# Patient Record
Sex: Male | Born: 1950 | Race: White | Hispanic: No | Marital: Married | Smoking: Former smoker
Health system: Southern US, Community
[De-identification: ages and names within clinical notes are randomized; demographics above are authoritative.]

## PROBLEM LIST (undated history)

## (undated) DIAGNOSIS — T7840XA Allergy, unspecified, initial encounter: Secondary | ICD-10-CM

## (undated) DIAGNOSIS — C4491 Basal cell carcinoma of skin, unspecified: Secondary | ICD-10-CM

## (undated) DIAGNOSIS — E785 Hyperlipidemia, unspecified: Secondary | ICD-10-CM

## (undated) DIAGNOSIS — M1990 Unspecified osteoarthritis, unspecified site: Secondary | ICD-10-CM

## (undated) DIAGNOSIS — N529 Male erectile dysfunction, unspecified: Secondary | ICD-10-CM

## (undated) DIAGNOSIS — I10 Essential (primary) hypertension: Secondary | ICD-10-CM

## (undated) HISTORY — DX: Essential (primary) hypertension: I10

## (undated) HISTORY — PX: SPINE SURGERY: SHX786

## (undated) HISTORY — DX: Male erectile dysfunction, unspecified: N52.9

## (undated) HISTORY — DX: Allergy, unspecified, initial encounter: T78.40XA

## (undated) HISTORY — DX: Basal cell carcinoma of skin, unspecified: C44.91

## (undated) HISTORY — DX: Hyperlipidemia, unspecified: E78.5

## (undated) HISTORY — PX: KNEE SURGERY: SHX244

## (undated) HISTORY — PX: OTHER SURGICAL HISTORY: SHX169

## (undated) HISTORY — PX: LUMBAR DISC SURGERY: SHX700

## (undated) HISTORY — DX: Unspecified osteoarthritis, unspecified site: M19.90

---

## 1997-05-07 ENCOUNTER — Ambulatory Visit (HOSPITAL_BASED_OUTPATIENT_CLINIC_OR_DEPARTMENT_OTHER): Admission: RE | Admit: 1997-05-07 | Discharge: 1997-05-07 | Payer: Self-pay | Admitting: *Deleted

## 1998-01-14 ENCOUNTER — Emergency Department (HOSPITAL_COMMUNITY): Admission: EM | Admit: 1998-01-14 | Discharge: 1998-01-14 | Payer: Self-pay | Admitting: Emergency Medicine

## 1998-01-15 ENCOUNTER — Encounter: Payer: Self-pay | Admitting: Emergency Medicine

## 2002-03-02 ENCOUNTER — Emergency Department (HOSPITAL_COMMUNITY): Admission: EM | Admit: 2002-03-02 | Discharge: 2002-03-02 | Payer: Self-pay | Admitting: Emergency Medicine

## 2011-04-30 ENCOUNTER — Ambulatory Visit (INDEPENDENT_AMBULATORY_CARE_PROVIDER_SITE_OTHER): Payer: 59 | Admitting: Internal Medicine

## 2011-04-30 VITALS — BP 166/89 | HR 67 | Temp 98.6°F | Resp 16 | Ht 75.0 in | Wt 230.0 lb

## 2011-04-30 DIAGNOSIS — IMO0001 Reserved for inherently not codable concepts without codable children: Secondary | ICD-10-CM

## 2011-04-30 DIAGNOSIS — R030 Elevated blood-pressure reading, without diagnosis of hypertension: Secondary | ICD-10-CM

## 2011-04-30 DIAGNOSIS — N529 Male erectile dysfunction, unspecified: Secondary | ICD-10-CM

## 2011-04-30 DIAGNOSIS — R635 Abnormal weight gain: Secondary | ICD-10-CM

## 2011-04-30 LAB — POCT CBC
Granulocyte percent: 62.8 %G (ref 37–80)
HCT, POC: 49 % (ref 43.5–53.7)
Hemoglobin: 16.1 g/dL (ref 14.1–18.1)
Lymph, poc: 2 (ref 0.6–3.4)
MCH, POC: 31.4 pg — AB (ref 27–31.2)
MCHC: 32.9 g/dL (ref 31.8–35.4)
MCV: 95.6 fL (ref 80–97)
MID (cbc): 0.6 (ref 0–0.9)
MPV: 8.2 fL (ref 0–99.8)
POC Granulocyte: 4.3 (ref 2–6.9)
POC LYMPH PERCENT: 28.7 %L (ref 10–50)
POC MID %: 8.5 %M (ref 0–12)
Platelet Count, POC: 273 10*3/uL (ref 142–424)
RBC: 5.13 M/uL (ref 4.69–6.13)
RDW, POC: 13.2 %
WBC: 6.9 10*3/uL (ref 4.6–10.2)

## 2011-04-30 LAB — IFOBT (OCCULT BLOOD): IFOBT: NEGATIVE

## 2011-04-30 MED ORDER — SILDENAFIL CITRATE 100 MG PO TABS
50.0000 mg | ORAL_TABLET | Freq: Every day | ORAL | Status: DC | PRN
Start: 2011-04-30 — End: 2012-03-11

## 2011-04-30 NOTE — Progress Notes (Signed)
Subjective:    Patient ID: Joshua Levine, male    DOB: 06/04/1950, 61 y.o.   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  HENT: Negative.   Eyes: Negative.   Respiratory: Negative.   Cardiovascular: Negative.   Gastrointestinal: Negative.   Genitourinary: Negative.        Nocturia one or 2 times per night No flow change  Musculoskeletal: Negative.   Skin: Negative.   Neurological: Negative.   Hematological: Negative.   Psychiatric/Behavioral: Positive for dysphoric mood. Negative for suicidal ideas and sleep disturbance. The patient is not nervous/anxious.       Family history:  Father diagnosed with prostate cancer in his 58s Objective:   Physical Exam  Constitutional: He is oriented to person, place, and time. He appears well-developed and well-nourished.  Neck: No thyromegaly present.  Cardiovascular: Normal rate, regular rhythm, normal heart sounds and intact distal pulses.   No murmur heard. Pulmonary/Chest: Effort normal and breath sounds normal.  Genitourinary: Rectum normal, prostate normal and penis normal.       No testicular atrophy Prostate is smooth and symmetrical  Lymphadenopathy:    He has no cervical adenopathy.  Neurological: He is alert and oriented to person, place, and time. No cranial nerve deficit.  Psychiatric: He has a  normal mood and affect. His behavior is normal. Judgment and thought content normal.      Results for orders placed in visit on 04/30/11  POCT CBC      Component Value Range   WBC 6.9  4.6 - 10.2 (K/uL)   Lymph, poc 2.0  0.6 - 3.4    POC LYMPH PERCENT 28.7  10 - 50 (%L)   MID (cbc) 0.6  0 - 0.9    POC MID % 8.5  0 - 12 (%M)   POC Granulocyte 4.3  2 - 6.9    Granulocyte percent 62.8  37 - 80 (%G)   RBC 5.13  4.69 - 6.13 (M/uL)   Hemoglobin 16.1  14.1 - 18.1 (g/dL)   HCT, POC 13.0  86.5 - 53.7 (%)   MCV 95.6  80 - 97 (fL)   MCH, POC 31.4 (*) 27 - 31.2 (pg)   MCHC 32.9  31.8 - 35.4 (g/dL)   RDW, POC 78.4     Platelet Count, POC 273  142 - 424 (K/uL)   MPV 8.2  0 - 99.8 (fL)  Hemoccult=Negative     Assessment & Plan:  Problem #1 erectile dysfunction Problem #2 recent weight gain Problem #3 increased blood pressure without diagnosis of hypertension #4 mild overweight Problem #5 dysthymia  Plan Metabolic evaluation for Risk factors Trial of Viagra 100 mg #511 refills 1/4-1/2 to one Notify labs and followup Will need outside blood pressures Refer to psychologist Karmen Bongo Needs to consider followup for all the other health parameters for his age including  colonoscopy #1

## 2011-04-30 NOTE — Progress Notes (Signed)
Addended by: Tonye Pearson on: 04/30/2011 04:38 PM   Modules accepted: Level of Service

## 2011-05-01 LAB — COMPREHENSIVE METABOLIC PANEL
ALT: 28 U/L (ref 0–53)
AST: 25 U/L (ref 0–37)
Albumin: 4.9 g/dL (ref 3.5–5.2)
Alkaline Phosphatase: 79 U/L (ref 39–117)
BUN: 19 mg/dL (ref 6–23)
CO2: 27 mEq/L (ref 19–32)
Calcium: 9.8 mg/dL (ref 8.4–10.5)
Chloride: 104 mEq/L (ref 96–112)
Creat: 0.96 mg/dL (ref 0.50–1.35)
Glucose, Bld: 97 mg/dL (ref 70–99)
Potassium: 4.8 mEq/L (ref 3.5–5.3)
Sodium: 138 mEq/L (ref 135–145)
Total Bilirubin: 0.5 mg/dL (ref 0.3–1.2)
Total Protein: 7.2 g/dL (ref 6.0–8.3)

## 2011-05-01 LAB — TSH: TSH: 2.502 u[IU]/mL (ref 0.350–4.500)

## 2011-05-01 LAB — PSA: PSA: 0.5 ng/mL (ref <=4.00)

## 2011-05-01 LAB — TESTOSTERONE: Testosterone: 471.13 ng/dL (ref 250–890)

## 2011-05-01 LAB — LIPID PANEL: Cholesterol: 229 mg/dL — ABNORMAL HIGH (ref 0–200)

## 2011-05-03 ENCOUNTER — Encounter: Payer: Self-pay | Admitting: Internal Medicine

## 2011-11-19 ENCOUNTER — Ambulatory Visit (INDEPENDENT_AMBULATORY_CARE_PROVIDER_SITE_OTHER): Payer: 59 | Admitting: Family Medicine

## 2011-11-19 VITALS — BP 142/98 | HR 70 | Temp 98.1°F | Resp 18 | Ht 75.0 in | Wt 237.8 lb

## 2011-11-19 DIAGNOSIS — E785 Hyperlipidemia, unspecified: Secondary | ICD-10-CM

## 2011-11-19 DIAGNOSIS — I10 Essential (primary) hypertension: Secondary | ICD-10-CM

## 2011-11-19 DIAGNOSIS — Z23 Encounter for immunization: Secondary | ICD-10-CM

## 2011-11-19 MED ORDER — HYDROCHLOROTHIAZIDE 12.5 MG PO TABS: 12.5000 mg | ORAL_TABLET | Freq: Every day | ORAL | Status: DC

## 2011-11-19 NOTE — Patient Instructions (Addendum)
Let's increase your niacin to help improve your triglycerides.   I think you are taking the extended release form. You can go up to 2000 mg at bedtime.  Increase by 500 mg every 4 weeks as tolerated.  An aspirin a day would also be a good idea.  If you take aspirin about 30 minutes prior to your niacin it should reduce any flushing that you might experience.    Work on increasing your exercise and losing a few pounds.  Please give me a call with an update regarding your blood pressure in about 2 weeks

## 2011-11-19 NOTE — Progress Notes (Signed)
[facility] [address], [place] [address] [phone]  Date:  11/19/2011   Name:  Joshua Levine   DOB:  11-Jun-1950   MRN:  [id number]  PCP:  No primary provider on file.    Chief Complaint: Hypertension and Dizziness   History of Present Illness:  Joshua Levine is a 61 y.o. very pleasant male patient who presents with the following:  He is here today to evaluate BP.  He has been told over the years that his BP has been high.  He was on a diuretic at some point in the past.  He took this about 10 years ago and did ok.   His BP has been running about 140s/ 90s when he checks it at home  He had labs done in March of this year.  His triglycerides are high.  He takes niacin OTC and has done so for some time.  He has not been exercising as much as he would like- he knows he needs to increase this.    History of CAD on his mother's side of the family.  She had a fatal MI at age 48   There is no problem list on file for this patient.   No past medical history on file.  No past surgical history on file.  History  Substance Use Topics  . Smoking status: Former Games developer  . Smokeless tobacco: Not on file  . Alcohol Use: Not on file    No family history on file.  No Known Allergies  Medication list has been reviewed and updated.  Current Outpatient Prescriptions on File Prior to Visit  Medication Sig Dispense Refill  . Ascorbic Acid (VITAMIN C) 100 MG tablet Take 100 mg by mouth daily.      Marland Kitchen glucosamine-chondroitin 500-400 MG tablet Take 1 tablet by mouth 3 (three) times daily.      . niacin (NIASPAN) 1000 MG CR tablet Take 1,000 mg by mouth at bedtime.      Marland Kitchen APAP-Isometheptene-Dichloral (AMIDRINE PO) Take by mouth.      . sildenafil (VIAGRA) 100 MG tablet Take 0.5-1 tablets (50-100 mg total) by mouth daily as needed for erectile dysfunction.  5 tablet  11    Review of Systems:  As per HPI- otherwise negative.   Physical Examination: Filed  Vitals:   11/19/11 0958  BP: 142/98  Pulse: 70  Temp: 98.1 F (36.7 C)  Resp: 18   Filed Vitals:   11/19/11 0958  Height: 6\' 3"  (1.905 m)  Weight: 237 lb 12.8 oz (107.865 kg)   Body mass index is 29.72 kg/(m^2). Ideal Body Weight: Weight in (lb) to have BMI = 25: 199.6   GEN: WDWN, NAD, Non-toxic, A & O x 3, mild overweight HEENT: Atraumatic, Normocephalic. Neck supple. No masses, No LAD. Ears and Nose: No external deformity. CV: RRR, No M/G/R. No JVD. No thrill. No extra heart sounds. PULM: CTA B, no wheezes, crackles, rhonchi. No retractions. No resp. distress. No accessory muscle use. ABD: S, NT, ND, +BS. No rebound. No HSM. EXTR: No c/c/e NEURO Normal gait.  PSYCH: Normally interactive. Conversant. Not depressed or anxious appearing.  Calm demeanor.   EKG: NSR, no ST elevation or depression Assessment and Plan: 1. HTN (hypertension)  EKG 12-Lead, hydrochlorothiazide (HYDRODIURIL) 12.5 MG tablet  2. Hyperlipidemia     Will start treatment for HTN as above.  If he has ED with this change to ACE.  Discussed tapering up his niacin.  He will work on this, but we may need to add a statin in the future.  Will make further plans when he calls with BP update.  EKG reassuring    COPLAND,JESSICA, MD

## 2011-12-07 ENCOUNTER — Telehealth: Payer: Self-pay

## 2011-12-07 NOTE — Telephone Encounter (Signed)
Needs ov for HTN recheck and we can deal with his other problems at that time.

## 2011-12-07 NOTE — Telephone Encounter (Signed)
After patients OV - he got a bp monitor and script.  Fisrt reading was 175/104 Since taking the meds is is 165 to 155 over 100  (thinks he may need med adjustment)  Also, constipated,  ED - had prior to taking meds  Please call [phone]

## 2011-12-07 NOTE — Telephone Encounter (Signed)
patient notified and voiced understanding. 

## 2011-12-09 ENCOUNTER — Ambulatory Visit (INDEPENDENT_AMBULATORY_CARE_PROVIDER_SITE_OTHER): Payer: Managed Care, Other (non HMO) | Admitting: Family Medicine

## 2011-12-09 ENCOUNTER — Encounter: Payer: Self-pay | Admitting: Family Medicine

## 2011-12-09 VITALS — BP 155/90 | HR 74 | Temp 98.4°F | Resp 13 | Ht 75.0 in | Wt 241.2 lb

## 2011-12-09 DIAGNOSIS — I10 Essential (primary) hypertension: Secondary | ICD-10-CM

## 2011-12-09 DIAGNOSIS — E785 Hyperlipidemia, unspecified: Secondary | ICD-10-CM

## 2011-12-09 DIAGNOSIS — J309 Allergic rhinitis, unspecified: Secondary | ICD-10-CM

## 2011-12-09 DIAGNOSIS — N529 Male erectile dysfunction, unspecified: Secondary | ICD-10-CM

## 2011-12-09 MED ORDER — LISINOPRIL 10 MG PO TABS: 10.0000 mg | ORAL_TABLET | Freq: Every day | ORAL | Status: DC

## 2011-12-09 MED ORDER — FLUTICASONE PROPIONATE 50 MCG/ACT NA SUSP: 2.0000 | Freq: Every day | NASAL | Status: DC

## 2011-12-09 MED ORDER — HYDROCHLOROTHIAZIDE 25 MG PO TABS: 25.0000 mg | ORAL_TABLET | Freq: Every day | ORAL | Status: DC

## 2011-12-09 NOTE — Patient Instructions (Signed)

## 2011-12-09 NOTE — Progress Notes (Signed)
[facility]:  Office Visit  Chief Complaint:  Chief Complaint  Patient presents with  . Hypertension    HPI: Joshua Levine is a 61 y.o. male who complains of  Recheck for BP. Recently was put on  HCTZ for elevated BP. After BP meds his BP readings at home have been high; 150-160s/90. He does have a significant family h/o heart disease. He is a non smoker. He does not watch his salt intake. He has not had insurance for a while. He has hyperlipidemia and has been on OTC niacin. I will start him on low dose statin.   Past Medical History  Diagnosis Date  . Hypertension   . Erectile dysfunction   . Allergy   . Hyperlipidemia    Past Surgical History  Procedure Date  . Spine surgery    History   Social History  . Marital Status: Married    Spouse Name: N/A    Number of Children: N/A  . Years of Education: N/A   Social History Main Topics  . Smoking status: Former Games developer  . Smokeless tobacco: None  . Alcohol Use: No  . Drug Use: No  . Sexually Active: Yes   Other Topics Concern  . None   Social History Narrative  . None   Family History  Problem Relation Age of Onset  . Heart disease Mother   . Parkinson's disease Father   . Alzheimer's disease Father    No Known Allergies Prior to Admission medications   Medication Sig Start Date End Date Taking? Authorizing Provider  APAP-Isometheptene-Dichloral (AMIDRINE PO) Take by mouth.    Historical Provider, MD  Ascorbic Acid (VITAMIN C) 100 MG tablet Take 100 mg by mouth daily.    Historical Provider, MD  glucosamine-chondroitin 500-400 MG tablet Take 1 tablet by mouth 3 (three) times daily.    Historical Provider, MD  hydrochlorothiazide (HYDRODIURIL) 12.5 MG tablet Take 1 tablet (12.5 mg total) by mouth daily. 11/19/11   Gwenlyn Found Copland, MD  niacin (NIASPAN) 1000 MG CR tablet Take 1,000 mg by mouth at bedtime.    Historical Provider, MD  sildenafil (VIAGRA) 100 MG tablet Take 0.5-1 tablets (50-100  mg total) by mouth daily as needed for erectile dysfunction. 04/30/11 05/30/11  Tonye Pearson, MD     ROS: The patient denies fevers, chills, night sweats, unintentional weight loss, chest pain, palpitations, wheezing, dyspnea on exertion, nausea, vomiting, abdominal pain, dysuria, hematuria, melena, numbness, weakness, or tingling.   All other systems have been reviewed and were otherwise negative with the exception of those mentioned in the HPI and as above.    PHYSICAL EXAM: Filed Vitals:   12/09/11 1409  BP: 155/90  Pulse: 74  Temp: 98.4 F (36.9 C)  Resp: 13   Filed Vitals:   12/09/11 1409  Height: 6\' 3"  (1.905 m)  Weight: 241 lb 3.2 oz (109.408 kg)   Body mass index is 30.15 kg/(m^2).  General: Alert, no acute distress HEENT:  Normocephalic, atraumatic, oropharynx patent.  Cardiovascular:  Regular rate and rhythm, no rubs murmurs or gallops.  No Carotid bruits, radial pulse intact. No pedal edema.  Respiratory: Clear to auscultation bilaterally.  No wheezes, rales, or rhonchi.  No cyanosis, no use of accessory musculature GI: No organomegaly, abdomen is soft and non-tender, positive bowel sounds.  No masses. Skin: No rashes. Neurologic: Facial musculature symmetric. Psychiatric: Patient is appropriate throughout our interaction. Lymphatic: No cervical lymphadenopathy Musculoskeletal: Gait intact.   LABS: Results  for orders placed in visit on 04/30/11  POCT CBC      Component Value Range   WBC 6.9  4.6 - 10.2 K/uL   Lymph, poc 2.0  0.6 - 3.4   POC LYMPH PERCENT 28.7  10 - 50 %L   MID (cbc) 0.6  0 - 0.9   POC MID % 8.5  0 - 12 %M   POC Granulocyte 4.3  2 - 6.9   Granulocyte percent 62.8  37 - 80 %G   RBC 5.13  4.69 - 6.13 M/uL   Hemoglobin 16.1  14.1 - 18.1 g/dL   HCT, POC 16.1  09.6 - 53.7 %   MCV 95.6  80 - 97 fL   MCH, POC 31.4 (*) 27 - 31.2 pg   MCHC 32.9  31.8 - 35.4 g/dL   RDW, POC 04.5     Platelet Count, POC 273  142 - 424 K/uL   MPV 8.2  0 - 99.8  fL  PSA      Component Value Range   PSA 0.50  <=4.00 ng/mL  COMPREHENSIVE METABOLIC PANEL      Component Value Range   Sodium 138  135 - 145 mEq/L   Potassium 4.8  3.5 - 5.3 mEq/L   Chloride 104  96 - 112 mEq/L   CO2 27  19 - 32 mEq/L   Glucose, Bld 97  70 - 99 mg/dL   BUN 19  6 - 23 mg/dL   Creat 4.09  8.11 - 9.14 mg/dL   Total Bilirubin 0.5  0.3 - 1.2 mg/dL   Alkaline Phosphatase 79  39 - 117 U/L   AST 25  0 - 37 U/L   ALT 28  0 - 53 U/L   Total Protein 7.2  6.0 - 8.3 g/dL   Albumin 4.9  3.5 - 5.2 g/dL   Calcium 9.8  8.4 - 78.2 mg/dL  TSH      Component Value Range   TSH 2.502  0.350 - 4.500 uIU/mL  TESTOSTERONE      Component Value Range   Testosterone 471.13  250 - 890 ng/dL  LIPID PANEL      Component Value Range   Cholesterol 229 (*) 0 - 200 mg/dL   Triglycerides 956 (*) <150 mg/dL   HDL 39 (*) >21 mg/dL   Total CHOL/HDL Ratio 5.9     VLDL 79 (*) 0 - 40 mg/dL   LDL Cholesterol 308 (*) 0 - 99 mg/dL  IFOBT (OCCULT BLOOD)      Component Value Range   IFOBT Negative       EKG/XRAY:   Primary read interpreted by Dr. Conley Rolls at [facility].   ASSESSMENT/PLAN: Encounter Diagnoses  Name Primary?  . HTN (hypertension) Yes  . Allergic rhinitis   . Hyperlipidemia    Increase his HTN medications from HCTZ  1.5 mg to 5 mg and also add low dose lisinopril 10 mg daily. Monitor BP < 140/90 is ideal. Repeat labs and recheck BP in 3 month. SEs d/w patient Add low dose Crestor for hyperlipidemia, f/u in 3 months for fasting lipids Rx flonase for  Allergies    LE, THAO PHUONG, DO 12/10/2011 7:43 AM

## 2011-12-10 ENCOUNTER — Encounter: Payer: Self-pay | Admitting: Family Medicine

## 2011-12-10 DIAGNOSIS — I10 Essential (primary) hypertension: Secondary | ICD-10-CM | POA: Insufficient documentation

## 2011-12-10 DIAGNOSIS — E785 Hyperlipidemia, unspecified: Secondary | ICD-10-CM | POA: Insufficient documentation

## 2011-12-10 DIAGNOSIS — N529 Male erectile dysfunction, unspecified: Secondary | ICD-10-CM | POA: Insufficient documentation

## 2011-12-10 MED ORDER — ROSUVASTATIN CALCIUM 20 MG PO TABS: 20.0000 mg | ORAL_TABLET | Freq: Every day | ORAL | Status: DC

## 2012-02-01 ENCOUNTER — Other Ambulatory Visit: Payer: Self-pay | Admitting: Radiology

## 2012-02-01 DIAGNOSIS — I10 Essential (primary) hypertension: Secondary | ICD-10-CM

## 2012-02-01 DIAGNOSIS — E785 Hyperlipidemia, unspecified: Secondary | ICD-10-CM

## 2012-02-01 MED ORDER — HYDROCHLOROTHIAZIDE 25 MG PO TABS: 25.0000 mg | ORAL_TABLET | Freq: Every day | ORAL | Status: DC

## 2012-02-01 MED ORDER — ROSUVASTATIN CALCIUM 20 MG PO TABS: 20.0000 mg | ORAL_TABLET | Freq: Every day | ORAL | Status: DC

## 2012-02-01 MED ORDER — LISINOPRIL 10 MG PO TABS: 10.0000 mg | ORAL_TABLET | Freq: Every day | ORAL | Status: DC

## 2012-02-01 NOTE — Telephone Encounter (Signed)
Have gotten request to change Rx from 30day to 90 day supply, these have already been authorized by Dr Conley Rolls. resubmitted

## 2012-03-11 ENCOUNTER — Ambulatory Visit (INDEPENDENT_AMBULATORY_CARE_PROVIDER_SITE_OTHER): Payer: Managed Care, Other (non HMO) | Admitting: Family Medicine

## 2012-03-11 VITALS — BP 146/90 | HR 93 | Temp 98.0°F | Resp 16 | Ht 75.0 in | Wt 228.0 lb

## 2012-03-11 DIAGNOSIS — R6882 Decreased libido: Secondary | ICD-10-CM

## 2012-03-11 DIAGNOSIS — N529 Male erectile dysfunction, unspecified: Secondary | ICD-10-CM

## 2012-03-11 DIAGNOSIS — Z79899 Other long term (current) drug therapy: Secondary | ICD-10-CM

## 2012-03-11 DIAGNOSIS — E785 Hyperlipidemia, unspecified: Secondary | ICD-10-CM

## 2012-03-11 DIAGNOSIS — I10 Essential (primary) hypertension: Secondary | ICD-10-CM

## 2012-03-11 LAB — COMPREHENSIVE METABOLIC PANEL
ALT: 34 U/L (ref 0–53)
AST: 29 U/L (ref 0–37)
Albumin: 5 g/dL (ref 3.5–5.2)
Alkaline Phosphatase: 76 U/L (ref 39–117)
BUN: 15 mg/dL (ref 6–23)
Potassium: 4.1 mEq/L (ref 3.5–5.3)
Sodium: 137 mEq/L (ref 135–145)

## 2012-03-11 LAB — POCT CBC
Granulocyte percent: 54.6 %G (ref 37–80)
Hemoglobin: 15.6 g/dL (ref 14.1–18.1)
Lymph, poc: 1.8 (ref 0.6–3.4)
MCH, POC: 31.9 pg — AB (ref 27–31.2)
MCHC: 32.9 g/dL (ref 31.8–35.4)
MCV: 97 fL (ref 80–97)
POC Granulocyte: 2.8 (ref 2–6.9)
Platelet Count, POC: 233 10*3/uL (ref 142–424)
RBC: 4.89 M/uL (ref 4.69–6.13)
RDW, POC: 12.9 %

## 2012-03-11 LAB — LIPID PANEL: LDL Cholesterol: 57 mg/dL (ref 0–99)

## 2012-03-11 MED ORDER — HYDROCHLOROTHIAZIDE 25 MG PO TABS: 25.0000 mg | ORAL_TABLET | Freq: Every day | ORAL | Status: DC

## 2012-03-11 MED ORDER — SILDENAFIL CITRATE 100 MG PO TABS: 50.0000 mg | ORAL_TABLET | Freq: Every day | ORAL | Status: DC | PRN

## 2012-03-11 MED ORDER — ROSUVASTATIN CALCIUM 20 MG PO TABS: 20.0000 mg | ORAL_TABLET | Freq: Every day | ORAL | Status: DC

## 2012-03-11 MED ORDER — LISINOPRIL 20 MG PO TABS: 20.0000 mg | ORAL_TABLET | Freq: Every day | ORAL | Status: DC

## 2012-03-11 NOTE — Progress Notes (Signed)
Subjective:    Patient ID: Joshua Levine, male    DOB: Nov 04, 1950, 62 y.o.   MRN: [id number]  HPI  Checking bp at home 125 to high 140s/ mid 90s.  Wanted a rx for viagra last yr, ED problems worse.  Viagra helps.  Thinks he has suppressed libido.  Last yr was taking a testosterone otc supplement -  He does get winded when walking hard - not unchanged. Mother dec of MI - first developed at 36 y.o. - had likely had CP for years. -   Past Medical History  Diagnosis Date  . Hypertension   . Erectile dysfunction   . Allergy   . Hyperlipidemia    Current Outpatient Prescriptions on File Prior to Visit  Medication Sig Dispense Refill  . Ascorbic Acid (VITAMIN C) 100 MG tablet Take 100 mg by mouth daily.      . fluticasone (FLONASE) 50 MCG/ACT nasal spray Place 2 sprays into the nose daily.  16 g  6  . glucosamine-chondroitin 500-400 MG tablet Take 1 tablet by mouth 3 (three) times daily.      . hydrochlorothiazide (HYDRODIURIL) 25 MG tablet Take 1 tablet (25 mg total) by mouth daily.  90 tablet  1  . lisinopril (PRINIVIL,ZESTRIL) 10 MG tablet Take 1 tablet (10 mg total) by mouth daily. Patient due for labs in February 2014  90 tablet  1  . niacin (NIASPAN) 1000 MG CR tablet Take 1,000 mg by mouth at bedtime.      . rosuvastatin (CRESTOR) 20 MG tablet Take 1 tablet (20 mg total) by mouth at bedtime.  90 tablet  1  . sildenafil (VIAGRA) 100 MG tablet Take 0.5-1 tablets (50-100 mg total) by mouth daily as needed for erectile dysfunction.  5 tablet  11   No current facility-administered medications on file prior to visit.   No Known Allergies  Review of Systems  Constitutional: Negative for fever and chills.  Eyes: Negative for visual disturbance.  Respiratory: Negative for shortness of breath.   Cardiovascular: Negative for chest pain and leg swelling.  Endocrine:       Decreased libido  Neurological: Negative for dizziness, syncope, facial asymmetry, weakness, light-headedness and  headaches.     BP 146/90  Pulse 93  Temp(Src) 98 F (36.7 C)  Resp 16  Ht 6\' 3"  (1.905 m)  Wt 228 lb (103.42 kg)  BMI 28.5 kg/m2  SpO2 99% Objective:   Physical Exam  Constitutional: He is oriented to person, place, and time. He appears well-developed and well-nourished. No distress.  HENT:  Head: Normocephalic and atraumatic.  Eyes: Conjunctivae are normal. Pupils are equal, round, and reactive to light. No scleral icterus.  Neck: Normal range of motion. Neck supple. No thyromegaly present.  Cardiovascular: Normal rate, regular rhythm, normal heart sounds and intact distal pulses.   Pulmonary/Chest: Effort normal and breath sounds normal. No respiratory distress.  Musculoskeletal: He exhibits no edema.  Lymphadenopathy:    He has no cervical adenopathy.  Neurological: He is alert and oriented to person, place, and time. He displays normal reflexes. Gait normal.  Reflex Scores:      Patellar reflexes are 2+ on the right side and 2+ on the left side. Skin: Skin is warm and dry. He is not diaphoretic.  Psychiatric: He has a normal mood and affect. His behavior is normal.          Assessment & Plan:  1. HTN - increase lisinopril from 10 to 20. Cont hctz  25mg . If pt does well at lisinopril 20 with BP at goal - rec changing to combined tab of lisinopril-hctz 20/25.  Recheck bmp again at f/u.  Rec pt that he consider referral for exercise stress test - he will check cost w/ insurance and consider. 2. HPL - started on crestor 20mg  after last lipids - check lfts and recheck lipids. Will need [place] done - risk factors of +FHx, HTN, age so goal LDL likely <130 w/ non-hdl <160 3. ED - refill viagra and recheck testosterone since pt has been off otc test supp. Recheck psa and tsh 4. HM - pt plans to make appt for CPE soon Recheck chronic medical problems in 6 mos.  Meds ordered this encounter  Medications  . sildenafil (VIAGRA) 100 MG tablet    Sig: Take 0.5-1 tablets (50-100 mg  total) by mouth daily as needed for erectile dysfunction.    Dispense:  5 tablet    Refill:  11  . rosuvastatin (CRESTOR) 20 MG tablet    Sig: Take 1 tablet (20 mg total) by mouth at bedtime.    Dispense:  90 tablet    Refill:  1  . lisinopril (PRINIVIL,ZESTRIL) 20 MG tablet    Sig: Take 1 tablet (20 mg total) by mouth daily.    Dispense:  90 tablet    Refill:  1  . hydrochlorothiazide (HYDRODIURIL) 25 MG tablet    Sig: Take 1 tablet (25 mg total) by mouth daily.    Dispense:  90 tablet    Refill:  1    Patient due for labs Feb 2014

## 2012-03-11 NOTE — Patient Instructions (Signed)

## 2012-03-12 ENCOUNTER — Other Ambulatory Visit: Payer: Self-pay | Admitting: Family Medicine

## 2012-03-12 DIAGNOSIS — E785 Hyperlipidemia, unspecified: Secondary | ICD-10-CM

## 2012-03-12 LAB — TSH: TSH: 2.355 u[IU]/mL (ref 0.350–4.500)

## 2012-03-12 LAB — PSA: PSA: 0.47 ng/mL (ref <=4.00)

## 2012-03-12 MED ORDER — PRAVASTATIN SODIUM 40 MG PO TABS: 40.0000 mg | ORAL_TABLET | Freq: Every day | ORAL | Status: DC

## 2012-07-28 ENCOUNTER — Ambulatory Visit (INDEPENDENT_AMBULATORY_CARE_PROVIDER_SITE_OTHER): Payer: Managed Care, Other (non HMO) | Admitting: Family Medicine

## 2012-07-28 VITALS — BP 137/87 | HR 69 | Temp 97.6°F | Resp 16 | Ht 75.0 in | Wt 231.4 lb

## 2012-07-28 DIAGNOSIS — E785 Hyperlipidemia, unspecified: Secondary | ICD-10-CM

## 2012-07-28 DIAGNOSIS — N529 Male erectile dysfunction, unspecified: Secondary | ICD-10-CM

## 2012-07-28 DIAGNOSIS — J309 Allergic rhinitis, unspecified: Secondary | ICD-10-CM

## 2012-07-28 DIAGNOSIS — R05 Cough: Secondary | ICD-10-CM

## 2012-07-28 DIAGNOSIS — R059 Cough, unspecified: Secondary | ICD-10-CM

## 2012-07-28 DIAGNOSIS — I10 Essential (primary) hypertension: Secondary | ICD-10-CM

## 2012-07-28 MED ORDER — LISINOPRIL-HYDROCHLOROTHIAZIDE 20-25 MG PO TABS: 1.0000 | ORAL_TABLET | Freq: Every day | ORAL | Status: DC

## 2012-07-28 MED ORDER — PRAVASTATIN SODIUM 40 MG PO TABS: 40.0000 mg | ORAL_TABLET | Freq: Every day | ORAL | Status: DC

## 2012-07-28 MED ORDER — FLUTICASONE PROPIONATE 50 MCG/ACT NA SUSP: 2.0000 | Freq: Every day | NASAL | Status: DC

## 2012-07-28 NOTE — Progress Notes (Signed)
Subjective:    Patient ID: Joshua Levine, male    DOB: 01-01-1951, 62 y.o.   MRN: [id number]  HPI Joshua Levine is a 62 y.o. male Hx of HTN - last ov in 2/14 with Dr. Clelia Croft - increased lisinopril to 20mg  then. Continued hctz at 25mg   -- no recent outside BP's. Has not checked into cost of stress testing.    Hyperlipidemia - takes niaspan and pravachol. Last lipids wnl below. No new myalgias.  ED - takes viagra intermittently - 1/2 pill about once per week. otc testosterone supplement.  Occasional flushing with viagra.   Cough - persistent low grade cough, for weeks. Rarely productive. No fever.  Hx of sinus congestion with PND - takes flonase NS 1spr/nost BID.  claritin off and on. Dust allergy, pollen.  No chest pain. No dyspnea. ZY:SAYT  Does sniff in flonase after spray.     Results for orders placed in visit on 03/11/12  LIPID PANEL      Result Value Range   Cholesterol 120  0 - 200 mg/dL   Triglycerides 016  <010 mg/dL   HDL 41  >93 mg/dL   Total CHOL/HDL Ratio 2.9     VLDL 22  0 - 40 mg/dL   LDL Cholesterol 57  0 - 99 mg/dL  PSA      Result Value Range   PSA 0.47  <=4.00 ng/mL  TSH      Result Value Range   TSH 2.355  0.350 - 4.500 uIU/mL  COMPREHENSIVE METABOLIC PANEL      Result Value Range   Sodium 137  135 - 145 mEq/L   Potassium 4.1  3.5 - 5.3 mEq/L   Chloride 100  96 - 112 mEq/L   CO2 27  19 - 32 mEq/L   Glucose, Bld 106 (*) 70 - 99 mg/dL   BUN 15  6 - 23 mg/dL   Creat 2.35  5.73 - 2.20 mg/dL   Total Bilirubin 0.6  0.3 - 1.2 mg/dL   Alkaline Phosphatase 76  39 - 117 U/L   AST 29  0 - 37 U/L   ALT 34  0 - 53 U/L   Total Protein 7.2  6.0 - 8.3 g/dL   Albumin 5.0  3.5 - 5.2 g/dL   Calcium 25.4  8.4 - 27.0 mg/dL  TESTOSTERONE      Result Value Range   Testosterone 357  300 - 890 ng/dL  POCT CBC      Result Value Range   WBC 5.1  4.6 - 10.2 K/uL   Lymph, poc 1.8  0.6 - 3.4   POC LYMPH PERCENT 35.4  10 - 50 %L   MID (cbc) 0.5  0 - 0.9   POC MID % 10.0  0 -  12 %M   POC Granulocyte 2.8  2 - 6.9   Granulocyte percent 54.6  37 - 80 %G   RBC 4.89  4.69 - 6.13 M/uL   Hemoglobin 15.6  14.1 - 18.1 g/dL   HCT, POC 62.3  76.2 - 53.7 %   MCV 97.0  80 - 97 fL   MCH, POC 31.9 (*) 27 - 31.2 pg   MCHC 32.9  31.8 - 35.4 g/dL   RDW, POC 83.1     Platelet Count, POC 233  142 - 424 K/uL   MPV 8.3  0 - 99.8 fL    Cough - persistent low grade cough, for weeks. Rarely productive. No fever.  Hx  of sinus congestion with PND - takes flonase NS 1spr/nost BID.  claritin off and on. Dust allergy, pollen.  No chest pain. No dyspnea. YN:WGNF    Review of Systems  Constitutional: Negative for fatigue and unexpected weight change.  Eyes: Negative for visual disturbance.  Respiratory: Negative for cough, chest tightness and shortness of breath.   Cardiovascular: Negative for chest pain, palpitations and leg swelling.  Gastrointestinal: Negative for abdominal pain and blood in stool.  Neurological: Positive for light-headedness (rarely if getting up too quick only - every few weeks. ). Negative for dizziness and headaches.       Objective:   Physical Exam  Vitals reviewed. Constitutional: He is oriented to person, place, and time. He appears well-developed and well-nourished.  HENT:  Head: Normocephalic and atraumatic.  Nose: Mucosal edema present. Right sinus exhibits no maxillary sinus tenderness and no frontal sinus tenderness. Left sinus exhibits no maxillary sinus tenderness and no frontal sinus tenderness.  Eyes: EOM are normal. Pupils are equal, round, and reactive to light.  Neck: No JVD present. Carotid bruit is not present.  Cardiovascular: Normal rate, regular rhythm and normal heart sounds.   No murmur heard. Pulmonary/Chest: Effort normal and breath sounds normal. He has no wheezes. He has no rales.  Musculoskeletal: He exhibits no edema.  Neurological: He is alert and oriented to person, place, and time.  Skin: Skin is warm and dry.  Psychiatric:  He has a normal mood and affect.       Assessment & Plan:  Joshua Levine is a 62 y.o. male Erectile dysfunction - stable - has refills of viagra.  Testosterone level normal.   Hyperlipidemia - Plan: pravastatin (PRAVACHOL) 40 MG tablet. Controlled on last labs. Cont same doses. Recommended stress testing - can discuss further at CPE next 3-6 mo.   HTN (hypertension) - Plan: lisinopril-hydrochlorothiazide (PRINZIDE,ZESTORETIC) 20-25 MG per tablet.  Controlled. Doubt ace cough - see below. Changed to combo tablet.   Allergic rhinitis - Plan: fluticasone (FLONASE) 50 MCG/ACT nasal spray, add zyrtec or claritin, and discussed correct use of flonase.   Cough - Plan: fluticasone (FLONASE) 50 MCG/ACT nasal spray - likley AR cause with PND. Doubt ace -I, but if not improving in few weeks, consider ARB trial.   Meds ordered this encounter  Medications  . pravastatin (PRAVACHOL) 40 MG tablet    Sig: Take 1 tablet (40 mg total) by mouth daily.    Dispense:  90 tablet    Refill:  1  . lisinopril-hydrochlorothiazide (PRINZIDE,ZESTORETIC) 20-25 MG per tablet    Sig: Take 1 tablet by mouth daily.    Dispense:  90 tablet    Refill:  1  . fluticasone (FLONASE) 50 MCG/ACT nasal spray    Sig: Place 2 sprays into the nose daily.    Dispense:  16 g    Refill:  6   Patient Instructions  Avoid allergens as able start zyrtec or allegra once per day.  flonase every day as discussed.  If cough not improving  In next 3-4 weeks - return as this may be due to one of your blood pressure medicines. We will schedule a physical.  Keep a record of your blood pressures outside of the office and bring them to the next office visit.

## 2012-07-28 NOTE — Patient Instructions (Signed)
Avoid allergens as able start zyrtec or allegra once per day.  flonase every day as discussed.  If cough not improving  In next 3-4 weeks - return as this may be due to one of your blood pressure medicines. We will schedule a physical.  Keep a record of your blood pressures outside of the office and bring them to the next office visit.

## 2012-08-01 ENCOUNTER — Telehealth: Payer: Self-pay | Admitting: Family Medicine

## 2012-08-01 NOTE — Telephone Encounter (Signed)
Message copied by Tilman Neat on Wed Aug 01, 2012  2:15 PM ------      Message from: Shade Flood      Created: Sat Jul 28, 2012  9:06 AM       CPE with Clelia Croft or Neva Seat in 3-6 months.  ------

## 2012-08-01 NOTE — Telephone Encounter (Signed)
Pt made appointment with Dr. Neva Seat for CPE in Oct. 2014.

## 2012-09-08 ENCOUNTER — Other Ambulatory Visit: Payer: Self-pay | Admitting: Family Medicine

## 2012-11-12 ENCOUNTER — Encounter: Payer: Self-pay | Admitting: Family Medicine

## 2012-11-12 ENCOUNTER — Ambulatory Visit (INDEPENDENT_AMBULATORY_CARE_PROVIDER_SITE_OTHER): Payer: Managed Care, Other (non HMO) | Admitting: Family Medicine

## 2012-11-12 VITALS — BP 120/70 | HR 57 | Temp 98.4°F | Resp 16 | Ht 74.5 in | Wt 232.2 lb

## 2012-11-12 DIAGNOSIS — R059 Cough, unspecified: Secondary | ICD-10-CM

## 2012-11-12 DIAGNOSIS — Z8249 Family history of ischemic heart disease and other diseases of the circulatory system: Secondary | ICD-10-CM

## 2012-11-12 DIAGNOSIS — Z0000 Encounter for general adult medical examination without abnormal findings: Secondary | ICD-10-CM

## 2012-11-12 DIAGNOSIS — I10 Essential (primary) hypertension: Secondary | ICD-10-CM

## 2012-11-12 DIAGNOSIS — R05 Cough: Secondary | ICD-10-CM

## 2012-11-12 DIAGNOSIS — E785 Hyperlipidemia, unspecified: Secondary | ICD-10-CM

## 2012-11-12 DIAGNOSIS — N529 Male erectile dysfunction, unspecified: Secondary | ICD-10-CM

## 2012-11-12 DIAGNOSIS — Z23 Encounter for immunization: Secondary | ICD-10-CM

## 2012-11-12 DIAGNOSIS — J309 Allergic rhinitis, unspecified: Secondary | ICD-10-CM

## 2012-11-12 DIAGNOSIS — R21 Rash and other nonspecific skin eruption: Secondary | ICD-10-CM

## 2012-11-12 LAB — COMPREHENSIVE METABOLIC PANEL
ALT: 32 U/L (ref 0–53)
AST: 30 U/L (ref 0–37)
Albumin: 4.6 g/dL (ref 3.5–5.2)
CO2: 25 mEq/L (ref 19–32)
Chloride: 102 mEq/L (ref 96–112)
Glucose, Bld: 87 mg/dL (ref 70–99)
Potassium: 3.6 mEq/L (ref 3.5–5.3)
Sodium: 137 mEq/L (ref 135–145)
Total Bilirubin: 0.4 mg/dL (ref 0.3–1.2)
Total Protein: 7.1 g/dL (ref 6.0–8.3)

## 2012-11-12 LAB — CBC WITH DIFFERENTIAL/PLATELET
Basophils Absolute: 0 10*3/uL (ref 0.0–0.1)
Hemoglobin: 15.8 g/dL (ref 13.0–17.0)
Lymphocytes Relative: 37 % (ref 12–46)
Lymphs Abs: 2.5 10*3/uL (ref 0.7–4.0)
Monocytes Relative: 7 % (ref 3–12)
Neutro Abs: 3.3 10*3/uL (ref 1.7–7.7)
Neutrophils Relative %: 50 % (ref 43–77)
Platelets: 229 10*3/uL (ref 150–400)
RBC: 4.91 MIL/uL (ref 4.22–5.81)
WBC: 6.6 10*3/uL (ref 4.0–10.5)

## 2012-11-12 LAB — POCT URINALYSIS DIPSTICK
Bilirubin, UA: NEGATIVE
Blood, UA: NEGATIVE
Glucose, UA: NEGATIVE
Ketones, UA: NEGATIVE
Nitrite, UA: NEGATIVE
Urobilinogen, UA: 0.2

## 2012-11-12 LAB — TSH: TSH: 2.958 u[IU]/mL (ref 0.350–4.500)

## 2012-11-12 LAB — IFOBT (OCCULT BLOOD): IFOBT: NEGATIVE

## 2012-11-12 LAB — LIPID PANEL
Cholesterol: 190 mg/dL (ref 0–200)
VLDL: 61 mg/dL — ABNORMAL HIGH (ref 0–40)

## 2012-11-12 MED ORDER — FLUTICASONE PROPIONATE 50 MCG/ACT NA SUSP: 2.0000 | Freq: Every day | NASAL | Status: DC

## 2012-11-12 MED ORDER — PRAVASTATIN SODIUM 40 MG PO TABS: 40.0000 mg | ORAL_TABLET | Freq: Every day | ORAL | Status: DC

## 2012-11-12 MED ORDER — SILDENAFIL CITRATE 100 MG PO TABS: 50.0000 mg | ORAL_TABLET | Freq: Every day | ORAL | Status: DC | PRN

## 2012-11-12 MED ORDER — LISINOPRIL-HYDROCHLOROTHIAZIDE 20-25 MG PO TABS: 1.0000 | ORAL_TABLET | Freq: Every day | ORAL | Status: DC

## 2012-11-12 MED ORDER — NIACIN ER (ANTIHYPERLIPIDEMIC) 1000 MG PO TBCR: 1000.0000 mg | EXTENDED_RELEASE_TABLET | Freq: Every day | ORAL | Status: DC

## 2012-11-12 MED ORDER — ZOSTER VACCINE LIVE 19400 UNT/0.65ML ~~LOC~~ SOLR: 0.6500 mL | Freq: Once | SUBCUTANEOUS | Status: DC

## 2012-11-12 NOTE — Progress Notes (Signed)
Subjective:    Patient ID: Joshua Levine, male    DOB: 06-22-1950, 62 y.o.   MRN: [id number]  HPI Joshua Levine is a 62 y.o. male  Here for annual exam/CPE. CPE: Colonoscopy: never had. Does not want to have colonoscopy at this paint - answered questions and addressed oncerns and advised can refer without ov if needed.  Psa: wnl in 2/14.  0.47.  Tdap: around 10 years. Will update today.  Flu vaccine - today.  Zostavax: has not had.  Dentist:last appt - 2 months ago.  Optho: no recent eval. Plans on scheduling. Wears contacts.  Advanced directives: no formal discussion - plans on discussing with family. Full code.   Fasting now - having blood drawn today.   HTN - on 20/25mg  zestoretic. See below re: cough. 4--5 cough fits per day. Usually dry, some clear mucus at times. No home BP's. No chest pains, dizziness, lightheadedness.   Hyperlipidemia - takes pravachol 40mg  qd and niapsan 1000mg  QHS. Lipids wnl in 2/14 as below. Stress testing recommended in past, and planned to check into cost. Has not looked into cost yet. RF's: age, HTN, hyperlipidemia,  FH - mom with MI at 26yo. No new myalgias.   ED - viagra intermittently - 1/2 pill about once per week.  Takes otc testosterone supplement. Occasional flushing with viagra has resolved, but occasional nasal congestion. Testosterone 357 last check as below. Still some difficulty with erections - obtaining and maintaining.  Notes morning or in middle of night erection if has had viagra. Variable results with viagra. Married 40 years, no recent concerns in marriage.  Denies extramarital activity. May need to see urologist to discuss further.   Allergic rhinitis - slight cough last ov with me in June.  Suspected AR cause, discussed flonase use, and if needed otc antihistamine.  Less cough recently - still occasional cough. Using flonase once per day per nostril. Occasional claritin - not qd. unknown if difference on claritin, but admits to chronic  allergies.   Occasional skin rash with certain foods - ? Paprika. Has not formal allergy testing recently. Would be amenable to allergist eval.   Results for orders placed in visit on 03/11/12  LIPID PANEL      Result Value Range   Cholesterol 120  0 - 200 mg/dL   Triglycerides 409  <811 mg/dL   HDL 41  >91 mg/dL   Total CHOL/HDL Ratio 2.9     VLDL 22  0 - 40 mg/dL   LDL Cholesterol 57  0 - 99 mg/dL  PSA      Result Value Range   PSA 0.47  <=4.00 ng/mL  TSH      Result Value Range   TSH 2.355  0.350 - 4.500 uIU/mL  COMPREHENSIVE METABOLIC PANEL      Result Value Range   Sodium 137  135 - 145 mEq/L   Potassium 4.1  3.5 - 5.3 mEq/L   Chloride 100  96 - 112 mEq/L   CO2 27  19 - 32 mEq/L   Glucose, Bld 106 (*) 70 - 99 mg/dL   BUN 15  6 - 23 mg/dL   Creat 4.78  2.95 - 6.21 mg/dL   Total Bilirubin 0.6  0.3 - 1.2 mg/dL   Alkaline Phosphatase 76  39 - 117 U/L   AST 29  0 - 37 U/L   ALT 34  0 - 53 U/L   Total Protein 7.2  6.0 - 8.3 g/dL   Albumin  5.0  3.5 - 5.2 g/dL   Calcium 16.1  8.4 - 09.6 mg/dL  TESTOSTERONE      Result Value Range   Testosterone 357  300 - 890 ng/dL  POCT CBC      Result Value Range   WBC 5.1  4.6 - 10.2 K/uL   Lymph, poc 1.8  0.6 - 3.4   POC LYMPH PERCENT 35.4  10 - 50 %L   MID (cbc) 0.5  0 - 0.9   POC MID % 10.0  0 - 12 %M   POC Granulocyte 2.8  2 - 6.9   Granulocyte percent 54.6  37 - 80 %G   RBC 4.89  4.69 - 6.13 M/uL   Hemoglobin 15.6  14.1 - 18.1 g/dL   HCT, POC 04.5  40.9 - 53.7 %   MCV 97.0  80 - 97 fL   MCH, POC 31.9 (*) 27 - 31.2 pg   MCHC 32.9  31.8 - 35.4 g/dL   RDW, POC 81.1     Platelet Count, POC 233  142 - 424 K/uL   MPV 8.3  0 - 99.8 fL   Past Medical History  Diagnosis Date  . Hypertension   . Erectile dysfunction   . Allergy   . Hyperlipidemia   . Arthritis    Past Surgical History  Procedure Laterality Date  . Spine surgery     No Known Allergies Prior to Admission medications   Medication Sig Start Date End Date  Taking? Authorizing Provider  Ascorbic Acid (VITAMIN C) 100 MG tablet Take 100 mg by mouth daily.   Yes Historical Provider, MD  fluticasone (FLONASE) 50 MCG/ACT nasal spray Place 2 sprays into the nose daily. 07/28/12  Yes Shade Flood, MD  glucosamine-chondroitin 500-400 MG tablet Take 1 tablet by mouth 3 (three) times daily.   Yes Historical Provider, MD  lisinopril-hydrochlorothiazide (PRINZIDE,ZESTORETIC) 20-25 MG per tablet Take 1 tablet by mouth daily. 07/28/12  Yes Shade Flood, MD  niacin (NIASPAN) 1000 MG CR tablet Take 1,000 mg by mouth at bedtime.   Yes Historical Provider, MD  pravastatin (PRAVACHOL) 40 MG tablet Take 1 tablet (40 mg total) by mouth daily. 07/28/12  Yes Shade Flood, MD  sildenafil (VIAGRA) 100 MG tablet Take 0.5-1 tablets (50-100 mg total) by mouth daily as needed for erectile dysfunction. 03/11/12 01/21/13 Yes Sherren Mocha, MD   History   Social History  . Marital Status: Married    Spouse Name: N/A    Number of Children: N/A  . Years of Education: N/A   Occupational History  . Automotive    Social History Main Topics  . Smoking status: Former Games developer  . Smokeless tobacco: Not on file  . Alcohol Use: No  . Drug Use: No  . Sexual Activity: Yes   Other Topics Concern  . Not on file   Social History Narrative   Married. Education: [employer]. Exercise: Walk 2 times a week for 45 minutes.  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   Review of Systems  Constitutional: Negative for fatigue and unexpected weight change.  Eyes: Negative for visual disturbance.  Respiratory: Negative for cough, chest tightness and shortness of breath.   Cardiovascular: Negative for chest pain, palpitations and leg swelling.  Gastrointestinal: Negative for abdominal pain and blood in stool.  Neurological: Negative for dizziness, light-headedness and headaches.  13 point review of systems per patient health survey noted.  Negative other than as indicated on reviewed nursing note  or above.       Objective:   Physical Exam  Vitals reviewed. Constitutional: He is oriented to person, place, and time. He appears well-developed and well-nourished.  HENT:  Head: Normocephalic and atraumatic.  Right Ear: External ear normal.  Left Ear: External ear normal.  Mouth/Throat: Oropharynx is clear and moist.  Eyes: Conjunctivae and EOM are normal. Pupils are equal, round, and reactive to light.  Neck: Normal range of motion. Neck supple. No thyromegaly present.  Cardiovascular: Normal rate, regular rhythm, normal heart sounds and intact distal pulses.   Pulmonary/Chest: Effort normal and breath sounds normal. No respiratory distress. He has no wheezes.  Abdominal: Soft. He exhibits no distension. There is no tenderness. Hernia confirmed negative in the right inguinal area and confirmed negative in the left inguinal area.  Genitourinary: Prostate normal.  Musculoskeletal: Normal range of motion. He exhibits no edema and no tenderness.  Lymphadenopathy:    He has no cervical adenopathy.  Neurological: He is alert and oriented to person, place, and time. He has normal reflexes.  Skin: Skin is warm and dry.  Psychiatric: He has a normal mood and affect. His behavior is normal.      Assessment & Plan:   Carlee Tesfaye is a 62 y.o. male Need for prophylactic vaccination and inoculation against influenza - Plan: Flu Vaccine QUAD 36+ mos IM given.   Erectile dysfunction - Plan: sildenafil (VIAGRA) 100 MG tablet, PSA and testosterone levels pending. He did ask about urology eval, and can look into this depending on levels.   Hyperlipidemia - Plan: continue pravastatin (PRAVACHOL) 40 MG tablet, niacin (NIASPAN) 1000 MG CR tablet - refilled, Lipid panel, cmp pending.   HTN (hypertension) - controlled. Plan: lisinopril-hydrochlorothiazide (PRINZIDE,ZESTORETIC) 20-25 MG per tablet refilled.  CBC with Differential, Comprehensive metabolic panel pending. Recommended stress testing  again with RF's including age, HTN, hyperlipidemia, and FH - mom with MI at 30yo. i can refer him for this when he is ready.   Allergic rhinitis - Plan: Ambulatory referral to Allergy, fluticasone (FLONASE) 50 MCG/ACT nasal spray refilled.  Discussed meds and cough again today, but would like to try continued allergic treatment first. Can try zyrtec in addition to flonase, then consider ARB in place of ACE-I if persistent cough.   Rash and nonspecific skin eruption - Plan: Ambulatory referral to Allergy for episodic rash that appears to possibly correlate with certain foods.   May need allergy testing. rtc precautions.   Need for shingles vaccine - Plan: zoster vaccine live, PF, (ZOSTAVAX) 16109 UNT/0.65ML injection rx given for having this done at his pharmacy.   Need for prophylactic vaccination with combined diphtheria-tetanus-pertussis (DTP) vaccine - Plan: Tdap vaccine greater than or equal to 7yo IM given.   Routine general medical examination at a health care facility - Plan: Tdap vaccine greater than or equal to 7yo IM, IFOBT POC (occult bld, rslt in office), POCT urinalysis dipstick, CBC with Differential, Comprehensive metabolic panel, Lipid panel, PSA, TSH, Testosterone  -labs above, anticipatory guidance and recommendations as listed below.   -refused colonoscopy and discussed concerns, but still declines at this time. Other  recommendations as above.   Meds ordered this encounter  Medications  . sildenafil (VIAGRA) 100 MG tablet    Sig: Take 0.5-1 tablets (50-100 mg total) by mouth daily as needed for erectile dysfunction.    Dispense:  5 tablet    Refill:  11  . pravastatin (PRAVACHOL) 40 MG tablet    Sig: Take 1 tablet (  40 mg total) by mouth daily.    Dispense:  90 tablet    Refill:  1  . niacin (NIASPAN) 1000 MG CR tablet    Sig: Take 1 tablet (1,000 mg total) by mouth at bedtime.    Dispense:  90 tablet    Refill:  1  . lisinopril-hydrochlorothiazide  (PRINZIDE,ZESTORETIC) 20-25 MG per tablet    Sig: Take 1 tablet by mouth daily.    Dispense:  90 tablet    Refill:  1  . fluticasone (FLONASE) 50 MCG/ACT nasal spray    Sig: Place 2 sprays into the nose daily.    Dispense:  16 g    Refill:  6  . zoster vaccine live, PF, (ZOSTAVAX) 16109 UNT/0.65ML injection    Sig: Inject 19,400 Units into the skin once.    Dispense:  1 each    Refill:  0   Patient Instructions  Schedule eye visit. Recommend colonoscopy and stress testing as discussed. Let me know when we can refer you for this. See other info below.  We will refer you to allergist. If cough not improved with daily zyrtec or allegra, consider change in blood pressure meds.  You should receive a call or letter about your lab results within the next week to 10 days. Can discuss urology eval once those labs return.   Keeping you healthy  Get these tests  Blood pressure- Have your blood pressure checked once a year by your healthcare provider.  Normal blood pressure is 120/80  Weight- Have your body mass index (BMI) calculated to screen for obesity.  BMI is a measure of body fat based on height and weight. You can also calculate your own BMI at [email or web address].  Cholesterol- Have your cholesterol checked every year.  Diabetes- Have your blood sugar checked regularly if you have high blood pressure, high cholesterol, have a family history of diabetes or if you are overweight.  Screening for Colon Cancer- Colonoscopy starting at age 41.  Screening may begin sooner depending on your family history and other health conditions. Follow up colonoscopy as directed by your Gastroenterologist.  Screening for Prostate Cancer- Both blood work (PSA) and a rectal exam help screen for Prostate Cancer.  Screening begins at age 65 with African-American men and at age 49 with Caucasian men.  Screening may begin sooner depending on your family history.  Take these medicines  Aspirin- One  aspirin daily can help prevent Heart disease and Stroke.  Flu shot- Every fall.  Tetanus- Every 10 years.  Zostavax- Once after the age of 34 to prevent Shingles.  Pneumonia shot- Once after the age of 32; if you are younger than 25, ask your healthcare provider if you need a Pneumonia shot.  Take these steps  Don't smoke- If you do smoke, talk to your doctor about quitting.  For tips on how to quit, go to www.smokefree.gov or call 1-800-QUIT-NOW.  Be physically active- Exercise 5 days a week for at least 30 minutes.  If you are not already physically active start slow and gradually work up to 30 minutes of moderate physical activity.  Examples of moderate activity include walking briskly, mowing the yard, dancing, swimming, bicycling, etc.  Eat a healthy diet- Eat a variety of healthy food such as fruits, vegetables, low fat milk, low fat cheese, yogurt, lean meant, poultry, fish, beans, tofu, etc. For more information go to www.thenutritionsource.org  Drink alcohol in moderation- Limit alcohol intake to less than two drinks a day. Never  drink and drive.  Dentist- Brush and floss twice daily; visit your dentist twice a year.  Depression- Your emotional health is as important as your physical health. If you're feeling down, or losing interest in things you would normally enjoy please talk to your healthcare provider.  Eye exam- Visit your eye doctor every year.  Safe sex- If you may be exposed to a sexually transmitted infection, use a condom.  Seat belts- Seat belts can save your life; always wear one.  Smoke/Carbon Monoxide detectors- These detectors need to be installed on the appropriate level of your home.  Replace batteries at least once a year.  Skin cancer- When out in the sun, cover up and use sunscreen 15 SPF or higher.  Violence- If anyone is threatening you, please tell your healthcare provider.  Living Will/ Health care power of attorney- Speak with your healthcare  provider and family.

## 2012-11-12 NOTE — Patient Instructions (Signed)
Schedule eye visit. Recommend colonoscopy and stress testing as discussed. Let me know when we can refer you for this. See other info below.  We will refer you to allergist. If cough not improved with daily zyrtec or allegra, consider change in blood pressure meds.  You should receive a call or letter about your lab results within the next week to 10 days. Can discuss urology eval once those labs return.   Keeping you healthy  Get these tests  Blood pressure- Have your blood pressure checked once a year by your healthcare provider.  Normal blood pressure is 120/80  Weight- Have your body mass index (BMI) calculated to screen for obesity.  BMI is a measure of body fat based on height and weight. You can also calculate your own BMI at [email or web address].  Cholesterol- Have your cholesterol checked every year.  Diabetes- Have your blood sugar checked regularly if you have high blood pressure, high cholesterol, have a family history of diabetes or if you are overweight.  Screening for Colon Cancer- Colonoscopy starting at age 46.  Screening may begin sooner depending on your family history and other health conditions. Follow up colonoscopy as directed by your Gastroenterologist.  Screening for Prostate Cancer- Both blood work (PSA) and a rectal exam help screen for Prostate Cancer.  Screening begins at age 85 with African-American men and at age 65 with Caucasian men.  Screening may begin sooner depending on your family history.  Take these medicines  Aspirin- One aspirin daily can help prevent Heart disease and Stroke.  Flu shot- Every fall.  Tetanus- Every 10 years.  Zostavax- Once after the age of 42 to prevent Shingles.  Pneumonia shot- Once after the age of 77; if you are younger than 2, ask your healthcare provider if you need a Pneumonia shot.  Take these steps  Don't smoke- If you do smoke, talk to your doctor about quitting.  For tips on how to quit, go to  www.smokefree.gov or call 1-800-QUIT-NOW.  Be physically active- Exercise 5 days a week for at least 30 minutes.  If you are not already physically active start slow and gradually work up to 30 minutes of moderate physical activity.  Examples of moderate activity include walking briskly, mowing the yard, dancing, swimming, bicycling, etc.  Eat a healthy diet- Eat a variety of healthy food such as fruits, vegetables, low fat milk, low fat cheese, yogurt, lean meant, poultry, fish, beans, tofu, etc. For more information go to www.thenutritionsource.org  Drink alcohol in moderation- Limit alcohol intake to less than two drinks a day. Never drink and drive.  Dentist- Brush and floss twice daily; visit your dentist twice a year.  Depression- Your emotional health is as important as your physical health. If you're feeling down, or losing interest in things you would normally enjoy please talk to your healthcare provider.  Eye exam- Visit your eye doctor every year.  Safe sex- If you may be exposed to a sexually transmitted infection, use a condom.  Seat belts- Seat belts can save your life; always wear one.  Smoke/Carbon Monoxide detectors- These detectors need to be installed on the appropriate level of your home.  Replace batteries at least once a year.  Skin cancer- When out in the sun, cover up and use sunscreen 15 SPF or higher.  Violence- If anyone is threatening you, please tell your healthcare provider.  Living Will/ Health care power of attorney- Speak with your healthcare provider and family.

## 2012-11-12 NOTE — Progress Notes (Signed)
  Subjective:    Patient ID: Joshua Levine, male    DOB: 10/18/1950, 62 y.o.   MRN: [id number]  HPI    Review of Systems  HENT: Positive for congestion, postnasal drip and sneezing.   Eyes: Negative.   Respiratory: Positive for cough.   Cardiovascular: Negative.   Gastrointestinal: Positive for constipation.  Endocrine: Negative.   Genitourinary: Negative.   Musculoskeletal: Positive for back pain.  Skin: Positive for rash.  Allergic/Immunologic: Negative.   Neurological: Negative.   Hematological: Negative.   Psychiatric/Behavioral: Negative.        Objective:   Physical Exam        Assessment & Plan:

## 2012-11-13 LAB — TESTOSTERONE: Testosterone: 534 ng/dL (ref 300–890)

## 2013-01-22 ENCOUNTER — Other Ambulatory Visit: Payer: Self-pay | Admitting: Family Medicine

## 2013-04-23 ENCOUNTER — Other Ambulatory Visit: Payer: Self-pay | Admitting: Family Medicine

## 2013-05-24 ENCOUNTER — Other Ambulatory Visit: Payer: Self-pay | Admitting: Family Medicine

## 2013-06-01 ENCOUNTER — Other Ambulatory Visit: Payer: Self-pay | Admitting: Family Medicine

## 2013-06-12 ENCOUNTER — Other Ambulatory Visit: Payer: Self-pay | Admitting: Family Medicine

## 2013-06-21 ENCOUNTER — Other Ambulatory Visit: Payer: Self-pay | Admitting: Family Medicine

## 2013-07-22 ENCOUNTER — Other Ambulatory Visit: Payer: Self-pay | Admitting: Family Medicine

## 2013-10-21 ENCOUNTER — Other Ambulatory Visit: Payer: Self-pay | Admitting: *Deleted

## 2013-10-21 MED ORDER — PRAVASTATIN SODIUM 40 MG PO TABS: ORAL_TABLET | ORAL | Status: DC

## 2013-10-21 MED ORDER — LISINOPRIL-HYDROCHLOROTHIAZIDE 20-25 MG PO TABS: ORAL_TABLET | ORAL | Status: DC

## 2013-10-21 NOTE — Telephone Encounter (Signed)
Sent in #90 refills due to pt insurance. Called to inform pt needs OV. Transferred to billing to schedule.

## 2014-01-09 ENCOUNTER — Other Ambulatory Visit: Payer: Self-pay | Admitting: Emergency Medicine

## 2014-01-13 ENCOUNTER — Ambulatory Visit (INDEPENDENT_AMBULATORY_CARE_PROVIDER_SITE_OTHER): Payer: BC Managed Care – PPO | Admitting: Physician Assistant

## 2014-01-13 VITALS — BP 128/84 | HR 84 | Temp 98.5°F | Resp 16 | Ht 76.0 in | Wt 246.0 lb

## 2014-01-13 DIAGNOSIS — Z1211 Encounter for screening for malignant neoplasm of colon: Secondary | ICD-10-CM

## 2014-01-13 DIAGNOSIS — I10 Essential (primary) hypertension: Secondary | ICD-10-CM

## 2014-01-13 DIAGNOSIS — N529 Male erectile dysfunction, unspecified: Secondary | ICD-10-CM

## 2014-01-13 DIAGNOSIS — Z125 Encounter for screening for malignant neoplasm of prostate: Secondary | ICD-10-CM

## 2014-01-13 DIAGNOSIS — E785 Hyperlipidemia, unspecified: Secondary | ICD-10-CM

## 2014-01-13 DIAGNOSIS — Z23 Encounter for immunization: Secondary | ICD-10-CM

## 2014-01-13 LAB — POCT CBC
Granulocyte percent: 54.9 %G (ref 37–80)
HEMATOCRIT: 49.2 % (ref 43.5–53.7)
HEMOGLOBIN: 16.2 g/dL (ref 14.1–18.1)
Lymph, poc: 3.5 — AB (ref 0.6–3.4)
MCH: 31.9 pg — AB (ref 27–31.2)
MCHC: 33 g/dL (ref 31.8–35.4)
MCV: 96.8 fL (ref 80–97)
MID (cbc): 0.5 (ref 0–0.9)
MPV: 6.7 fL (ref 0–99.8)
POC Granulocyte: 4.8 (ref 2–6.9)
POC LYMPH PERCENT: 39.4 %L (ref 10–50)
POC MID %: 5.7 %M (ref 0–12)
Platelet Count, POC: 240 10*3/uL (ref 142–424)
RBC: 5.08 M/uL (ref 4.69–6.13)
RDW, POC: 13.1 %
WBC: 8.8 10*3/uL (ref 4.6–10.2)

## 2014-01-13 MED ORDER — SILDENAFIL CITRATE 100 MG PO TABS: 50.0000 mg | ORAL_TABLET | Freq: Every day | ORAL | Status: DC | PRN

## 2014-01-13 MED ORDER — FLUTICASONE PROPIONATE 50 MCG/ACT NA SUSP: 2.0000 | Freq: Every day | NASAL | Status: DC

## 2014-01-13 MED ORDER — NIACIN ER (ANTIHYPERLIPIDEMIC) 1000 MG PO TBCR: 1000.0000 mg | EXTENDED_RELEASE_TABLET | Freq: Every day | ORAL | Status: DC

## 2014-01-13 MED ORDER — PRAVASTATIN SODIUM 40 MG PO TABS: ORAL_TABLET | ORAL | Status: DC

## 2014-01-13 MED ORDER — LISINOPRIL-HYDROCHLOROTHIAZIDE 20-25 MG PO TABS: ORAL_TABLET | ORAL | Status: DC

## 2014-01-13 MED ORDER — ZOSTER VACCINE LIVE 19400 UNT/0.65ML ~~LOC~~ SOLR: 0.6500 mL | Freq: Once | SUBCUTANEOUS | Status: DC

## 2014-01-13 NOTE — Progress Notes (Signed)
Subjective:    Patient ID: Joshua Levine, male    DOB: 1950/07/06, 63 y.o.   MRN: [id number]   PCP: No primary care provider on file.  Chief Complaint  Patient presents with  . Medication Refill    pt does not know exactly which one he needs an refill on    No Known Allergies  Patient Active Problem List   Diagnosis Date Noted  . HTN (hypertension) 12/10/2011  . Hyperlipidemia 12/10/2011  . Erectile dysfunction 12/10/2011    Prior to Admission medications   Medication Sig Start Date End Date Taking? Authorizing Provider  Ascorbic Acid (VITAMIN C) 100 MG tablet Take 100 mg by mouth daily.   Yes Historical Provider, MD  fluticasone (FLONASE) 50 MCG/ACT nasal spray Place 2 sprays into both nostrils daily. PATIENT NEEDS OFFICE VISIT FOR ADDITIONAL REFILLS 01/10/14  Yes Elsye Mccollister Janalee Dane, PA-C  glucosamine-chondroitin 500-400 MG tablet Take 1 tablet by mouth 3 (three) times daily.   Yes Historical Provider, MD  lisinopril-hydrochlorothiazide (PRINZIDE,ZESTORETIC) 20-25 MG per tablet TAKE 1 TABLET BY MOUTH DAILY. (WILL PAY 11/7) 10/21/13  Yes Ondine Gemme S Kahlie Deutscher, PA-C  montelukast (SINGULAIR) 10 MG tablet Take 10 mg by mouth every evening. 12/23/13  Yes Historical Provider, MD  niacin (NIASPAN) 1000 MG CR tablet Take 1 tablet (1,000 mg total) by mouth at bedtime. PATIENT NEEDS AN OFFICE VISIT FOR ADDITIONAL REFILLS. 2ND NOTICE   Yes Wendie Agreste, MD  Olopatadine HCl 0.6 % SOLN  12/24/13  Yes Historical Provider, MD  pravastatin (PRAVACHOL) 40 MG tablet TAKE 1 TABLET (40 MG TOTAL) BY MOUTH DAILY. (WILL PAY 11/7) 10/21/13  Yes Kaila Devries S Elijio Staples, PA-C  sildenafil (VIAGRA) 100 MG tablet Take 0.5-1 tablets (50-100 mg total) by mouth daily as needed for erectile dysfunction. 11/12/12 09/24/13  Wendie Agreste, MD    Medical, Surgical, Family and Social History reviewed and updated.  HPI  Presents for prescription refills. He thinks he needs "everything." Last seen here in  10/2012.  Since his last visit he has been seen by allergy. Now on Singulair.  Never had a colonoscopy. iFOBT was negative last year. Did not get the shingles vaccine when prescription provided. Is not fasting, but isn't able to come back to the office for fasting labs any time soon. Last year's results reviewed with him.  Review of Systems No chest pain, SOB, HA, dizziness, vision change, N/V, diarrhea, dysuria, urinary urgency or frequency, myalgias, arthralgias or rash. Some recent constipation has resolved with increased fiber in his diet and adjusting to new job.     Objective:   Physical Exam  Constitutional: He is oriented to person, place, and time. Vital signs are normal. He appears well-developed and well-nourished. He is active and cooperative. No distress.  BP 128/84 mmHg  Pulse 84  Temp(Src) 98.5 F (36.9 C) (Oral)  Resp 16  Ht 6\' 4"  (1.93 m)  Wt 246 lb (111.585 kg)  BMI 29.96 kg/m2  SpO2 99%  HENT:  Head: Normocephalic and atraumatic.  Right Ear: Hearing normal.  Left Ear: Hearing normal.  Eyes: Conjunctivae are normal. No scleral icterus.  Neck: Normal range of motion. Neck supple. No thyromegaly present.  Cardiovascular: Normal rate, regular rhythm and normal heart sounds.   Pulses:      Radial pulses are 2+ on the right side, and 2+ on the left side.  Pulmonary/Chest: Effort normal and breath sounds normal.  Lymphadenopathy:       Head (right side): No tonsillar, no  preauricular, no posterior auricular and no occipital adenopathy present.       Head (left side): No tonsillar, no preauricular, no posterior auricular and no occipital adenopathy present.    He has no cervical adenopathy.       Right: No supraclavicular adenopathy present.       Left: No supraclavicular adenopathy present.  Neurological: He is alert and oriented to person, place, and time. No sensory deficit.  Skin: Skin is warm, dry and intact. No rash noted. No cyanosis or erythema. Nails  show no clubbing.  Psychiatric: He has a normal mood and affect.  Vitals reviewed.         Assessment & Plan:  1. Essential hypertension Controlled. Continue lisinoprilHCT. - POCT CBC - Comprehensive metabolic panel  2. Hyperlipidemia TG twice normal last year. Patient is non-fasting now. Will interpret results with that in mind. - Lipid panel  3. Erectile dysfunction, unspecified erectile dysfunction type Await lab results. Testosterone level was normal last year, but he reports "long history of low testosterone." - Testosterone - sildenafil (VIAGRA) 100 MG tablet; Take 0.5-1 tablets (50-100 mg total) by mouth daily as needed for erectile dysfunction.  Dispense: 15 tablet; Refill: prn  4. Need for prophylactic vaccination and inoculation against influenza - Flu Vaccine QUAD 36+ mos IM  5. Screening for colon cancer Not interested in colonoscopy. A friend who had one, and had polyps removed, had a massive bleed and then severe reaction to the blood transfusion. - IFOBT POC (occult bld, rslt in office)  6. Screening for prostate cancer Needs DRE. Recommend a CPE in the next year. - PSA  7. Need for shingles vaccine Encouraged to get this at his local pharmacy. - zoster vaccine live, PF, (ZOSTAVAX) 91694 UNT/0.65ML injection; Inject 19,400 Units into the skin once.  Dispense: 1 each; Refill: 0   Fara Chute, PA-C Physician Assistant-Certified [facility]

## 2014-01-13 NOTE — Patient Instructions (Signed)
I will contact you with your lab results as soon as they are available.   If you have not heard from me in 2 weeks, please contact me.  The fastest way to get your results is to register for My Chart (see the instructions on the last page of this printout).   

## 2014-01-14 LAB — LIPID PANEL
CHOLESTEROL: 183 mg/dL (ref 0–200)
HDL: 43 mg/dL (ref >39)
LDL Cholesterol: 77 mg/dL (ref 0–99)
TRIGLYCERIDES: 315 mg/dL — AB (ref <150)
Total CHOL/HDL Ratio: 4.3 Ratio
VLDL: 63 mg/dL — ABNORMAL HIGH (ref 0–40)

## 2014-01-14 LAB — COMPREHENSIVE METABOLIC PANEL
ALT: 33 U/L (ref 0–53)
AST: 30 U/L (ref 0–37)
Albumin: 4.6 g/dL (ref 3.5–5.2)
Alkaline Phosphatase: 69 U/L (ref 39–117)
BUN: 17 mg/dL (ref 6–23)
CO2: 25 meq/L (ref 19–32)
CREATININE: 1.03 mg/dL (ref 0.50–1.35)
Calcium: 10 mg/dL (ref 8.4–10.5)
Chloride: 103 mEq/L (ref 96–112)
GLUCOSE: 97 mg/dL (ref 70–99)
Potassium: 4.4 mEq/L (ref 3.5–5.3)
SODIUM: 138 meq/L (ref 135–145)
TOTAL PROTEIN: 7.2 g/dL (ref 6.0–8.3)
Total Bilirubin: 0.4 mg/dL (ref 0.2–1.2)

## 2014-01-14 LAB — TESTOSTERONE: Testosterone: 315 ng/dL (ref 300–890)

## 2014-01-15 ENCOUNTER — Encounter: Payer: Self-pay | Admitting: Physician Assistant

## 2014-01-15 LAB — PSA: PSA: 0.64 ng/mL (ref <=4.00)

## 2014-02-09 ENCOUNTER — Other Ambulatory Visit: Payer: Self-pay | Admitting: Physician Assistant

## 2014-02-17 ENCOUNTER — Other Ambulatory Visit: Payer: Self-pay | Admitting: Physician Assistant

## 2014-12-29 ENCOUNTER — Other Ambulatory Visit: Payer: Self-pay | Admitting: Physician Assistant

## 2015-02-07 ENCOUNTER — Other Ambulatory Visit: Payer: Self-pay | Admitting: Physician Assistant

## 2015-02-20 ENCOUNTER — Ambulatory Visit (INDEPENDENT_AMBULATORY_CARE_PROVIDER_SITE_OTHER): Payer: 59 | Admitting: Family Medicine

## 2015-02-20 VITALS — BP 130/86 | HR 80 | Temp 98.4°F | Resp 18 | Ht 75.5 in | Wt 239.0 lb

## 2015-02-20 DIAGNOSIS — E785 Hyperlipidemia, unspecified: Secondary | ICD-10-CM

## 2015-02-20 DIAGNOSIS — J309 Allergic rhinitis, unspecified: Secondary | ICD-10-CM

## 2015-02-20 DIAGNOSIS — L989 Disorder of the skin and subcutaneous tissue, unspecified: Secondary | ICD-10-CM

## 2015-02-20 DIAGNOSIS — I10 Essential (primary) hypertension: Secondary | ICD-10-CM

## 2015-02-20 DIAGNOSIS — N529 Male erectile dysfunction, unspecified: Secondary | ICD-10-CM | POA: Diagnosis not present

## 2015-02-20 DIAGNOSIS — R6882 Decreased libido: Secondary | ICD-10-CM | POA: Diagnosis not present

## 2015-02-20 MED ORDER — FLUTICASONE PROPIONATE 50 MCG/ACT NA SUSP: 2.0000 | Freq: Every day | NASAL | Status: DC

## 2015-02-20 MED ORDER — PRAVASTATIN SODIUM 40 MG PO TABS: 40.0000 mg | ORAL_TABLET | Freq: Every day | ORAL | Status: DC

## 2015-02-20 MED ORDER — SILDENAFIL CITRATE 100 MG PO TABS: 50.0000 mg | ORAL_TABLET | Freq: Every day | ORAL | Status: DC | PRN

## 2015-02-20 MED ORDER — LISINOPRIL-HYDROCHLOROTHIAZIDE 20-25 MG PO TABS: 1.0000 | ORAL_TABLET | Freq: Every day | ORAL | Status: DC

## 2015-02-20 NOTE — Progress Notes (Signed)
Patient ID: Joshua Levine, male    DOB: 25-Aug-1950  Age: 65 y.o. MRN: [id number]  Chief Complaint  Patient presents with  . Medication Refill    lisinopril, pravastatin    Subjective:   Patient is here for a refill of his medications. He's been doing fairly well. Review of systems fairly unremarkable. HEENT, cardiovascular, respiratory, GI, GU unremarkable. He does have a skin lesion on his left arm that he wants me to check. He is about given up on trying to get exercise regularly. We will long talk about that. He does not smoke. He continues to work regularly. He needs some more Viagra. He is concerned about his loss of libido and would like his testosterone rechecked. We talked about that a little bit but decided to go ahead and do so.  Current allergies, medications, problem list, past/family and social histories reviewed.  Objective:  BP 130/86 mmHg  Pulse 80  Temp(Src) 98.4 F (36.9 C) (Oral)  Resp 18  Ht 6' 3.5" (1.918 m)  Wt 239 lb (108.41 kg)  BMI 29.47 kg/m2  SpO2 96%  No major acute distress. Alert and oriented. TMs normal. Throat clear. Neck supple without nodes or thyromegaly. No carotid bruits. Chest is clear to auscultation. Heart regular without murmur. Abdomen soft without masses or tenderness. Extremities without edema. Irregularly shaped skin lesion on his left forearm, primarily erythematous but some shoddy areas. 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  Procedure note  Patient consented to having the punch biopsy done. It was anesthetized with 1% lidocaine with epinephrine. 3 mm punch specimen was obtained. Specimen was sent for pathologic analysis. Patient was instructed on care of the wound.  Assessment & Plan:   Assessment: 1. Essential hypertension   2. Hyperlipemia   3. Low libido   4. Skin lesion of left arm   5. Erectile dysfunction, unspecified erectile dysfunction type   6. Allergic rhinitis, unspecified allergic rhinitis type        Plan:  Orders Placed This Encounter  Procedures  . Comprehensive metabolic panel  . Testosterone, Free, Total, SHBG    Standing Status: Future     Number of Occurrences:      Standing Expiration Date: 04/22/2016  . Lipid panel    Meds ordered this encounter  Medications  . lisinopril-hydrochlorothiazide (PRINZIDE,ZESTORETIC) 20-25 MG tablet    Sig: Take 1 tablet by mouth daily.    Dispense:  15 tablet    Refill:  0    NO MORE REFILLS WITHOUT OFFICE VISIT - 2ND NOTICE  . sildenafil (VIAGRA) 100 MG tablet    Sig: Take 0.5-1 tablets (50-100 mg total) by mouth daily as needed for erectile dysfunction.    Dispense:  15 tablet    Refill:  prn  . pravastatin (PRAVACHOL) 40 MG tablet    Sig: Take 1 tablet (40 mg total) by mouth daily.    Dispense:  15 tablet    Refill:  0    NO MORE REFILLS WITHOUT OFFICE VISIT - 2ND NOTICE  . fluticasone (FLONASE) 50 MCG/ACT nasal spray    Sig: Place 2 sprays into both nostrils daily.    Dispense:  48 g    Refill:  3         Patient Instructions  Keep a Band-Aid on the skin biopsy site for a few days  Continue your current medications  We will let you know the results of your labs  Return as needed or in 6-12  months for follow-up     No Follow-up on file.   HOPPER,DAVID, MD 02/20/2015

## 2015-02-20 NOTE — Patient Instructions (Signed)
Keep a Band-Aid on the skin biopsy site for a few days  Continue your current medications  We will let you know the results of your labs  Return as needed or in 6-12 months for follow-up

## 2015-02-21 ENCOUNTER — Other Ambulatory Visit: Payer: Self-pay | Admitting: Family Medicine

## 2015-02-21 LAB — COMPREHENSIVE METABOLIC PANEL
ALT: 41 U/L (ref 9–46)
AST: 31 U/L (ref 10–35)
Albumin: 4.7 g/dL (ref 3.6–5.1)
Alkaline Phosphatase: 84 U/L (ref 40–115)
BILIRUBIN TOTAL: 0.6 mg/dL (ref 0.2–1.2)
BUN: 16 mg/dL (ref 7–25)
CO2: 26 mmol/L (ref 20–31)
CREATININE: 1.13 mg/dL (ref 0.70–1.25)
Calcium: 9.7 mg/dL (ref 8.6–10.3)
Chloride: 100 mmol/L (ref 98–110)
Glucose, Bld: 86 mg/dL (ref 65–99)
Potassium: 4.5 mmol/L (ref 3.5–5.3)
SODIUM: 137 mmol/L (ref 135–146)
TOTAL PROTEIN: 7.3 g/dL (ref 6.1–8.1)

## 2015-02-21 LAB — LIPID PANEL
CHOLESTEROL: 192 mg/dL (ref 125–200)
HDL: 32 mg/dL — ABNORMAL LOW (ref >=40)
LDL CALC: 115 mg/dL (ref <130)
Total CHOL/HDL Ratio: 6 Ratio — ABNORMAL HIGH (ref <=5.0)
Triglycerides: 225 mg/dL — ABNORMAL HIGH (ref <150)
VLDL: 45 mg/dL — AB (ref <30)

## 2015-02-22 ENCOUNTER — Encounter: Payer: Self-pay | Admitting: Family Medicine

## 2015-02-23 ENCOUNTER — Other Ambulatory Visit: Payer: Self-pay | Admitting: Physician Assistant

## 2015-03-05 ENCOUNTER — Other Ambulatory Visit: Payer: Self-pay | Admitting: Family Medicine

## 2015-08-28 ENCOUNTER — Telehealth: Payer: Self-pay

## 2015-08-28 DIAGNOSIS — L989 Disorder of the skin and subcutaneous tissue, unspecified: Secondary | ICD-10-CM

## 2015-08-28 NOTE — Telephone Encounter (Signed)
It might be a good idea to have a skin check and I will do a referral for him

## 2015-08-28 NOTE — Telephone Encounter (Signed)
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

## 2015-08-28 NOTE — Telephone Encounter (Signed)
Spoke with pt, advised results. Should he see a dermatologist? If so can we refer.

## 2016-02-16 ENCOUNTER — Other Ambulatory Visit: Payer: Self-pay | Admitting: Family Medicine

## 2016-02-16 DIAGNOSIS — J309 Allergic rhinitis, unspecified: Secondary | ICD-10-CM

## 2016-03-02 ENCOUNTER — Other Ambulatory Visit: Payer: Self-pay | Admitting: Family Medicine

## 2016-03-02 NOTE — Telephone Encounter (Signed)
Please call the patient and schedule him an appt within the next 3 months with his new PCP.

## 2016-03-06 ENCOUNTER — Other Ambulatory Visit: Payer: Self-pay | Admitting: Family Medicine

## 2016-03-22 ENCOUNTER — Ambulatory Visit (INDEPENDENT_AMBULATORY_CARE_PROVIDER_SITE_OTHER): Payer: 59 | Admitting: Physician Assistant

## 2016-03-22 VITALS — BP 112/76 | HR 73 | Temp 98.0°F | Resp 18 | Ht 75.5 in | Wt 231.0 lb

## 2016-03-22 DIAGNOSIS — E785 Hyperlipidemia, unspecified: Secondary | ICD-10-CM

## 2016-03-22 DIAGNOSIS — I10 Essential (primary) hypertension: Secondary | ICD-10-CM | POA: Diagnosis not present

## 2016-03-22 DIAGNOSIS — N529 Male erectile dysfunction, unspecified: Secondary | ICD-10-CM | POA: Diagnosis not present

## 2016-03-22 MED ORDER — SILDENAFIL CITRATE 100 MG PO TABS: 50.0000 mg | ORAL_TABLET | Freq: Every day | ORAL | 5 refills | Status: DC | PRN

## 2016-03-22 MED ORDER — PRAVASTATIN SODIUM 40 MG PO TABS: 40.0000 mg | ORAL_TABLET | Freq: Every day | ORAL | 1 refills | Status: DC

## 2016-03-22 MED ORDER — LISINOPRIL-HYDROCHLOROTHIAZIDE 20-25 MG PO TABS: 1.0000 | ORAL_TABLET | Freq: Every day | ORAL | 1 refills | Status: DC

## 2016-03-22 NOTE — Patient Instructions (Addendum)
Follow up in 6 months with me for refills. In the meantime think about the colonoscopy!  Thank you for letting me participate in your health and well being.      IF you received an x-ray today, you will receive an invoice from [facility]. Please contact [facility] at [phone] with questions or concerns regarding your invoice.   IF you received labwork today, you will receive an invoice from [employer]. Please contact [facility] at [phone] with questions or concerns regarding your invoice.   Our billing staff will not be able to assist you with questions regarding bills from these companies.  You will be contacted with the lab results as soon as they are available. The fastest way to get your results is to activate your My Chart account. Instructions are located on the last page of this paperwork. If you have not heard from [place] regarding the results in 2 weeks, please contact this office.

## 2016-03-22 NOTE — Progress Notes (Signed)
    MRN: [id number] DOB: [birth year]  Subjective:   Joshua Levine is a 66 y.o. male presenting for medication refill for lisinopril-hctz, pravastatin, and viagra. He has not followed up for these conditions in one year.   HTN: Has had dx for decades. Currently managed with lisinopril-hctz 20-'25mg'$ . Patient is not checking blood pressure at home. Reports no symtpoms. Denies lightheadedness, dizziness, chronic headache, double vision, chest pain, shortness of breath, heart racing, palpitations, nausea, vomiting, abdominal pain, hematuria, lower leg swelling. Denies smoking or alcohol use.   HLD: Controlled on pravastatin '40mg'$ . Notes his cholesterol is typically well controlled, it is his triglycerides that are normally elevated.   Erectile dysfunction: Typically takes '50mg'$  once a week or every other week.   Has no other questions or concerns.   Joshua Levine has a current medication list which includes the following prescription(s): vitamin c, fluticasone, glucosamine-chondroitin, lisinopril-hydrochlorothiazide, olopatadine hcl, pravastatin, sildenafil, niacin, and zoster vaccine live (pf). Also has No Known Allergies.  Joshua Levine  has a past medical history of Allergy; Arthritis; Erectile dysfunction; Hyperlipidemia; and Hypertension. Also  has a past surgical history that includes Spine surgery and Lumbar disc surgery.   Objective:   Vitals: BP 112/76   Pulse 73   Temp 98 F (36.7 C) (Oral)   Resp 18   Ht 6' 3.5" (1.918 m)   Wt 231 lb (104.8 kg)   SpO2 97%   BMI 28.49 kg/m   Physical Exam  Constitutional: He is oriented to person, place, and time. He appears well-developed and well-nourished.  HENT:  Head: Normocephalic and atraumatic.  Eyes: Conjunctivae are normal. Pupils are equal, round, and reactive to light.  Neck: Normal range of motion.  Cardiovascular: Normal rate, regular rhythm, normal heart sounds and intact distal pulses.   Pulmonary/Chest: Effort normal and breath sounds  normal.  Musculoskeletal:       Right lower leg: He exhibits no swelling.       Left lower leg: He exhibits no swelling.  Neurological: He is alert and oriented to person, place, and time.  Skin: Skin is warm and dry.  Psychiatric: He has a normal mood and affect.  Vitals reviewed.   No results found for this or any previous visit (from the past 24 hour(s)).  Assessment and Plan :  1. Essential hypertension Controlled, follow up in 6 months for reevaluation.  -Labs pending - CMP14+EGFR - lisinopril-hydrochlorothiazide (PRINZIDE,ZESTORETIC) 20-25 MG tablet; Take 1 tablet by mouth daily.  Dispense: 90 tablet; Refill: 1  2. Hyperlipidemia, unspecified hyperlipidemia type Labs pending  - Lipid panel - pravastatin (PRAVACHOL) 40 MG tablet; Take 1 tablet (40 mg total) by mouth daily.  Dispense: 90 tablet; Refill: 1  3. Erectile dysfunction, unspecified erectile dysfunction type viagra Rx given - use lowest effective dose. Side effects discussed (including but not limited to headache/flushing, blue discoloration of vision, possible vascular steal and risk of cardiac effects if underlying unknown coronary artery disease, and permanent sensorineural hearing loss). Understanding expressed. - sildenafil (VIAGRA) 100 MG tablet; Take 0.5-1 tablets (50-100 mg total) by mouth daily as needed for erectile dysfunction.  Dispense: 5 tablet; Refill: Dodge, PA-C  [facility] 03/22/2016 5:51 PM

## 2016-03-23 ENCOUNTER — Encounter: Payer: Self-pay | Admitting: *Deleted

## 2016-03-23 LAB — CMP14+EGFR
ALK PHOS: 99 IU/L (ref 39–117)
ALT: 25 IU/L (ref 0–44)
AST: 22 IU/L (ref 0–40)
Albumin/Globulin Ratio: 1.6 (ref 1.2–2.2)
Albumin: 4.6 g/dL (ref 3.6–4.8)
BILIRUBIN TOTAL: 0.5 mg/dL (ref 0.0–1.2)
BUN/Creatinine Ratio: 19 (ref 10–24)
BUN: 19 mg/dL (ref 8–27)
CHLORIDE: 99 mmol/L (ref 96–106)
CO2: 22 mmol/L (ref 18–29)
Calcium: 10 mg/dL (ref 8.6–10.2)
Creatinine, Ser: 1.02 mg/dL (ref 0.76–1.27)
GFR calc Af Amer: 89 (ref >59)
GFR calc non Af Amer: 77 (ref >59)
GLUCOSE: 89 mg/dL (ref 65–99)
Globulin, Total: 2.8 (ref 1.5–4.5)
Potassium: 4.6 mmol/L (ref 3.5–5.2)
Sodium: 139 mmol/L (ref 134–144)
TOTAL PROTEIN: 7.4 g/dL (ref 6.0–8.5)

## 2016-03-23 LAB — LIPID PANEL
CHOLESTEROL TOTAL: 177 mg/dL (ref 100–199)
Chol/HDL Ratio: 5.2 — ABNORMAL HIGH (ref 0.0–5.0)
HDL: 34 mg/dL — AB (ref >39)
LDL Calculated: 93 (ref 0–99)
Triglycerides: 251 mg/dL — ABNORMAL HIGH (ref 0–149)
VLDL Cholesterol Cal: 50 — ABNORMAL HIGH (ref 5–40)

## 2016-08-15 ENCOUNTER — Encounter: Payer: Self-pay | Admitting: Family Medicine

## 2016-08-15 ENCOUNTER — Ambulatory Visit (INDEPENDENT_AMBULATORY_CARE_PROVIDER_SITE_OTHER): Payer: 59 | Admitting: Family Medicine

## 2016-08-15 VITALS — BP 134/80 | HR 88 | Temp 98.9°F | Resp 16 | Ht 75.5 in | Wt 237.0 lb

## 2016-08-15 DIAGNOSIS — R05 Cough: Secondary | ICD-10-CM

## 2016-08-15 DIAGNOSIS — J069 Acute upper respiratory infection, unspecified: Secondary | ICD-10-CM | POA: Diagnosis not present

## 2016-08-15 DIAGNOSIS — R059 Cough, unspecified: Secondary | ICD-10-CM

## 2016-08-15 MED ORDER — HYDROCODONE-HOMATROPINE 5-1.5 MG/5ML PO SYRP: ORAL_SOLUTION | ORAL | 0 refills | Status: DC

## 2016-08-15 MED ORDER — BENZONATATE 100 MG PO CAPS: 100.0000 mg | ORAL_CAPSULE | Freq: Three times a day (TID) | ORAL | 0 refills | Status: DC | PRN

## 2016-08-15 NOTE — Progress Notes (Signed)
Subjective:  By signing my name below, I, Joshua Levine, attest that this documentation has been prepared under the direction and in the presence of Joshua Ray, MD. Electronically Signed: [name], [place]. 08/15/2016 , 10:52 AM .  Patient was seen in Room 11 .   Patient ID: Joshua Levine, male    DOB: 09-22-1950, 66 y.o.   MRN: [id number] Chief Complaint  Patient presents with  . URI    HA, fever in evening, yellow mucus, chest congestion x 2 days, taste Levine sometimes after coughing    HPI Joshua Levine is a 66 y.o. male  Patient is a former smoker, quit a year after he started when he was a teenager. He has history of HTN and hyperlipidemia.   Patient states his symptoms started with a scratchy throat 3 days ago. He felt his symptoms becoming progressively worse and less comfortable with sinus headache, congestion, and nasal drainage. He reports his nasal discharge is yellow in color throughout the day with some tinges of Levine. He also informs associated uncontrollable coughing fits that lasts about 5 seconds with faint taste of Levine after the coughing fit. He denies coughing up any mucus. Yesterday, he measured fever (Tmax 100.4). He's been taking mucinex. He mentions trouble sleeping with the cough 2 nights ago, but improved last night. He denies history with hydrocodone problems or addictions on them.   He has sick contact from co-worker in the cubicle next to him. The co-worker had long term sinusitis which led into bronchitis and was treated with antibiotics. He was out of work for 3 days, and returned continuing coughing.   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   Patient Active Problem List   Diagnosis Date Noted  . HTN (hypertension) 12/10/2011  . Hyperlipidemia 12/10/2011  . Erectile dysfunction 12/10/2011   Past Medical History:  Diagnosis Date  . Allergy   . Arthritis   . Basal cell carcinoma   . Erectile dysfunction   .  Hyperlipidemia   . Hypertension    Past Surgical History:  Procedure Laterality Date  . LUMBAR DISC SURGERY    . SPINE SURGERY     No Known Allergies Prior to Admission medications   Medication Sig Start Date End Date Taking? Authorizing Provider  Ascorbic Acid (VITAMIN C) 100 MG tablet Take 100 mg by mouth daily.    [provider]  fluticasone (FLONASE) 50 MCG/ACT nasal spray Place 2 sprays into both nostrils daily. 02/20/15   Posey Boyer, MD  glucosamine-chondroitin 500-400 MG tablet Take 1 tablet by mouth 3 (three) times daily.    [provider]  lisinopril-hydrochlorothiazide (PRINZIDE,ZESTORETIC) 20-25 MG tablet Take 1 tablet by mouth daily. 03/22/16   Tenna Delaine D, PA-C  niacin (NIASPAN) 1000 MG CR tablet TAKE 1 TABLET (1,000 MG TOTAL) BY MOUTH AT BEDTIME  "NO MORE REFILLS WITHOUT OFFICE VISIT" 12/30/14   Harrison Mons, PA-C  Olopatadine HCl 0.6 % SOLN  12/24/13   [provider]  pravastatin (PRAVACHOL) 40 MG tablet Take 1 tablet (40 mg total) by mouth daily. 03/22/16   Tenna Delaine D, PA-C  sildenafil (VIAGRA) 100 MG tablet Take 0.5-1 tablets (50-100 mg total) by mouth daily as needed for erectile dysfunction. 03/22/16 04/29/17  Tenna Delaine D, PA-C  zoster vaccine live, PF, (ZOSTAVAX) 36144 UNT/0.65ML injection Inject 19,400 Units into the skin once. Patient not taking: Reported on 02/20/2015 01/13/14   Harrison Mons, PA-C   Social History  Social History  . Marital status: Married    Spouse name: N/A  . Number of children: N/A  . Years of education: N/A   Occupational History  . [employer]   Social History Main Topics  . Smoking status: Former Research scientist (life sciences)  . Smokeless tobacco: Never Used  . Alcohol use No  . Drug use: No  . Sexual activity: Yes   Other Topics Concern  . Not on file   Social History Narrative   Married. Education: [employer]. Exercise: Walk 2 times a week for 45 minutes.   Review of Systems    Constitutional: Positive for fever. Negative for activity change, chills, fatigue and unexpected weight change.  HENT: Positive for congestion and sinus pressure. Negative for sinus pain.   Eyes: Negative for visual disturbance.  Respiratory: Positive for cough. Negative for chest tightness, shortness of breath and wheezing.   Cardiovascular: Negative for chest pain, palpitations and leg swelling.  Gastrointestinal: Negative for abdominal pain and Levine in stool.  Neurological: Positive for headaches. Negative for dizziness and light-headedness.       Objective:   Physical Exam  Constitutional: He is oriented to person, place, and time. He appears well-developed and well-nourished.  HENT:  Head: Normocephalic and atraumatic.  Right Ear: Tympanic membrane, external ear and ear canal normal.  Left Ear: Tympanic membrane, external ear and ear canal normal.  Nose: Mucosal edema (minimal) present. No rhinorrhea or nasal septal hematoma. Right sinus exhibits no maxillary sinus tenderness and no frontal sinus tenderness. Left sinus exhibits no maxillary sinus tenderness and no frontal sinus tenderness.  Mouth/Throat: Oropharynx is clear and moist and mucous membranes are normal. No oropharyngeal exudate or posterior oropharyngeal erythema.  Right ear: minimal cerumen in canal Nasal: no active discharge, no visible bleeding of the septum  Eyes: Pupils are equal, round, and reactive to light. Conjunctivae are normal.  Neck: Neck supple.  Cardiovascular: Normal rate, regular rhythm, normal heart sounds and intact distal pulses.   No murmur heard. Pulmonary/Chest: Effort normal and breath sounds normal. He has no wheezes. He has no rhonchi. He has no rales.  Abdominal: Soft. There is no tenderness.  Lymphadenopathy:    He has no cervical adenopathy.  Neurological: He is alert and oriented to person, place, and time.  Skin: Skin is warm and dry. No Levine noted.  Psychiatric: He has a normal mood  and affect. His behavior is normal.  Vitals reviewed.   Vitals:   08/15/16 1035  BP: 134/80  Pulse: 88  Resp: 16  Temp: 98.9 F (37.2 C)  TempSrc: Oral  SpO2: 96%  Weight: 237 lb (107.5 kg)  Height: 6' 3.5" (1.918 m)      Assessment & Plan:    Voyd Groft is a 66 y.o. male Cough - Plan: benzonatate (TESSALON) 100 MG capsule, HYDROcodone-homatropine (HYCODAN) 5-1.5 MG/5ML syrup  Acute upper respiratory infection  Reassuring exam, suspected viral upper respiratory infection with sinus congestion and cough.Tinges of Levine with sinus congestion may be due to irritation of the nasal passages, as previously had been using Flonase every day. Correct technique for using nasal sprays was discussed.   - Saline nasal spray, fluids, rest and other symptomatic care for sinuses were discussed with plan of calling in 1 week if sinuses are not improving and I can call in Augmentin at that time. -for cough, suspect that he has postnasal drainage from respiratory infection. Lungs clear, afebrile in office.  If fevers, shortness of breath, or other worsening,  return for recheck, possible imaging.  Follow-up within 1 month for physical and can address hypertension and hyperlipidemia with refills at that time. He is not fasting today.  Meds ordered this encounter  Medications  . benzonatate (TESSALON) 100 MG capsule    Sig: Take 1 capsule (100 mg total) by mouth 3 (three) times daily as needed for cough.    Dispense:  20 capsule    Refill:  0  . HYDROcodone-homatropine (HYCODAN) 5-1.5 MG/5ML syrup    Sig: 69m by mouth a bedtime as needed for cough.    Dispense:  120 mL    Refill:  0   Patient Instructions   Currently your symptoms appear to be due to a virus. Continue Mucinex, or can try the Tessalon Perles if needed for cough, saline/salt water nasal spray throughout the day for sinus congestion, drink plenty fluids and rest. Hydrocodone cough syrup at night if needed.  If discolored nasal  discharge, headache, sinus symptoms are not improving in 1 week, call and I do not mind sending an antibiotic into your pharmacy. However if any worsening symptoms including worsening cough, shortness of breath, or fevers, return for recheck as you may need other testing or x-rays.   Return to the clinic or go to the nearest emergency room if any of your symptoms worsen or new symptoms occur.   Please schedule appointment for physical within the next 1 month to discuss cholesterol, Levine pressure and other health maintenance items.  Cough, Adult Coughing is a reflex that clears your throat and your airways. Coughing helps to heal and protect your lungs. It is normal to cough occasionally, but a cough that happens with other symptoms or lasts a long time may be a sign of a condition that needs treatment. A cough may last only 2-3 weeks (acute), or it may last longer than 8 weeks (chronic). What are the causes? Coughing is commonly caused by:  Breathing in substances that irritate your lungs.  A viral or bacterial respiratory infection.  Allergies.  Asthma.  Postnasal drip.  Smoking.  Acid backing up from the stomach into the esophagus (gastroesophageal reflux).  Certain medicines.  Chronic lung problems, including COPD (or rarely, lung cancer).  Other medical conditions such as heart failure.  Follow these instructions at home: Pay attention to any changes in your symptoms. Take these actions to help with your discomfort:  Take medicines only as told by your health care provider. ? If you were prescribed an antibiotic medicine, take it as told by your health care provider. Do not stop taking the antibiotic even if you start to feel better. ? Talk with your health care provider before you take a cough suppressant medicine.  Drink enough fluid to keep your urine clear or pale yellow.  If the air is dry, use a cold steam vaporizer or humidifier in your bedroom or your home to  help loosen secretions.  Avoid anything that causes you to cough at work or at home.  If your cough is worse at night, try sleeping in a semi-upright position.  Avoid cigarette smoke. If you smoke, quit smoking. If you need help quitting, ask your health care provider.  Avoid caffeine.  Avoid alcohol.  Rest as needed.  Contact a health care provider if:  You have new symptoms.  You cough up pus.  Your cough does not get better after 2-3 weeks, or your cough gets worse.  You cannot control your cough with suppressant medicines and you  are losing sleep.  You develop pain that is getting worse or pain that is not controlled with pain medicines.  You have a fever.  You have unexplained weight loss.  You have night sweats. Get help right away if:  You cough up Levine.  You have difficulty breathing.  Your heartbeat is very fast. This information is not intended to replace advice given to you by your health care provider. Make sure you discuss any questions you have with your health care provider. Document Released: 07/16/2010 Document Revised: 06/25/2015 Document Reviewed: 03/26/2014 Elsevier Interactive Patient Education  2017 [place].  Upper Respiratory Infection, Adult Most upper respiratory infections (URIs) are a viral infection of the air passages leading to the lungs. A URI affects the nose, throat, and upper air passages. The most common type of URI is nasopharyngitis and is typically referred to as "the common cold." URIs run their course and usually go away on their own. Most of the time, a URI does not require medical attention, but sometimes a bacterial infection in the upper airways can follow a viral infection. This is called a secondary infection. Sinus and middle ear infections are common types of secondary upper respiratory infections. Bacterial pneumonia can also complicate a URI. A URI can worsen asthma and chronic obstructive pulmonary disease (COPD).  Sometimes, these complications can require emergency medical care and may be life threatening. What are the causes? Almost all URIs are caused by viruses. A virus is a type of germ and can spread from one person to another. What increases the risk? You may be at risk for a URI if:  You smoke.  You have chronic heart or lung disease.  You have a weakened defense (immune) system.  You are very young or very old.  You have nasal allergies or asthma.  You work in crowded or poorly ventilated areas.  You work in health care facilities or schools.  What are the signs or symptoms? Symptoms typically develop 2-3 days after you come in contact with a cold virus. Most viral URIs last 7-10 days. However, viral URIs from the influenza virus (flu virus) can last 14-18 days and are typically more severe. Symptoms may include:  Runny or stuffy (congested) nose.  Sneezing.  Cough.  Sore throat.  Headache.  Fatigue.  Fever.  Loss of appetite.  Pain in your forehead, behind your eyes, and over your cheekbones (sinus pain).  Muscle aches.  How is this diagnosed? Your health care provider may diagnose a URI by:  Physical exam.  Tests to check that your symptoms are not due to another condition such as: ? Strep throat. ? Sinusitis. ? Pneumonia. ? Asthma.  How is this treated? A URI goes away on its own with time. It cannot be cured with medicines, but medicines may be prescribed or recommended to relieve symptoms. Medicines may help:  Reduce your fever.  Reduce your cough.  Relieve nasal congestion.  Follow these instructions at home:  Take medicines only as directed by your health care provider.  Gargle warm saltwater or take cough drops to comfort your throat as directed by your health care provider.  Use a warm mist humidifier or inhale steam from a shower to increase air moisture. This may make it easier to breathe.  Drink enough fluid to keep your urine clear or  pale yellow.  Eat soups and other clear broths and maintain good nutrition.  Rest as needed.  Return to work when your temperature has returned  to normal or as your health care provider advises. You may need to stay home longer to avoid infecting others. You can also use a face mask and careful hand washing to prevent spread of the virus.  Increase the usage of your inhaler if you have asthma.  Do not use any tobacco products, including cigarettes, chewing tobacco, or electronic cigarettes. If you need help quitting, ask your health care provider. How is this prevented? The best way to protect yourself from getting a cold is to practice good hygiene.  Avoid oral or hand contact with people with cold symptoms.  Wash your hands often if contact occurs.  There is no clear evidence that vitamin C, vitamin E, echinacea, or exercise reduces the chance of developing a cold. However, it is always recommended to get plenty of rest, exercise, and practice good nutrition. Contact a health care provider if:  You are getting worse rather than better.  Your symptoms are not controlled by medicine.  You have chills.  You have worsening shortness of breath.  You have brown or red mucus.  You have yellow or brown nasal discharge.  You have pain in your face, especially when you bend forward.  You have a fever.  You have swollen neck glands.  You have pain while swallowing.  You have white areas in the back of your throat. Get help right away if:  You have severe or persistent: ? Headache. ? Ear pain. ? Sinus pain. ? Chest pain.  You have chronic lung disease and any of the following: ? Wheezing. ? Prolonged cough. ? Coughing up Levine. ? A change in your usual mucus.  You have a stiff neck.  You have changes in your: ? Vision. ? Hearing. ? Thinking. ? Mood. This information is not intended to replace advice given to you by your health care provider. Make sure you discuss  any questions you have with your health care provider. Document Released: 07/13/2000 Document Revised: 09/20/2015 Document Reviewed: 04/24/2013 Elsevier Interactive Patient Education  2017 [employer].   IF you received an x-Levine today, you will receive an invoice from [facility]. Please contact [facility] at [phone] with questions or concerns regarding your invoice.   IF you received labwork today, you will receive an invoice from [facility]. Please contact [facility] at [phone] with questions or concerns regarding your invoice.   Our billing staff will not be able to assist you with questions regarding bills from these companies.  You will be contacted with the lab results as soon as they are available. The fastest way to get your results is to activate your My Chart account. Instructions are located on the last page of this paperwork. If you have not heard from [place] regarding the results in 2 weeks, please contact this office.       I personally performed the services described in this documentation, which was scribed in my presence. The recorded information has been reviewed and considered for accuracy and completeness, addended by me as needed, and agree with information above.  Signed,   Joshua Ray, MD [facility].  08/15/16 11:12 AM

## 2016-08-15 NOTE — Patient Instructions (Addendum)
Currently your symptoms appear to be due to a virus. Continue Mucinex, or can try the Tessalon Perles if needed for cough, saline/salt water nasal spray throughout the day for sinus congestion, drink plenty fluids and rest. Hydrocodone cough syrup at night if needed.  If discolored nasal discharge, headache, sinus symptoms are not improving in 1 week, call and I do not mind sending an antibiotic into your pharmacy. However if any worsening symptoms including worsening cough, shortness of breath, or fevers, return for recheck as you may need other testing or x-rays.   Return to the clinic or go to the nearest emergency room if any of your symptoms worsen or new symptoms occur.   Please schedule appointment for physical within the next 1 month to discuss cholesterol, blood pressure and other health maintenance items.  Cough, Adult Coughing is a reflex that clears your throat and your airways. Coughing helps to heal and protect your lungs. It is normal to cough occasionally, but a cough that happens with other symptoms or lasts a long time may be a sign of a condition that needs treatment. A cough may last only 2-3 weeks (acute), or it may last longer than 8 weeks (chronic). What are the causes? Coughing is commonly caused by:  Breathing in substances that irritate your lungs.  A viral or bacterial respiratory infection.  Allergies.  Asthma.  Postnasal drip.  Smoking.  Acid backing up from the stomach into the esophagus (gastroesophageal reflux).  Certain medicines.  Chronic lung problems, including COPD (or rarely, lung cancer).  Other medical conditions such as heart failure.  Follow these instructions at home: Pay attention to any changes in your symptoms. Take these actions to help with your discomfort:  Take medicines only as told by your health care provider. ? If you were prescribed an antibiotic medicine, take it as told by your health care provider. Do not stop taking the  antibiotic even if you start to feel better. ? Talk with your health care provider before you take a cough suppressant medicine.  Drink enough fluid to keep your urine clear or pale yellow.  If the air is dry, use a cold steam vaporizer or humidifier in your bedroom or your home to help loosen secretions.  Avoid anything that causes you to cough at work or at home.  If your cough is worse at night, try sleeping in a semi-upright position.  Avoid cigarette smoke. If you smoke, quit smoking. If you need help quitting, ask your health care provider.  Avoid caffeine.  Avoid alcohol.  Rest as needed.  Contact a health care provider if:  You have new symptoms.  You cough up pus.  Your cough does not get better after 2-3 weeks, or your cough gets worse.  You cannot control your cough with suppressant medicines and you are losing sleep.  You develop pain that is getting worse or pain that is not controlled with pain medicines.  You have a fever.  You have unexplained weight loss.  You have night sweats. Get help right away if:  You cough up blood.  You have difficulty breathing.  Your heartbeat is very fast. This information is not intended to replace advice given to you by your health care provider. Make sure you discuss any questions you have with your health care provider. Document Released: 07/16/2010 Document Revised: 06/25/2015 Document Reviewed: 03/26/2014 Elsevier Interactive Patient Education  2017 [place].  Upper Respiratory Infection, Adult Most upper respiratory infections (URIs) are a  viral infection of the air passages leading to the lungs. A URI affects the nose, throat, and upper air passages. The most common type of URI is nasopharyngitis and is typically referred to as "the common cold." URIs run their course and usually go away on their own. Most of the time, a URI does not require medical attention, but sometimes a bacterial infection in the upper  airways can follow a viral infection. This is called a secondary infection. Sinus and middle ear infections are common types of secondary upper respiratory infections. Bacterial pneumonia can also complicate a URI. A URI can worsen asthma and chronic obstructive pulmonary disease (COPD). Sometimes, these complications can require emergency medical care and may be life threatening. What are the causes? Almost all URIs are caused by viruses. A virus is a type of germ and can spread from one person to another. What increases the risk? You may be at risk for a URI if:  You smoke.  You have chronic heart or lung disease.  You have a weakened defense (immune) system.  You are very young or very old.  You have nasal allergies or asthma.  You work in crowded or poorly ventilated areas.  You work in health care facilities or schools.  What are the signs or symptoms? Symptoms typically develop 2-3 days after you come in contact with a cold virus. Most viral URIs last 7-10 days. However, viral URIs from the influenza virus (flu virus) can last 14-18 days and are typically more severe. Symptoms may include:  Runny or stuffy (congested) nose.  Sneezing.  Cough.  Sore throat.  Headache.  Fatigue.  Fever.  Loss of appetite.  Pain in your forehead, behind your eyes, and over your cheekbones (sinus pain).  Muscle aches.  How is this diagnosed? Your health care provider may diagnose a URI by:  Physical exam.  Tests to check that your symptoms are not due to another condition such as: ? Strep throat. ? Sinusitis. ? Pneumonia. ? Asthma.  How is this treated? A URI goes away on its own with time. It cannot be cured with medicines, but medicines may be prescribed or recommended to relieve symptoms. Medicines may help:  Reduce your fever.  Reduce your cough.  Relieve nasal congestion.  Follow these instructions at home:  Take medicines only as directed by your health care  provider.  Gargle warm saltwater or take cough drops to comfort your throat as directed by your health care provider.  Use a warm mist humidifier or inhale steam from a shower to increase air moisture. This may make it easier to breathe.  Drink enough fluid to keep your urine clear or pale yellow.  Eat soups and other clear broths and maintain good nutrition.  Rest as needed.  Return to work when your temperature has returned to normal or as your health care provider advises. You may need to stay home longer to avoid infecting others. You can also use a face mask and careful hand washing to prevent spread of the virus.  Increase the usage of your inhaler if you have asthma.  Do not use any tobacco products, including cigarettes, chewing tobacco, or electronic cigarettes. If you need help quitting, ask your health care provider. How is this prevented? The best way to protect yourself from getting a cold is to practice good hygiene.  Avoid oral or hand contact with people with cold symptoms.  Wash your hands often if contact occurs.  There is no clear  evidence that vitamin C, vitamin E, echinacea, or exercise reduces the chance of developing a cold. However, it is always recommended to get plenty of rest, exercise, and practice good nutrition. Contact a health care provider if:  You are getting worse rather than better.  Your symptoms are not controlled by medicine.  You have chills.  You have worsening shortness of breath.  You have brown or red mucus.  You have yellow or brown nasal discharge.  You have pain in your face, especially when you bend forward.  You have a fever.  You have swollen neck glands.  You have pain while swallowing.  You have white areas in the back of your throat. Get help right away if:  You have severe or persistent: ? Headache. ? Ear pain. ? Sinus pain. ? Chest pain.  You have chronic lung disease and any of the  following: ? Wheezing. ? Prolonged cough. ? Coughing up blood. ? A change in your usual mucus.  You have a stiff neck.  You have changes in your: ? Vision. ? Hearing. ? Thinking. ? Mood. This information is not intended to replace advice given to you by your health care provider. Make sure you discuss any questions you have with your health care provider. Document Released: 07/13/2000 Document Revised: 09/20/2015 Document Reviewed: 04/24/2013 Elsevier Interactive Patient Education  2017 [employer].   IF you received an x-ray today, you will receive an invoice from [facility] Radiology. Please contact [facility] Radiology at [phone] with questions or concerns regarding your invoice.   IF you received labwork today, you will receive an invoice from [place]. Please contact [facility] at [phone] with questions or concerns regarding your invoice.   Our billing staff will not be able to assist you with questions regarding bills from these companies.  You will be contacted with the lab results as soon as they are available. The fastest way to get your results is to activate your My Chart account. Instructions are located on the last page of this paperwork. If you have not heard from [place] regarding the results in 2 weeks, please contact this office.

## 2016-09-15 ENCOUNTER — Encounter: Payer: Self-pay | Admitting: Family Medicine

## 2016-09-15 ENCOUNTER — Ambulatory Visit (INDEPENDENT_AMBULATORY_CARE_PROVIDER_SITE_OTHER): Payer: 59 | Admitting: Family Medicine

## 2016-09-15 VITALS — BP 118/76 | HR 67 | Temp 98.1°F | Resp 16 | Ht 74.0 in | Wt 234.0 lb

## 2016-09-15 DIAGNOSIS — Z23 Encounter for immunization: Secondary | ICD-10-CM | POA: Diagnosis not present

## 2016-09-15 DIAGNOSIS — R05 Cough: Secondary | ICD-10-CM

## 2016-09-15 DIAGNOSIS — E785 Hyperlipidemia, unspecified: Secondary | ICD-10-CM

## 2016-09-15 DIAGNOSIS — Z1211 Encounter for screening for malignant neoplasm of colon: Secondary | ICD-10-CM | POA: Diagnosis not present

## 2016-09-15 DIAGNOSIS — Z0000 Encounter for general adult medical examination without abnormal findings: Secondary | ICD-10-CM | POA: Diagnosis not present

## 2016-09-15 DIAGNOSIS — R059 Cough, unspecified: Secondary | ICD-10-CM

## 2016-09-15 DIAGNOSIS — Z114 Encounter for screening for human immunodeficiency virus [HIV]: Secondary | ICD-10-CM

## 2016-09-15 DIAGNOSIS — I10 Essential (primary) hypertension: Secondary | ICD-10-CM

## 2016-09-15 DIAGNOSIS — Z125 Encounter for screening for malignant neoplasm of prostate: Secondary | ICD-10-CM

## 2016-09-15 DIAGNOSIS — Z1159 Encounter for screening for other viral diseases: Secondary | ICD-10-CM | POA: Diagnosis not present

## 2016-09-15 DIAGNOSIS — Z8042 Family history of malignant neoplasm of prostate: Secondary | ICD-10-CM

## 2016-09-15 DIAGNOSIS — N529 Male erectile dysfunction, unspecified: Secondary | ICD-10-CM

## 2016-09-15 LAB — POCT URINALYSIS DIP (MANUAL ENTRY)
Bilirubin, UA: NEGATIVE
Glucose, UA: NEGATIVE mg/dL
Ketones, POC UA: NEGATIVE mg/dL
Leukocytes, UA: NEGATIVE
Nitrite, UA: NEGATIVE
PROTEIN UA: NEGATIVE mg/dL
RBC UA: NEGATIVE
SPEC GRAV UA: 1.02 (ref 1.010–1.025)
UROBILINOGEN UA: 0.2 U/dL
pH, UA: 5 (ref 5.0–8.0)

## 2016-09-15 MED ORDER — LISINOPRIL-HYDROCHLOROTHIAZIDE 20-25 MG PO TABS: 1.0000 | ORAL_TABLET | Freq: Every day | ORAL | 1 refills | Status: DC

## 2016-09-15 MED ORDER — PRAVASTATIN SODIUM 40 MG PO TABS: 40.0000 mg | ORAL_TABLET | Freq: Every day | ORAL | 1 refills | Status: DC

## 2016-09-15 MED ORDER — ZOSTER VAC RECOMB ADJUVANTED 50 MCG/0.5ML IM SUSR: 0.5000 mL | Freq: Once | INTRAMUSCULAR | 1 refills | Status: AC

## 2016-09-15 NOTE — Progress Notes (Signed)
Subjective:  By signing my name below, I, Joshua Levine, attest that this documentation has been prepared under the direction and in the presence of Joshua Ray, MD. Electronically Signed: [name], [place]. 09/15/2016 , 8:43 AM .  Patient was seen in Room 25 .   Patient ID: Joshua Levine, male    DOB: January 25, 1951, 66 y.o.   MRN: [id number] Chief Complaint  Patient presents with  . Annual Exam   HPI Joshua Levine is a 66 y.o. male Here for annual physical. Patient has a history of HTN, hyperlipidemia, and erectile dysfunction. He is fasting today.   Cough Patient was seen for cough on July 16th, thought to be viral syndrome at that time. He was treated with Hycodan cough syrup. He states he's feeling much better as his cough has slowly and gradually improved, but not completely gone. He notes still coughing up some phlegm; mentioned slight taste of Levine a few weeks ago, but not recently. He never took antibiotics. He denies fever, chills, or postnasal drip. He's not taking Mucinex to help the phlegm at the moment; last taken a few weeks ago.   HTN He takes Lisinopril-HCTZ 20-25mg  QD. No new cough or side effects.  Lab Results  Component Value Date   CREATININE 1.02 03/22/2016    Hyperlipidemia Lab Results  Component Value Date   CHOL 177 03/22/2016   HDL 34 (L) 03/22/2016   LDLCALC 93 03/22/2016   TRIG 251 (H) 03/22/2016   CHOLHDL 5.2 (H) 03/22/2016   Lab Results  Component Value Date   ALT 25 03/22/2016   AST 22 03/22/2016   ALKPHOS 99 03/22/2016   BILITOT 0.5 03/22/2016   He takes Niaspan (OTC, 400-500mg ) as well as pravastatin. He has mixed hyperlipidemia with persistent elevated triglycerides. He was recommended diet changes. He denies any muscle aches.   Wt Readings from Last 3 Encounters:  09/15/16 234 lb (106.1 kg)  08/15/16 237 lb (107.5 kg)  03/22/16 231 lb (104.8 kg)   Erectile dysfunction He's used Viagra in the past; last filled in February. He  takes half a pill about once a month. He denies blue-tint vision, hearing loss, lightheadedness, chest pain, or shortness of breath. He denies history of heart issues. His mother passed away of heart attack, age 46. His last EKG was in 2013.   Cancer Screening Colonoscopy: He has not had this done yet because his friend had difficulties s/p procedure in the past. He denies Levine in stool. He denies being informed high risk for colon cancer. He agrees to [employer] today.  Prostate cancer screening:  Lab Results  Component Value Date   PSA 0.64 01/13/2014   PSA 0.55 11/12/2012   PSA 0.47 03/11/2012   His father was diagnosed with prostate cancer, in his mid-16s.   Immunizations Immunization History  Administered Date(s) Administered  . Influenza Split 11/19/2011, 10/27/2014, 12/02/2015  . Influenza,inj,Quad PF,36+ Mos 11/12/2012, 01/13/2014  . Tdap 11/12/2012   Shingles: He hasn't had this done yet.   Depression Depression screen [facility] 2/9 09/15/2016 08/15/2016 03/22/2016 02/20/2015  Decreased Interest 0 0 0 0  Down, Depressed, Hopeless 0 0 0 0  PHQ - 2 Score 0 0 0 0    Vision  Visual Acuity Screening   Right eye Left eye Both eyes  Without correction:     With correction: 20/15 20/15 20/20    He's followed by eye doctor, about 6 months ago.   Dentist He sees dentist about every 6 months.  Exercise He denies regular exercise. He does physical work, and his iPhone informed him of walking about 4.5 miles yesterday.   Hep C / HIV screening He has not had these tests done. He agrees to have these done today.   Patient Active Problem List   Diagnosis Date Noted  . HTN (hypertension) 12/10/2011  . Hyperlipidemia 12/10/2011  . Erectile dysfunction 12/10/2011   Past Medical History:  Diagnosis Date  . Allergy   . Arthritis   . Basal cell carcinoma   . Erectile dysfunction   . Hyperlipidemia   . Hypertension    Past Surgical History:  Procedure Laterality Date  . LUMBAR  DISC SURGERY    . SPINE SURGERY     No Known Allergies Prior to Admission medications   Medication Sig Start Date End Date Taking? Authorizing Provider  Ascorbic Acid (VITAMIN C) 100 MG tablet Take 100 mg by mouth daily.    [provider]  benzonatate (TESSALON) 100 MG capsule Take 1 capsule (100 mg total) by mouth 3 (three) times daily as needed for cough. 08/15/16   Wendie Agreste, MD  fluticasone (FLONASE) 50 MCG/ACT nasal spray Place 2 sprays into both nostrils daily. 02/20/15   Posey Boyer, MD  glucosamine-chondroitin 500-400 MG tablet Take 1 tablet by mouth 3 (three) times daily.    [provider]  HYDROcodone-homatropine (HYCODAN) 5-1.5 MG/5ML syrup 47m by mouth a bedtime as needed for cough. 08/15/16   Wendie Agreste, MD  lisinopril-hydrochlorothiazide (PRINZIDE,ZESTORETIC) 20-25 MG tablet Take 1 tablet by mouth daily. 03/22/16   Tenna Delaine D, PA-C  niacin (NIASPAN) 1000 MG CR tablet TAKE 1 TABLET (1,000 MG TOTAL) BY MOUTH AT BEDTIME  "NO MORE REFILLS WITHOUT OFFICE VISIT" 12/30/14   Harrison Mons, PA-C  Olopatadine HCl 0.6 % SOLN  12/24/13   [provider]  pravastatin (PRAVACHOL) 40 MG tablet Take 1 tablet (40 mg total) by mouth daily. 03/22/16   Tenna Delaine D, PA-C  sildenafil (VIAGRA) 100 MG tablet Take 0.5-1 tablets (50-100 mg total) by mouth daily as needed for erectile dysfunction. 03/22/16 04/29/17  Tenna Delaine D, PA-C  zoster vaccine live, PF, (ZOSTAVAX) 71062 UNT/0.65ML injection Inject 19,400 Units into the skin once. 01/13/14   Harrison Mons, PA-C   Social History   Social History  . Marital status: Married    Spouse name: N/A  . Number of children: N/A  . Years of education: N/A   Occupational History  . [employer]   Social History Main Topics  . Smoking status: Former Research scientist (life sciences)  . Smokeless tobacco: Never Used  . Alcohol use No  . Drug use: No  . Sexual activity: Yes   Other Topics Concern  . Not on  file   Social History Narrative   Married. Education: [employer]. Exercise: Walk 2 times a week for 45 minutes.   Review of Systems 13 point ROS - negative    Objective:   Physical Exam  Constitutional: He is oriented to person, place, and time. He appears well-developed and well-nourished.  HENT:  Head: Normocephalic and atraumatic.  Right Ear: External ear normal.  Left Ear: External ear normal.  Mouth/Throat: Oropharynx is clear and moist.  Eyes: Pupils are equal, round, and reactive to light. Conjunctivae and EOM are normal.  Neck: Normal range of motion. Neck supple. No thyromegaly present.  Cardiovascular: Normal rate, regular rhythm, normal heart sounds and intact distal pulses.   Pulmonary/Chest: Effort normal and breath sounds normal. No  respiratory distress. He has no wheezes.  Abdominal: Soft. He exhibits no distension. There is no tenderness. Hernia confirmed negative in the right inguinal area and confirmed negative in the left inguinal area.  Genitourinary: Prostate normal.  Musculoskeletal: Normal range of motion. He exhibits no edema or tenderness.  Lymphadenopathy:    He has no cervical adenopathy.  Neurological: He is alert and oriented to person, place, and time. He has normal reflexes.  Skin: Skin is warm and dry.  Psychiatric: He has a normal mood and affect. His behavior is normal.  Vitals reviewed.    Vitals:   09/15/16 0817 09/15/16 0852  BP: (!) 143/86 118/76  Pulse: 67   Resp: 16   Temp: 98.1 F (36.7 C)   TempSrc: Oral   SpO2: 98%   Weight: 234 lb (106.1 kg)   Height: 6\' 2"  (1.88 m)    EKG: sinus bradycardia rate 58, no acute findings. '  Results for orders placed or performed in visit on 09/15/16  POCT urinalysis dipstick  Result Value Ref Range   Color, UA yellow yellow   Clarity, UA clear clear   Glucose, UA negative negative mg/dL   Bilirubin, UA negative negative   Ketones, POC UA negative negative mg/dL   Spec Grav, UA 1.020  1.010 - 1.025   Levine, UA negative negative   pH, UA 5.0 5.0 - 8.0   Protein Ur, POC negative negative mg/dL   Urobilinogen, UA 0.2 0.2 or 1.0 E.U./dL   Nitrite, UA Negative Negative   Leukocytes, UA Negative Negative       Assessment & Plan:   Roshun Klingensmith is a 65 y.o. male Annual physical exam  - -anticipatory guidance as below in AVS, screening labs above. Health maintenance items as above in HPI discussed/recommended as applicable.   Essential hypertension - Plan: lisinopril-hydrochlorothiazide (PRINZIDE,ZESTORETIC) 20-25 MG tablet, EKG 12-Lead, Comprehensive metabolic panel, POCT urinalysis dipstick  -Stable. No mention changes. Labs pending  Hyperlipidemia, unspecified hyperlipidemia type - Plan: pravastatin (PRAVACHOL) 40 MG tablet, EKG 12-Lead, Lipid panel  - Check labs, continue pravastatin, niacin for hypertriglyceridemia. Tolerating current regimen without myalgias  Cough  - Suspect postinfectious/initial viral cough. Reassuring exam, no red flags on history. Denies any true hemoptysis. If cough not improving next 3-4 weeks, recommend recheck with chest x-Levine, sooner if worse.  Erectile dysfunction, unspecified erectile dysfunction type  - viagra Rx given - use lowest effective dose. Side effects discussed (including but not limited to headache/flushing, blue discoloration of vision, possible vascular steal and risk of cardiac effects if underlying unknown coronary artery disease, and permanent sensorineural hearing loss). Understanding expressed.  Screening for prostate cancer Family history of prostate cancer in father - Plan: PSA - We discussed pros and cons of prostate cancer screening, and after this discussion, he chose to have screening done. PSA obtained, and no concerning findings on DRE.   Screen for colon cancer - Plan: Cologuard  - Discuss colonoscopy, but declined in preference of Cologuard  Need for shingles vaccine - Plan: Zoster Vac Recomb Adjuvanted  [facility]) injection prescription sent to pharmacy.   Encounter for hepatitis C screening test for low risk patient - Plan: Hepatitis C antibody  Screening for HIV (human immunodeficiency virus) - Plan: HIV antibody   Meds ordered this encounter  Medications  . lisinopril-hydrochlorothiazide (PRINZIDE,ZESTORETIC) 20-25 MG tablet    Sig: Take 1 tablet by mouth daily.    Dispense:  90 tablet    Refill:  1  . pravastatin (PRAVACHOL)  40 MG tablet    Sig: Take 1 tablet (40 mg total) by mouth daily.    Dispense:  90 tablet    Refill:  1  . Zoster Vac Recomb Adjuvanted [facility]) injection    Sig: Inject 0.5 mLs into the muscle once. Repeat injection once in 2-6 months.    Dispense:  0.5 mL    Refill:  1   Patient Instructions   Ok to try mucinex for next week or two if needed. If cough is not continuing to resolve in next few weeks, or worsens - return for possible xray or other treatment.   I will order Cologuard for colon cancer screening, prostate cancer testing, hepatitis C, HIV testing, and other Levine tests.  We will let you know results of your labs within the next 2 weeks.  Keeping you healthy  Get these tests  Levine pressure- Have your Levine pressure checked once a year by your healthcare provider.  Normal Levine pressure is 120/80  Weight- Have your body mass index (BMI) calculated to screen for obesity.  BMI is a measure of body fat based on height and weight. You can also calculate your own BMI at [employer].  Cholesterol- Have your cholesterol checked every year.  Diabetes- Have your Levine sugar checked regularly if you have high Levine pressure, high cholesterol, have a family history of diabetes or if you are overweight.  Screening for Colon Cancer- Colonoscopy starting at age 4.  Screening may begin sooner depending on your family history and other health conditions. Follow up colonoscopy as directed by your Gastroenterologist.  Screening for Prostate  Cancer- Both Levine work (PSA) and a rectal exam help screen for Prostate Cancer.  Screening begins at age 23 with African-American men and at age 65 with Caucasian men.  Screening may begin sooner depending on your family history.  Take these medicines  Aspirin- One aspirin daily can help prevent Heart disease and Stroke.  Flu shot- Every fall.  Tetanus- Every 10 years.  Zostavax- Once after the age of 30 to prevent Shingles.  Pneumonia shot- Once after the age of 79; if you are younger than 34, ask your healthcare provider if you need a Pneumonia shot.  Take these steps  Don't smoke- If you do smoke, talk to your doctor about quitting.  For tips on how to quit, go to www.smokefree.gov or call 1-800-QUIT-NOW.  Be physically active- Exercise 5 days a week for at least 30 minutes.  If you are not already physically active start slow and gradually work up to 30 minutes of moderate physical activity.  Examples of moderate activity include walking briskly, mowing the yard, dancing, swimming, bicycling, etc.  Eat a healthy diet- Eat a variety of healthy food such as fruits, vegetables, low fat milk, low fat cheese, yogurt, lean meant, poultry, fish, beans, tofu, etc. For more information go to www.thenutritionsource.org  Drink alcohol in moderation- Limit alcohol intake to less than two drinks a day. Never drink and drive.  Dentist- Brush and floss twice daily; visit your dentist twice a year.  Depression- Your emotional health is as important as your physical health. If you're feeling down, or losing interest in things you would normally enjoy please talk to your healthcare provider.  Eye exam- Visit your eye doctor every year.  Safe sex- If you may be exposed to a sexually transmitted infection, use a condom.  Seat belts- Seat belts can save your life; always wear one.  Smoke/Carbon Building services engineer- Psychologist, sport and exercise  need to be installed on the appropriate level of your home.  Replace  batteries at least once a year.  Skin cancer- When out in the sun, cover up and use sunscreen 15 SPF or higher.  Violence- If anyone is threatening you, please tell your healthcare provider.  Living Will/ Health care power of attorney- Speak with your healthcare provider and family.   IF you received an x-Levine today, you will receive an invoice from [facility] Radiology. Please contact [facility] Radiology at [phone] with questions or concerns regarding your invoice.   IF you received labwork today, you will receive an invoice from [place]. Please contact [facility] at [phone] with questions or concerns regarding your invoice.   Our billing staff will not be able to assist you with questions regarding bills from these companies.  You will be contacted with the lab results as soon as they are available. The fastest way to get your results is to activate your My Chart account. Instructions are located on the last page of this paperwork. If you have not heard from [place] regarding the results in 2 weeks, please contact this office.       I personally performed the services described in this documentation, which was scribed in my presence. The recorded information has been reviewed and considered for accuracy and completeness, addended by me as needed, and agree with information above.  Signed,   Joshua Ray, MD [facility] [place].  09/15/16 11:07 PM

## 2016-09-15 NOTE — Patient Instructions (Addendum)
Ok to try mucinex for next week or two if needed. If cough is not continuing to resolve in next few weeks, or worsens - return for possible xray or other treatment.   I will order Cologuard for colon cancer screening, prostate cancer testing, hepatitis C, HIV testing, and other blood tests.  We will let you know results of your labs within the next 2 weeks.  Keeping you healthy  Get these tests  Blood pressure- Have your blood pressure checked once a year by your healthcare provider.  Normal blood pressure is 120/80  Weight- Have your body mass index (BMI) calculated to screen for obesity.  BMI is a measure of body fat based on height and weight. You can also calculate your own BMI at [email or web address].  Cholesterol- Have your cholesterol checked every year.  Diabetes- Have your blood sugar checked regularly if you have high blood pressure, high cholesterol, have a family history of diabetes or if you are overweight.  Screening for Colon Cancer- Colonoscopy starting at age 1.  Screening may begin sooner depending on your family history and other health conditions. Follow up colonoscopy as directed by your Gastroenterologist.  Screening for Prostate Cancer- Both blood work (PSA) and a rectal exam help screen for Prostate Cancer.  Screening begins at age 36 with African-American men and at age 54 with Caucasian men.  Screening may begin sooner depending on your family history.  Take these medicines  Aspirin- One aspirin daily can help prevent Heart disease and Stroke.  Flu shot- Every fall.  Tetanus- Every 10 years.  Zostavax- Once after the age of 68 to prevent Shingles.  Pneumonia shot- Once after the age of 8; if you are younger than 54, ask your healthcare provider if you need a Pneumonia shot.  Take these steps  Don't smoke- If you do smoke, talk to your doctor about quitting.  For tips on how to quit, go to www.smokefree.gov or call 1-800-QUIT-NOW.  Be physically  active- Exercise 5 days a week for at least 30 minutes.  If you are not already physically active start slow and gradually work up to 30 minutes of moderate physical activity.  Examples of moderate activity include walking briskly, mowing the yard, dancing, swimming, bicycling, etc.  Eat a healthy diet- Eat a variety of healthy food such as fruits, vegetables, low fat milk, low fat cheese, yogurt, lean meant, poultry, fish, beans, tofu, etc. For more information go to www.thenutritionsource.org  Drink alcohol in moderation- Limit alcohol intake to less than two drinks a day. Never drink and drive.  Dentist- Brush and floss twice daily; visit your dentist twice a year.  Depression- Your emotional health is as important as your physical health. If you're feeling down, or losing interest in things you would normally enjoy please talk to your healthcare provider.  Eye exam- Visit your eye doctor every year.  Safe sex- If you may be exposed to a sexually transmitted infection, use a condom.  Seat belts- Seat belts can save your life; always wear one.  Smoke/Carbon Monoxide detectors- These detectors need to be installed on the appropriate level of your home.  Replace batteries at least once a year.  Skin cancer- When out in the sun, cover up and use sunscreen 15 SPF or higher.  Violence- If anyone is threatening you, please tell your healthcare provider.  Living Will/ Health care power of attorney- Speak with your healthcare provider and family.   IF you received an x-ray today, you will  receive an Pharmacologist from [facility]. Please contact [facility] at [phone] with questions or concerns regarding your invoice.   IF you received labwork today, you will receive an invoice from [place]. Please contact [facility] at [phone] with questions or concerns regarding your invoice.   Our billing staff will not be able to assist you with questions regarding bills from these  companies.  You will be contacted with the lab results as soon as they are available. The fastest way to get your results is to activate your My Chart account. Instructions are located on the last page of this paperwork. If you have not heard from [place] regarding the results in 2 weeks, please contact this office.

## 2016-09-16 LAB — COMPREHENSIVE METABOLIC PANEL
ALT: 24 IU/L (ref 0–44)
AST: 25 IU/L (ref 0–40)
Albumin/Globulin Ratio: 1.7 (ref 1.2–2.2)
Albumin: 4.5 g/dL (ref 3.6–4.8)
Alkaline Phosphatase: 91 IU/L (ref 39–117)
BUN/Creatinine Ratio: 16 (ref 10–24)
BUN: 19 mg/dL (ref 8–27)
Bilirubin Total: 0.6 mg/dL (ref 0.0–1.2)
CO2: 22 mmol/L (ref 20–29)
Calcium: 9.5 mg/dL (ref 8.6–10.2)
Chloride: 100 mmol/L (ref 96–106)
Creatinine, Ser: 1.16 mg/dL (ref 0.76–1.27)
GFR calc Af Amer: 77 mL/min/{1.73_m2} (ref >59)
GFR calc non Af Amer: 66 mL/min/{1.73_m2} (ref >59)
Globulin, Total: 2.6 g/dL (ref 1.5–4.5)
Glucose: 99 mg/dL (ref 65–99)
Potassium: 4.5 mmol/L (ref 3.5–5.2)
Sodium: 139 mmol/L (ref 134–144)
Total Protein: 7.1 g/dL (ref 6.0–8.5)

## 2016-09-16 LAB — LIPID PANEL
Chol/HDL Ratio: 5.3 ratio — ABNORMAL HIGH (ref 0.0–5.0)
Cholesterol, Total: 171 mg/dL (ref 100–199)
HDL: 32 mg/dL — ABNORMAL LOW (ref >39)
LDL Calculated: 90 mg/dL (ref 0–99)
Triglycerides: 244 mg/dL — ABNORMAL HIGH (ref 0–149)
VLDL Cholesterol Cal: 49 mg/dL — ABNORMAL HIGH (ref 5–40)

## 2016-09-16 LAB — HEPATITIS C ANTIBODY

## 2016-09-16 LAB — PSA: Prostate Specific Ag, Serum: 0.6 ng/mL (ref 0.0–4.0)

## 2016-09-16 LAB — HIV ANTIBODY (ROUTINE TESTING W REFLEX): HIV SCREEN 4TH GENERATION: NONREACTIVE

## 2016-09-26 ENCOUNTER — Encounter: Payer: Self-pay | Admitting: *Deleted

## 2016-11-29 ENCOUNTER — Other Ambulatory Visit: Payer: Self-pay | Admitting: Physician Assistant

## 2016-11-29 DIAGNOSIS — E785 Hyperlipidemia, unspecified: Secondary | ICD-10-CM

## 2017-02-06 DIAGNOSIS — Z23 Encounter for immunization: Secondary | ICD-10-CM | POA: Diagnosis not present

## 2017-02-08 DIAGNOSIS — N5201 Erectile dysfunction due to arterial insufficiency: Secondary | ICD-10-CM | POA: Diagnosis not present

## 2017-02-15 DIAGNOSIS — N5201 Erectile dysfunction due to arterial insufficiency: Secondary | ICD-10-CM | POA: Diagnosis not present

## 2017-03-23 ENCOUNTER — Other Ambulatory Visit: Payer: Self-pay | Admitting: Family Medicine

## 2017-03-23 DIAGNOSIS — I10 Essential (primary) hypertension: Secondary | ICD-10-CM

## 2017-09-13 ENCOUNTER — Other Ambulatory Visit: Payer: Self-pay | Admitting: Family Medicine

## 2017-09-13 DIAGNOSIS — I10 Essential (primary) hypertension: Secondary | ICD-10-CM

## 2017-09-13 NOTE — Telephone Encounter (Signed)
Attempted to contact pt regarding prescription refill request; last office visit 09/15/16; no upcoming appointments noted; left message on voicemail [phone]  when pt calls back please schedule appointment. l

## 2017-09-14 ENCOUNTER — Other Ambulatory Visit: Payer: Self-pay

## 2017-09-14 ENCOUNTER — Encounter: Payer: Self-pay | Admitting: Family Medicine

## 2017-09-14 ENCOUNTER — Ambulatory Visit: Payer: BLUE CROSS/BLUE SHIELD | Admitting: Family Medicine

## 2017-09-14 VITALS — BP 130/80 | HR 67 | Temp 98.2°F | Ht 75.0 in | Wt 250.8 lb

## 2017-09-14 DIAGNOSIS — N529 Male erectile dysfunction, unspecified: Secondary | ICD-10-CM

## 2017-09-14 DIAGNOSIS — I10 Essential (primary) hypertension: Secondary | ICD-10-CM

## 2017-09-14 DIAGNOSIS — E785 Hyperlipidemia, unspecified: Secondary | ICD-10-CM

## 2017-09-14 DIAGNOSIS — Z23 Encounter for immunization: Secondary | ICD-10-CM

## 2017-09-14 MED ORDER — LISINOPRIL-HYDROCHLOROTHIAZIDE 20-25 MG PO TABS: 1.0000 | ORAL_TABLET | Freq: Every day | ORAL | 1 refills | Status: DC

## 2017-09-14 MED ORDER — PRAVASTATIN SODIUM 40 MG PO TABS: 40.0000 mg | ORAL_TABLET | Freq: Every day | ORAL | 1 refills | Status: DC

## 2017-09-14 MED ORDER — SILDENAFIL CITRATE 100 MG PO TABS: 50.0000 mg | ORAL_TABLET | Freq: Every day | ORAL | 5 refills | Status: DC | PRN

## 2017-09-14 NOTE — Progress Notes (Signed)
Subjective:  By signing my name below, I, Essence Howell, attest that this documentation has been prepared under the direction and in the presence of Wendie Agreste, MD Electronically Signed: Ladene Artist, ED Scribe 09/14/2017 at 5:57 PM.   Patient ID: Joshua Levine, male    DOB: [birth year], 67 y.o.   MRN: [id number]  Chief Complaint  Patient presents with  . Medication Refill    Bp and Cholesterol    HPI Joshua Levine is a 67 y.o. male who presents to Primary Care at [facility] for me refill. Last seen 08/2016. Pt last ate around noon today.  HTN Lab Results  Component Value Date   CREATININE 1.16 09/15/2016   BP Readings from Last 3 Encounters:  09/14/17 130/80  09/15/16 118/76  08/15/16 134/80  Lisinopril HCTZ 20-25. - Denies new side-effects from meds.  Hyperlipidemia Lab Results  Component Value Date   CHOL 171 09/15/2016   HDL 32 (L) 09/15/2016   LDLCALC 90 09/15/2016   TRIG 244 (H) 09/15/2016   CHOLHDL 5.3 (H) 09/15/2016   Lab Results  Component Value Date   ALT 24 09/15/2016   AST 25 09/15/2016   ALKPHOS 91 09/15/2016   BILITOT 0.6 09/15/2016  Pravastatin 40 mg qd and OTC Niacin. - Pt has noticed occasional nighttime muscle cramps, ~ once every other month, with the last episode being last night. Denies new arthralgias or new side-effects from meds.  ED Pt occasionally takes sildenafil. Denies cyanopsia, visual disturbances, decreased hearing, HA, flushing, sob, cp, chest tightness.  Health Maintenance Due for colonoscopy which pt declines referral for at this time. Pneumonia: updated at this visit.  Patient Active Problem List   Diagnosis Date Noted  . HTN (hypertension) 12/10/2011  . Hyperlipidemia 12/10/2011  . Erectile dysfunction 12/10/2011   Past Medical History:  Diagnosis Date  . Allergy   . Arthritis   . Basal cell carcinoma   . Erectile dysfunction   . Hyperlipidemia   . Hypertension    Past Surgical History:  Procedure Laterality  Date  . LUMBAR DISC SURGERY    . SPINE SURGERY     No Known Allergies Prior to Admission medications   Medication Sig Start Date End Date Taking? Authorizing Provider  Ascorbic Acid (VITAMIN C) 100 MG tablet Take 100 mg by mouth daily.   Yes [provider]  fluticasone (FLONASE) 50 MCG/ACT nasal spray Place 2 sprays into both nostrils daily. 02/20/15  Yes Posey Boyer, MD  glucosamine-chondroitin 500-400 MG tablet Take 1 tablet by mouth 3 (three) times daily.   Yes [provider]  lisinopril-hydrochlorothiazide (PRINZIDE,ZESTORETIC) 20-25 MG tablet TAKE 1 TABLET BY MOUTH EVERY DAY 09/13/17  Yes Wendie Agreste, MD  niacin (NIASPAN) 1000 MG CR tablet TAKE 1 TABLET (1,000 MG TOTAL) BY MOUTH AT BEDTIME  "NO MORE REFILLS WITHOUT OFFICE VISIT" 12/30/14  Yes Jacqulynn Cadet, Chelle, PA-C  Olopatadine HCl 0.6 % SOLN  12/24/13  Yes [provider]  pravastatin (PRAVACHOL) 40 MG tablet Take 1 tablet (40 mg total) by mouth daily. 09/15/16  Yes Wendie Agreste, MD  pravastatin (PRAVACHOL) 40 MG tablet TAKE 1 TABLET BY MOUTH EVERY DAY 11/30/16  Yes Wendie Agreste, MD  zoster vaccine live, PF, (ZOSTAVAX) 65035 UNT/0.65ML injection Inject 19,400 Units into the skin once. 01/13/14  Yes Jeffery, Chelle, PA-C  sildenafil (VIAGRA) 100 MG tablet Take 0.5-1 tablets (50-100 mg total) by mouth daily as needed for erectile dysfunction. 03/22/16 04/29/17  Leonie Douglas, PA-C  Social History   Socioeconomic History  . Marital status: Married    Spouse name: Not on file  . Number of children: Not on file  . Years of education: Not on file  . Highest education level: Not on file  Occupational History  . Occupation: Public librarian: [employer]  Social Needs  . Financial resource strain: Not on file  . Food insecurity:    Worry: Not on file    Inability: Not on file  . Transportation needs:    Medical: Not on file    Non-medical: Not on file  Tobacco Use  . Smoking  status: Former Research scientist (life sciences)  . Smokeless tobacco: Never Used  Substance and Sexual Activity  . Alcohol use: No  . Drug use: No  . Sexual activity: Yes  Lifestyle  . Physical activity:    Days per week: Not on file    Minutes per session: Not on file  . Stress: Not on file  Relationships  . Social connections:    Talks on phone: Not on file    Gets together: Not on file    Attends religious service: Not on file    Active member of club or organization: Not on file    Attends meetings of clubs or organizations: Not on file    Relationship status: Not on file  . Intimate partner violence:    Fear of current or ex partner: Not on file    Emotionally abused: Not on file    Physically abused: Not on file    Forced sexual activity: Not on file  Other Topics Concern  . Not on file  Social History Narrative   Married. Education: [employer]. Exercise: Walk 2 times a week for 45 minutes.   Review of Systems  Constitutional: Negative for fatigue and unexpected weight change.  HENT: Negative for hearing loss.   Eyes: Negative for visual disturbance.  Respiratory: Negative for cough, chest tightness and shortness of breath.   Cardiovascular: Negative for chest pain, palpitations and leg swelling.  Gastrointestinal: Negative for abdominal pain and blood in stool.  Musculoskeletal: Positive for myalgias (occasional cramps). Negative for arthralgias.  Neurological: Negative for dizziness, light-headedness and headaches.      Objective:   Physical Exam  Constitutional: He is oriented to person, place, and time. He appears well-developed and well-nourished.  HENT:  Head: Normocephalic and atraumatic.  Eyes: Pupils are equal, round, and reactive to light. EOM are normal.  Neck: No JVD present. Carotid bruit is not present.  Cardiovascular: Normal rate, regular rhythm and normal heart sounds.  No murmur heard. Pulmonary/Chest: Effort normal and breath sounds normal. He has no rales.    Musculoskeletal: He exhibits no edema.  Neurological: He is alert and oriented to person, place, and time.  Skin: Skin is warm and dry.  Psychiatric: He has a normal mood and affect.  Vitals reviewed.  Vitals:   09/14/17 1719  BP: 130/80  Pulse: 67  Temp: 98.2 F (36.8 C)  TempSrc: Oral  SpO2: 98%  Weight: 250 lb 12.8 oz (113.8 kg)  Height: 6\' 3"  (1.905 m)      Assessment & Plan:   Joshua Levine is a 67 y.o. male Hyperlipidemia, unspecified hyperlipidemia type - Plan: Lipid panel, pravastatin (PRAVACHOL) 40 MG tablet  -  Stable, tolerating current regimen. Medications refilled. Labs pending as above.   - if myalgias return. Discussed possible relation to statin - rtc precautions.   Need for pneumococcal vaccine -  Plan: Pneumococcal conjugate vaccine 13-valent IM given, recommended shingrix at pharmacy.  Erectile dysfunction, unspecified erectile dysfunction type - Plan: sildenafil (VIAGRA) 100 MG tablet  - viagra Rx given - use lowest effective dose. Side effects discussed (including but not limited to headache/flushing, blue discoloration of vision, possible vascular steal and risk of cardiac effects if underlying unknown coronary artery disease, and permanent sensorineural hearing loss). Understanding expressed.  Essential hypertension - Plan: Comprehensive metabolic panel, lisinopril-hydrochlorothiazide (PRINZIDE,ZESTORETIC) 20-25 MG tablet  -  Stable, tolerating current regimen. Medications refilled. Labs pending as above.    Meds ordered this encounter  Medications  . pravastatin (PRAVACHOL) 40 MG tablet    Sig: Take 1 tablet (40 mg total) by mouth daily.    Dispense:  90 tablet    Refill:  1  . sildenafil (VIAGRA) 100 MG tablet    Sig: Take 0.5-1 tablets (50-100 mg total) by mouth daily as needed for erectile dysfunction.    Dispense:  5 tablet    Refill:  5  . lisinopril-hydrochlorothiazide (PRINZIDE,ZESTORETIC) 20-25 MG tablet    Sig: Take 1 tablet by mouth  daily.    Dispense:  90 tablet    Refill:  1   Patient Instructions   First of 2 pneumonia vaccines was given today.  Shingles vaccine can be given at your pharmacy. We can discuss other immunizations at [facility] wellness exam.  Please schedule that within the next 6 months.  No change in blood pressure or cholesterol medications today.  If you do notice muscle aches, follow-up to discuss other testing or possible change in medications as that could be related to your cholesterol medications.  Sildenafil refilled at same dose.  Watch for side effects as we discussed.  Thank you for coming in today.   If you have lab work done today you will be contacted with your lab results within the next 2 weeks.  If you have not heard from [place] then please contact us. The fastest way to get your results is to register for My Chart.   IF you received an x-ray today, you will receive an invoice from [facility]. Please contact [facility] at [phone] with questions or concerns regarding your invoice.   IF you received labwork today, you will receive an invoice from [place]. Please contact [facility] at [phone] with questions or concerns regarding your invoice.   Our billing staff will not be able to assist you with questions regarding bills from these companies.  You will be contacted with the lab results as soon as they are available. The fastest way to get your results is to activate your My Chart account. Instructions are located on the last page of this paperwork. If you have not heard from [place] regarding the results in 2 weeks, please contact this office.       I personally performed the services described in this documentation, which was scribed in my presence. The recorded information has been reviewed and considered for accuracy and completeness, addended by me as needed, and agree with information above.  Signed,   [name], MD Primary Care at [place].  09/20/17 2:12 PM

## 2017-09-14 NOTE — Patient Instructions (Addendum)
First of 2 pneumonia vaccines was given today.  Shingles vaccine can be given at your pharmacy. We can discuss other immunizations at [facility] wellness exam.  Please schedule that within the next 6 months.  No change in blood pressure or cholesterol medications today.  If you do notice muscle aches, follow-up to discuss other testing or possible change in medications as that could be related to your cholesterol medications.  Sildenafil refilled at same dose.  Watch for side effects as we discussed.  Thank you for coming in today.   If you have lab work done today you will be contacted with your lab results within the next 2 weeks.  If you have not heard from [place] then please contact us. The fastest way to get your results is to register for My Chart.   IF you received an x-ray today, you will receive an invoice from [facility]. Please contact [facility] at [phone] with questions or concerns regarding your invoice.   IF you received labwork today, you will receive an invoice from [place]. Please contact [facility] at [phone] with questions or concerns regarding your invoice.   Our billing staff will not be able to assist you with questions regarding bills from these companies.  You will be contacted with the lab results as soon as they are available. The fastest way to get your results is to activate your My Chart account. Instructions are located on the last page of this paperwork. If you have not heard from [place] regarding the results in 2 weeks, please contact this office.

## 2017-09-15 LAB — COMPREHENSIVE METABOLIC PANEL
ALBUMIN: 4.6 g/dL (ref 3.6–4.8)
ALT: 42 IU/L (ref 0–44)
AST: 29 IU/L (ref 0–40)
Albumin/Globulin Ratio: 1.8 (ref 1.2–2.2)
Alkaline Phosphatase: 78 IU/L (ref 39–117)
BUN / CREAT RATIO: 17 (ref 10–24)
BUN: 18 mg/dL (ref 8–27)
Bilirubin Total: 0.4 mg/dL (ref 0.0–1.2)
CO2: 21 mmol/L (ref 20–29)
CREATININE: 1.04 mg/dL (ref 0.76–1.27)
Calcium: 9.5 mg/dL (ref 8.6–10.2)
Chloride: 100 mmol/L (ref 96–106)
GFR calc non Af Amer: 75 mL/min/{1.73_m2} (ref >59)
GFR, EST AFRICAN AMERICAN: 87 mL/min/{1.73_m2} (ref >59)
GLUCOSE: 89 mg/dL (ref 65–99)
Globulin, Total: 2.6 g/dL (ref 1.5–4.5)
Potassium: 4 mmol/L (ref 3.5–5.2)
Sodium: 138 mmol/L (ref 134–144)
TOTAL PROTEIN: 7.2 g/dL (ref 6.0–8.5)

## 2017-09-15 LAB — LIPID PANEL
Chol/HDL Ratio: 5.6 ratio — ABNORMAL HIGH (ref 0.0–5.0)
Cholesterol, Total: 161 mg/dL (ref 100–199)
HDL: 29 mg/dL — AB (ref >39)
LDL CALC: 88 mg/dL (ref 0–99)
Triglycerides: 221 mg/dL — ABNORMAL HIGH (ref 0–149)
VLDL CHOLESTEROL CAL: 44 mg/dL — AB (ref 5–40)

## 2017-10-04 ENCOUNTER — Encounter: Payer: Self-pay | Admitting: *Deleted

## 2017-12-12 DIAGNOSIS — L570 Actinic keratosis: Secondary | ICD-10-CM | POA: Diagnosis not present

## 2017-12-12 DIAGNOSIS — L821 Other seborrheic keratosis: Secondary | ICD-10-CM | POA: Diagnosis not present

## 2017-12-12 DIAGNOSIS — Z85828 Personal history of other malignant neoplasm of skin: Secondary | ICD-10-CM | POA: Diagnosis not present

## 2017-12-12 DIAGNOSIS — D1801 Hemangioma of skin and subcutaneous tissue: Secondary | ICD-10-CM | POA: Diagnosis not present

## 2017-12-13 ENCOUNTER — Ambulatory Visit (INDEPENDENT_AMBULATORY_CARE_PROVIDER_SITE_OTHER): Payer: BLUE CROSS/BLUE SHIELD | Admitting: Family Medicine

## 2017-12-13 ENCOUNTER — Encounter: Payer: Self-pay | Admitting: Family Medicine

## 2017-12-13 ENCOUNTER — Other Ambulatory Visit: Payer: Self-pay

## 2017-12-13 VITALS — BP 135/90 | HR 82 | Temp 97.9°F | Resp 16 | Ht 75.0 in | Wt 254.4 lb

## 2017-12-13 DIAGNOSIS — Z0000 Encounter for general adult medical examination without abnormal findings: Secondary | ICD-10-CM

## 2017-12-13 DIAGNOSIS — Z136 Encounter for screening for cardiovascular disorders: Secondary | ICD-10-CM | POA: Diagnosis not present

## 2017-12-13 DIAGNOSIS — Z1211 Encounter for screening for malignant neoplasm of colon: Secondary | ICD-10-CM | POA: Diagnosis not present

## 2017-12-13 DIAGNOSIS — Z23 Encounter for immunization: Secondary | ICD-10-CM

## 2017-12-13 DIAGNOSIS — Z125 Encounter for screening for malignant neoplasm of prostate: Secondary | ICD-10-CM | POA: Diagnosis not present

## 2017-12-13 MED ORDER — ZOSTER VAC RECOMB ADJUVANTED 50 MCG/0.5ML IM SUSR: 0.5000 mL | Freq: Once | INTRAMUSCULAR | 1 refills | Status: AC

## 2017-12-13 NOTE — Progress Notes (Signed)
Subjective:  By signing my name below, I, Joshua Levine, attest that this documentation has been prepared under the direction and in the presence of [name], MD. Electronically Signed: [name], [place]. 12/13/2017 , 9:56 AM .  Patient was seen in Room 13 .   Patient ID: Joshua Levine, male    DOB: 28-Sep-1950, 67 y.o.   MRN: [id number] Chief Complaint  Patient presents with  . Medicare Wellness   HPI Joshua Levine is a 67 y.o. male Here for Welcome to Medicare Wellness physical. He has a history of HTN, hyperlipidemia, and erectile dysfunction.   Right wrist soreness He mentions having soreness in his right wrist ongoing for about 6 weeks now. He notes having a history of arthritis, but also possibly due to overuse at work. He describes having some tingling, but denies any numbness. He has been wearing a brace over right wrist.   Cardio He agrees to EKG today; last done on Aug 2018. He denies chest pain, shortness of breath, lightheadedness, or dizziness.   HTN BP Readings from Last 3 Encounters:  12/13/17 135/90  09/14/17 130/80  09/15/16 118/76   Lab Results  Component Value Date   CREATININE 1.04 09/14/2017   He takes lisinopril-HCTZ 20-25 mg qd. He denies any side effects.   Hyperlipidemia Lab Results  Component Value Date   CHOL 161 09/14/2017   HDL 29 (L) 09/14/2017   LDLCALC 88 09/14/2017   TRIG 221 (H) 09/14/2017   CHOLHDL 5.6 (H) 09/14/2017   Lab Results  Component Value Date   ALT 42 09/14/2017   AST 29 09/14/2017   ALKPHOS 78 09/14/2017   BILITOT 0.4 09/14/2017   He takes pravastatin 40 mg qd. Recheck lipids in 6 months. He denies any new muscle aches.   Erectile Dysfunction He was prescribed Viagra 100 mg, half to full tablet as needed; refilled in August.   Cancer Screening Colonoscopy: he denies history of colonoscopy; requests ColoGuard.  Prostate cancer screening: PSA in Aug 2018 was normal at 0.6.  Lab Results  Component Value  Date   PSA1 0.6 09/15/2016    Immunizations Immunization History  Administered Date(s) Administered  . Influenza Split 11/19/2011, 10/27/2014, 12/02/2015  . Influenza,inj,Quad PF,6+ Mos 11/12/2012, 01/13/2014  . Pneumococcal Conjugate-13 09/14/2017  . Tdap 11/12/2012   Shingles: he hasn't updated Shingrix.  Flu shot: Agrees to high dose flu shot today.  Plan on pneumovax in 6-12 months.   Fall screening Fall Risk  12/13/2017 09/14/2017 09/15/2016 08/15/2016 03/22/2016  Falls in the past year? 0 No No No No    Depression Depression screen [facility] 2/9 12/13/2017 09/14/2017 09/15/2016 08/15/2016 03/22/2016  Decreased Interest 0 0 0 0 0  Down, Depressed, Hopeless 0 0 0 0 0  PHQ - 2 Score 0 0 0 0 0   HIV + Hep C screening He had negative screening in Aug 2018.   Vision  Visual Acuity Screening   Right eye Left eye Both eyes  Without correction:     With correction: _0   Eye doctor: last visit was in Jan 2019. He's wearing glasses, "gave up contacts full time now."   Dentist He's followed by dentist regularly, seen every 6 months.   Exercise He denies regular exercise due to feeling beat after work. On the weekends, he is "recuperating from work."   Functional Status Survey Is the patient deaf or have difficulty hearing?: No Does the patient have difficulty seeing, even when wearing glasses/contacts?: Yes Does  the patient have difficulty concentrating, remembering, or making decisions?: No Does the patient have difficulty walking or climbing stairs?: No Does the patient have difficulty dressing or bathing?: No Does the patient have difficulty doing errands alone such as visiting a doctor's office or shopping?: No    Mental Status Survey 6CIT Screen 12/13/2017  What Year? 0 points  What month? 0 points  What time? 0 points  Count back from 20 0 points  Months in reverse 0 points  Repeat phrase 0 points  Total Score 0    Advanced Directives He has a living  will; plans to bring in a copy.   Patient Active Problem List   Diagnosis Date Noted  . HTN (hypertension) 12/10/2011  . Hyperlipidemia 12/10/2011  . Erectile dysfunction 12/10/2011   Past Medical History:  Diagnosis Date  . Allergy   . Arthritis   . Basal cell carcinoma   . Erectile dysfunction   . Hyperlipidemia   . Hypertension    Past Surgical History:  Procedure Laterality Date  . LUMBAR DISC SURGERY    . SPINE SURGERY     No Known Allergies Prior to Admission medications   Medication Sig Start Date End Date Taking? Authorizing Provider  Ascorbic Acid (VITAMIN C) 100 MG tablet Take 100 mg by mouth daily.    [provider]  fluticasone (FLONASE) 50 MCG/ACT nasal spray Place 2 sprays into both nostrils daily. 02/20/15   Posey Boyer, MD  glucosamine-chondroitin 500-400 MG tablet Take 1 tablet by mouth 3 (three) times daily.    [provider]  lisinopril-hydrochlorothiazide (PRINZIDE,ZESTORETIC) 20-25 MG tablet Take 1 tablet by mouth daily. 09/14/17   Wendie Agreste, MD  niacin (NIASPAN) 1000 MG CR tablet TAKE 1 TABLET (1,000 MG TOTAL) BY MOUTH AT BEDTIME  "NO MORE REFILLS WITHOUT OFFICE VISIT" 12/30/14   Harrison Mons, PA  Olopatadine HCl 0.6 % SOLN  12/24/13   [provider]  pravastatin (PRAVACHOL) 40 MG tablet Take 1 tablet (40 mg total) by mouth daily. 09/14/17   Wendie Agreste, MD  sildenafil (VIAGRA) 100 MG tablet Take 0.5-1 tablets (50-100 mg total) by mouth daily as needed for erectile dysfunction. 09/14/17 10/22/18  Wendie Agreste, MD  zoster vaccine live, PF, (ZOSTAVAX) 96283 UNT/0.65ML injection Inject 19,400 Units into the skin once. 01/13/14   Harrison Mons, PA   Social History   Socioeconomic History  . Marital status: Married    Spouse name: Not on file  . Number of children: Not on file  . Years of education: Not on file  . Highest education level: Not on file  Occupational History  . Occupation: Information systems manager: [employer]  Social Needs  . Financial resource strain: Not on file  . Food insecurity:    Worry: Not on file    Inability: Not on file  . Transportation needs:    Medical: Not on file    Non-medical: Not on file  Tobacco Use  . Smoking status: Former Research scientist (life sciences)  . Smokeless tobacco: Never Used  Substance and Sexual Activity  . Alcohol use: No  . Drug use: No  . Sexual activity: Yes  Lifestyle  . Physical activity:    Days per week: Not on file    Minutes per session: Not on file  . Stress: Not on file  Relationships  . Social connections:    Talks on phone: Not on file    Gets together: Not on file  Attends religious service: Not on file    Active member of club or organization: Not on file    Attends meetings of clubs or organizations: Not on file    Relationship status: Not on file  . Intimate partner violence:    Fear of current or ex partner: Not on file    Emotionally abused: Not on file    Physically abused: Not on file    Forced sexual activity: Not on file  Other Topics Concern  . Not on file  Social History Narrative   Married. Education: [employer]. Exercise: Walk 2 times a week for 45 minutes.    Review of Systems 13 point ROS - negative     Objective:   Physical Exam  Constitutional: He is oriented to person, place, and time. He appears well-developed and well-nourished.  HENT:  Head: Normocephalic and atraumatic.  Right Ear: External ear normal.  Left Ear: External ear normal.  Mouth/Throat: Oropharynx is clear and moist.  Right canal: minimum cerumen, non obstructive  Eyes: Pupils are equal, round, and reactive to light. Conjunctivae and EOM are normal.  Neck: Normal range of motion. Neck supple. No thyromegaly present.  Cardiovascular: Normal rate, regular rhythm, normal heart sounds and intact distal pulses.  Pulmonary/Chest: Effort normal and breath sounds normal. No respiratory distress. He has no wheezes.  Abdominal: Soft. He exhibits  no distension. There is no tenderness. Hernia confirmed negative in the right inguinal area and confirmed negative in the left inguinal area.  Genitourinary: Prostate normal.  Musculoskeletal: Normal range of motion. He exhibits no edema or tenderness.  Right wrist: Negative tinel's, negative phalen's, negative finkelstein, no bony tenderness including scaphoid, full ROM  Lymphadenopathy:    He has no cervical adenopathy.  Neurological: He is alert and oriented to person, place, and time. He has normal reflexes.  Skin: Skin is warm and dry.  Psychiatric: He has a normal mood and affect. His behavior is normal.  Vitals reviewed.    Vitals:   12/13/17 0908 12/13/17 0909  BP: (!) 142/90 135/90  Pulse: 82   Resp: 16   Temp: 97.9 F (36.6 C)   TempSrc: Oral   SpO2: 98%   Weight: 254 lb 6.4 oz (115.4 kg)   Height: _0  (1.905 m)    EKG: sinus rhythm, non specific T-wave in lead iii with Q in iii only, no other apparent changes or acute findings from 2018.     Assessment & Plan:    Joshua Levine is a 67 y.o. male Welcome to Medicare preventive visit  Need for shingles vaccine - Plan: Zoster Vaccine Adjuvanted [facility]) injection  Needs flu shot - Plan: Flu vaccine HIGH DOSE PF (Fluzone High dose)  Special screening, prostate cancer  Special screening for malignant neoplasms, colon - Plan: Cologuard  Screening for cardiovascular condition - Plan: EKG 12-Lead  - anticipatory guidance as below in AVS, screening labs if needed. Health maintenance items as above in HPI discussed/recommended as applicable.  - no concerning responses on depression, fall, or functional status screening. Any positive responses noted as above. Advanced directives discussed as in CH[facility].  -We discussed pros and cons of prostate cancer screening, and after this discussion, he chose to have screening done. PSA obtained, and no concerning findings on DRE.  -May have component of arthritis in the right wrist,  carpal tunnel testing and de Quervain's testing was normal.,  Over-the-counter Tylenol discussed, recheck next 4 to 6 weeks if not improving for possible imaging. -  Plan follow-up for repeat lipid testing and hypertension eval in the next 4 to 6 months.     Meds ordered this encounter  Medications  . Zoster Vaccine Adjuvanted [facility]) injection    Sig: Inject 0.5 mLs into the muscle once for 1 dose. Repeat in 2-6 months.    Dispense:  0.5 mL    Refill:  1   Patient Instructions   Shingles vaccine sent to your pharmacy  Exercise most days per week, even walking for 15 minutes at a time. Goal 150 minutes per week.   cologuard ordered.   Flu shot given today.   Wrist pain may be due to arthritis.  Tylenol is okay.  Follow-up with me if that pain continues.   Preventive Care 56 Years and Older, Male Preventive care refers to lifestyle choices and visits with your health care provider that can promote health and wellness. What does preventive care include?  A yearly physical exam. This is also called an annual well check.  Dental exams once or twice a year.  Routine eye exams. Ask your health care provider how often you should have your eyes checked.  Personal lifestyle choices, including: ? Daily care of your teeth and gums. ? Regular physical activity. ? Eating a healthy diet. ? Avoiding tobacco and drug use. ? Limiting alcohol use. ? Practicing safe sex. ? Taking low doses of aspirin every day. ? Taking vitamin and mineral supplements as recommended by your health care provider. What happens during an annual well check? The services and screenings done by your health care provider during your annual well check will depend on your age, overall health, lifestyle risk factors, and family history of disease. Counseling Your health care provider may ask you questions about your:  Alcohol use.  Tobacco use.  Drug use.  Emotional well-being.  Home and relationship  well-being.  Sexual activity.  Eating habits.  History of falls.  Memory and ability to understand (cognition).  Work and work Statistician.  Screening You may have the following tests or measurements:  Height, weight, and BMI.  Levine pressure.  Lipid and cholesterol levels. These may be checked every 5 years, or more frequently if you are over 3 years old.  Skin check.  Lung cancer screening. You may have this screening every year starting at age 59 if you have a 30-pack-year history of smoking and currently smoke or have quit within the past 15 years.  Fecal occult Levine test (FOBT) of the stool. You may have this test every year starting at age 75.  Flexible sigmoidoscopy or colonoscopy. You may have a sigmoidoscopy every 5 years or a colonoscopy every 10 years starting at age 24.  Prostate cancer screening. Recommendations will vary depending on your family history and other risks.  Hepatitis C Levine test.  Hepatitis B Levine test.  Sexually transmitted disease (STD) testing.  Diabetes screening. This is done by checking your Levine sugar (glucose) after you have not eaten for a while (fasting). You may have this done every 1-3 years.  Abdominal aortic aneurysm (AAA) screening. You may need this if you are a current or former smoker.  Osteoporosis. You may be screened starting at age 59 if you are at high risk.  Talk with your health care provider about your test results, treatment options, and if necessary, the need for more tests. Vaccines Your health care provider may recommend certain vaccines, such as:  Influenza vaccine. This is recommended every year.  Tetanus, diphtheria, and  acellular pertussis (Tdap, Td) vaccine. You may need a Td booster every 10 years.  Varicella vaccine. You may need this if you have not been vaccinated.  Zoster vaccine. You may need this after age 3.  Measles, mumps, and rubella (MMR) vaccine. You may need at least one dose of  MMR if you were born in 1957 or later. You may also need a second dose.  Pneumococcal 13-valent conjugate (PCV13) vaccine. One dose is recommended after age 62.  Pneumococcal polysaccharide (PPSV23) vaccine. One dose is recommended after age 58.  Meningococcal vaccine. You may need this if you have certain conditions.  Hepatitis A vaccine. You may need this if you have certain conditions or if you travel or work in places where you may be exposed to hepatitis A.  Hepatitis B vaccine. You may need this if you have certain conditions or if you travel or work in places where you may be exposed to hepatitis B.  Haemophilus influenzae type b (Hib) vaccine. You may need this if you have certain risk factors.  Talk to your health care provider about which screenings and vaccines you need and how often you need them. This information is not intended to replace advice given to you by your health care provider. Make sure you discuss any questions you have with your health care provider. Document Released: 02/13/2015 Document Revised: 10/07/2015 Document Reviewed: 11/18/2014 Elsevier Interactive Patient Education  [employer].    If you have lab work done today you will be contacted with your lab results within the next 2 weeks.  If you have not heard from [place] then please contact us. The fastest way to get your results is to register for My Chart.   IF you received an x-ray today, you will receive an invoice from [facility]. Please contact [facility] at [phone] with questions or concerns regarding your invoice.   IF you received labwork today, you will receive an invoice from [place]. Please contact [facility] at [phone] with questions or concerns regarding your invoice.   Our billing staff will not be able to assist you with questions regarding bills from these companies.  You will be contacted with the lab results as soon as they are available. The fastest way  to get your results is to activate your My Chart account. Instructions are located on the last page of this paperwork. If you have not heard from [place] regarding the results in 2 weeks, please contact this office.       I personally performed the services described in this documentation, which was scribed in my presence. The recorded information has been reviewed and considered for accuracy and completeness, addended by me as needed, and agree with information above.  Signed,   [name], MD [facility].  12/13/17 10:35 AM

## 2017-12-13 NOTE — Patient Instructions (Addendum)
Shingles vaccine sent to your pharmacy  Exercise most days per week, even walking for 15 minutes at a time. Goal 150 minutes per week.   cologuard ordered.   Flu shot given today.   Wrist pain may be due to arthritis.  Tylenol is okay.  Follow-up with me if that pain continues.   Preventive Care 75 Years and Older, Male Preventive care refers to lifestyle choices and visits with your health care provider that can promote health and wellness. What does preventive care include?  A yearly physical exam. This is also called an annual well check.  Dental exams once or twice a year.  Routine eye exams. Ask your health care provider how often you should have your eyes checked.  Personal lifestyle choices, including: ? Daily care of your teeth and gums. ? Regular physical activity. ? Eating a healthy diet. ? Avoiding tobacco and drug use. ? Limiting alcohol use. ? Practicing safe sex. ? Taking low doses of aspirin every day. ? Taking vitamin and mineral supplements as recommended by your health care provider. What happens during an annual well check? The services and screenings done by your health care provider during your annual well check will depend on your age, overall health, lifestyle risk factors, and family history of disease. Counseling Your health care provider may ask you questions about your:  Alcohol use.  Tobacco use.  Drug use.  Emotional well-being.  Home and relationship well-being.  Sexual activity.  Eating habits.  History of falls.  Memory and ability to understand (cognition).  Work and work Statistician.  Screening You may have the following tests or measurements:  Height, weight, and BMI.  Blood pressure.  Lipid and cholesterol levels. These may be checked every 5 years, or more frequently if you are over 24 years old.  Skin check.  Lung cancer screening. You may have this screening every year starting at age 13 if you have a  30-pack-year history of smoking and currently smoke or have quit within the past 15 years.  Fecal occult blood test (FOBT) of the stool. You may have this test every year starting at age 87.  Flexible sigmoidoscopy or colonoscopy. You may have a sigmoidoscopy every 5 years or a colonoscopy every 10 years starting at age 67.  Prostate cancer screening. Recommendations will vary depending on your family history and other risks.  Hepatitis C blood test.  Hepatitis B blood test.  Sexually transmitted disease (STD) testing.  Diabetes screening. This is done by checking your blood sugar (glucose) after you have not eaten for a while (fasting). You may have this done every 1-3 years.  Abdominal aortic aneurysm (AAA) screening. You may need this if you are a current or former smoker.  Osteoporosis. You may be screened starting at age 66 if you are at high risk.  Talk with your health care provider about your test results, treatment options, and if necessary, the need for more tests. Vaccines Your health care provider may recommend certain vaccines, such as:  Influenza vaccine. This is recommended every year.  Tetanus, diphtheria, and acellular pertussis (Tdap, Td) vaccine. You may need a Td booster every 10 years.  Varicella vaccine. You may need this if you have not been vaccinated.  Zoster vaccine. You may need this after age 37.  Measles, mumps, and rubella (MMR) vaccine. You may need at least one dose of MMR if you were born in 1957 or later. You may also need a second dose.  Pneumococcal  13-valent conjugate (PCV13) vaccine. One dose is recommended after age 32.  Pneumococcal polysaccharide (PPSV23) vaccine. One dose is recommended after age 25.  Meningococcal vaccine. You may need this if you have certain conditions.  Hepatitis A vaccine. You may need this if you have certain conditions or if you travel or work in places where you may be exposed to hepatitis A.  Hepatitis B  vaccine. You may need this if you have certain conditions or if you travel or work in places where you may be exposed to hepatitis B.  Haemophilus influenzae type b (Hib) vaccine. You may need this if you have certain risk factors.  Talk to your health care provider about which screenings and vaccines you need and how often you need them. This information is not intended to replace advice given to you by your health care provider. Make sure you discuss any questions you have with your health care provider. Document Released: 02/13/2015 Document Revised: 10/07/2015 Document Reviewed: 11/18/2014 Elsevier Interactive Patient Education  [employer].    If you have lab work done today you will be contacted with your lab results within the next 2 weeks.  If you have not heard from [place] then please contact us. The fastest way to get your results is to register for My Chart.   IF you received an x-ray today, you will receive an invoice from [facility]. Please contact [facility]iology at [phone] with questions or concerns regarding your invoice.   IF you received labwork today, you will receive an invoice from [place]. Please contact [facility] at [phone] with questions or concerns regarding your invoice.   Our billing staff will not be able to assist you with questions regarding bills from these companies.  You will be contacted with the lab results as soon as they are available. The fastest way to get your results is to activate your My Chart account. Instructions are located on the last page of this paperwork. If you have not heard from [place] regarding the results in 2 weeks, please contact this office.

## 2018-03-13 ENCOUNTER — Other Ambulatory Visit: Payer: Self-pay | Admitting: Family Medicine

## 2018-03-13 DIAGNOSIS — E785 Hyperlipidemia, unspecified: Secondary | ICD-10-CM

## 2018-03-13 DIAGNOSIS — I10 Essential (primary) hypertension: Secondary | ICD-10-CM

## 2018-03-13 NOTE — Telephone Encounter (Signed)
Requested Prescriptions  Pending Prescriptions Disp Refills  . pravastatin (PRAVACHOL) 40 MG tablet [Pharmacy Med Name: PRAVASTATIN SODIUM 40 MG TAB] 90 tablet 1    Sig: TAKE 1 TABLET BY MOUTH EVERY DAY     Cardiovascular:  Antilipid - Statins Failed - 03/13/2018  1:54 AM      Failed - HDL in normal range and within 360 days    HDL  Date Value Ref Range Status  09/14/2017 29 (L) >39 mg/dL Final         Failed - Triglycerides in normal range and within 360 days    Triglycerides  Date Value Ref Range Status  09/14/2017 221 (H) 0 - 149 mg/dL Final         Passed - Total Cholesterol in normal range and within 360 days    Cholesterol, Total  Date Value Ref Range Status  09/14/2017 161 100 - 199 mg/dL Final         Passed - LDL in normal range and within 360 days    LDL Calculated  Date Value Ref Range Status  09/14/2017 88 0 - 99 mg/dL Final         Passed - Patient is not pregnant      Passed - Valid encounter within last 12 months    Recent Outpatient Visits          3 months ago Welcome to [employer] preventive visit   [facility], Ranell Patrick, MD   6 months ago Hyperlipidemia, unspecified hyperlipidemia type   [facility], Ranell Patrick, MD   1 year ago Annual physical exam   [facility], Ranell Patrick, MD   1 year ago Cough   [facility], Ranell Patrick, MD   1 year ago Essential hypertension   [facility], [place] D, PA-C           . lisinopril-hydrochlorothiazide ([name]) 20-25 MG tablet [Pharmacy Med Name: LISINOPRIL-HCTZ 20-25 MG TAB] 90 tablet 1    Sig: TAKE 1 TABLET BY MOUTH EVERY DAY     Cardiovascular:  ACEI + Diuretic Combos Failed - 03/13/2018  1:54 AM      Failed - Last BP in normal range    BP Readings from Last 1 Encounters:  12/13/17 135/90         Passed - Na in normal range and within 180 days    Sodium  Date Value Ref Range Status  09/14/2017 138 134 -  144 mmol/L Final         Passed - K in normal range and within 180 days    Potassium  Date Value Ref Range Status  09/14/2017 4.0 3.5 - 5.2 mmol/L Final         Passed - Cr in normal range and within 180 days    Creat  Date Value Ref Range Status  02/20/2015 1.13 0.70 - 1.25 mg/dL Final   Creatinine, Ser  Date Value Ref Range Status  09/14/2017 1.04 0.76 - 1.27 mg/dL Final         Passed - Ca in normal range and within 180 days    Calcium  Date Value Ref Range Status  09/14/2017 9.5 8.6 - 10.2 mg/dL Final         Passed - Patient is not pregnant      Passed - Valid encounter within last 6 months    Recent Outpatient Visits  3 months ago Welcome to [employer] preventive visit   [facility] [place], MD   6 months ago Hyperlipidemia, unspecified hyperlipidemia type   [facility], Ranell Patrick, MD   1 year ago Annual physical exam   [facility], Ranell Patrick, MD   1 year ago Cough   [facility], Ranell Patrick, MD   1 year ago Essential hypertension   [facility], Reather Laurence, [place]

## 2018-08-31 DIAGNOSIS — L814 Other melanin hyperpigmentation: Secondary | ICD-10-CM | POA: Diagnosis not present

## 2018-08-31 DIAGNOSIS — D1801 Hemangioma of skin and subcutaneous tissue: Secondary | ICD-10-CM | POA: Diagnosis not present

## 2018-08-31 DIAGNOSIS — L218 Other seborrheic dermatitis: Secondary | ICD-10-CM | POA: Diagnosis not present

## 2018-08-31 DIAGNOSIS — L570 Actinic keratosis: Secondary | ICD-10-CM | POA: Diagnosis not present

## 2018-09-16 ENCOUNTER — Other Ambulatory Visit: Payer: Self-pay | Admitting: Family Medicine

## 2018-09-16 DIAGNOSIS — I10 Essential (primary) hypertension: Secondary | ICD-10-CM

## 2018-09-16 DIAGNOSIS — E785 Hyperlipidemia, unspecified: Secondary | ICD-10-CM

## 2018-10-03 ENCOUNTER — Other Ambulatory Visit: Payer: Self-pay

## 2018-10-03 DIAGNOSIS — R6889 Other general symptoms and signs: Secondary | ICD-10-CM | POA: Diagnosis not present

## 2018-10-03 DIAGNOSIS — Z20822 Contact with and (suspected) exposure to covid-19: Secondary | ICD-10-CM

## 2018-10-04 LAB — NOVEL CORONAVIRUS, NAA: SARS-CoV-2, NAA: NOT DETECTED

## 2018-10-05 ENCOUNTER — Telehealth: Payer: Self-pay | Admitting: Family Medicine

## 2018-10-05 NOTE — Telephone Encounter (Signed)
Pt aware covid lab test negative, not detected °

## 2018-10-13 ENCOUNTER — Other Ambulatory Visit: Payer: Self-pay | Admitting: Family Medicine

## 2018-10-13 DIAGNOSIS — I10 Essential (primary) hypertension: Secondary | ICD-10-CM

## 2018-10-15 ENCOUNTER — Other Ambulatory Visit: Payer: Self-pay | Admitting: Family Medicine

## 2018-10-15 DIAGNOSIS — I10 Essential (primary) hypertension: Secondary | ICD-10-CM

## 2018-10-15 NOTE — Telephone Encounter (Signed)
Left a vm for patient to call and schedule appointment

## 2018-10-17 ENCOUNTER — Other Ambulatory Visit: Payer: Self-pay

## 2018-10-17 ENCOUNTER — Encounter: Payer: Self-pay | Admitting: Family Medicine

## 2018-10-17 ENCOUNTER — Ambulatory Visit (INDEPENDENT_AMBULATORY_CARE_PROVIDER_SITE_OTHER): Payer: BC Managed Care – PPO | Admitting: Family Medicine

## 2018-10-17 ENCOUNTER — Ambulatory Visit (INDEPENDENT_AMBULATORY_CARE_PROVIDER_SITE_OTHER): Payer: BC Managed Care – PPO

## 2018-10-17 VITALS — BP 124/77 | HR 82 | Temp 98.6°F | Resp 14 | Wt 249.6 lb

## 2018-10-17 DIAGNOSIS — I10 Essential (primary) hypertension: Secondary | ICD-10-CM | POA: Diagnosis not present

## 2018-10-17 DIAGNOSIS — R05 Cough: Secondary | ICD-10-CM | POA: Diagnosis not present

## 2018-10-17 DIAGNOSIS — J45901 Unspecified asthma with (acute) exacerbation: Secondary | ICD-10-CM | POA: Diagnosis not present

## 2018-10-17 DIAGNOSIS — E785 Hyperlipidemia, unspecified: Secondary | ICD-10-CM

## 2018-10-17 DIAGNOSIS — R0989 Other specified symptoms and signs involving the circulatory and respiratory systems: Secondary | ICD-10-CM

## 2018-10-17 DIAGNOSIS — Z23 Encounter for immunization: Secondary | ICD-10-CM

## 2018-10-17 MED ORDER — FLOVENT HFA 110 MCG/ACT IN AERO: 1.0000 | INHALATION_SPRAY | Freq: Two times a day (BID) | RESPIRATORY_TRACT | 5 refills | Status: DC

## 2018-10-17 MED ORDER — PRAVASTATIN SODIUM 40 MG PO TABS: 40.0000 mg | ORAL_TABLET | Freq: Every day | ORAL | 1 refills | Status: DC

## 2018-10-17 MED ORDER — LISINOPRIL-HYDROCHLOROTHIAZIDE 20-25 MG PO TABS: 1.0000 | ORAL_TABLET | Freq: Every day | ORAL | 1 refills | Status: DC

## 2018-10-17 MED ORDER — PREDNISONE 20 MG PO TABS: 40.0000 mg | ORAL_TABLET | Freq: Every day | ORAL | 0 refills | Status: DC

## 2018-10-17 MED ORDER — ALBUTEROL SULFATE HFA 108 (90 BASE) MCG/ACT IN AERS: 1.0000 | INHALATION_SPRAY | RESPIRATORY_TRACT | 0 refills | Status: DC | PRN

## 2018-10-17 NOTE — Patient Instructions (Addendum)
  Prednisone 40mg  per day for 3 days, then start flovent for asthma 1 puff twice per day. Stop primatene mist. proair if needed.   recheck in 2 weeks. Return to the clinic or go to the nearest emergency room if any of your symptoms worsen or new symptoms occur.  No other med changes for now.    If you have lab work done today you will be contacted with your lab results within the next 2 weeks.  If you have not heard from [place] then please contact us. The fastest way to get your results is to register for My Chart.   IF you received an x-ray today, you will receive an invoice from [facility]. Please contact [facility] at [phone] with questions or concerns regarding your invoice.   IF you received labwork today, you will receive an invoice from [place]. Please contact [facility] at [phone] with questions or concerns regarding your invoice.   Our billing staff will not be able to assist you with questions regarding bills from these companies.  You will be contacted with the lab results as soon as they are available. The fastest way to get your results is to activate your My Chart account. Instructions are located on the last page of this paperwork. If you have not heard from [place] regarding the results in 2 weeks, please contact this office.

## 2018-10-17 NOTE — Progress Notes (Signed)
Subjective:    Patient ID: Joshua Levine, male    DOB: Dec 03, 1950, 68 y.o.   MRN: [id number]  HPI Joshua Levine is a 68 y.o. male Presents today for: Chief Complaint  Patient presents with  . chronic medical condition    med refill- proair, lisinoprl,pravastatin   Hypertension: BP Readings from Last 3 Encounters:  10/17/18 124/77  12/13/17 135/90  09/14/17 130/80   Lab Results  Component Value Date   CREATININE 1.04 09/14/2017  zestoretic 20/25mg  QD.  Still taking QD.   Hyperlipidemia:  Lab Results  Component Value Date   CHOL 161 09/14/2017   HDL 29 (L) 09/14/2017   LDLCALC 88 09/14/2017   TRIG 221 (H) 09/14/2017   CHOLHDL 5.6 (H) 09/14/2017   Lab Results  Component Value Date   ALT 42 09/14/2017   AST 29 09/14/2017   ALKPHOS 78 09/14/2017   BILITOT 0.4 09/14/2017  pravachol 40mg  qd.  No new myalgias, side effects.   Erectile dysfunction: viagra 50mg  dosing used in past.  Only occasional use. Has some left.  No hearing change Joshua blue vision. No chest pain with intercourse/exertion.   Allergic Rhinitis: flonase used in past. Using flonase 1spr BID. Still some nasal congestion at times.  Concern for more asthma symptoms. Wheezing more recently. Feels like daily since illness 3 weeks ago, covid test negative on 10/04/18.  Some chest congestion.  Feels tight, no chest pain. Minor body aches, low grade fever initially - under 100- body aches and fever improved - still with congestion.  No nocturnal dyspnea.  No orthopnea No heartburn Some postnasal drip as above.  proair rx by allergist years ago - expired - using about every other day. primatene mist 5 -6 times per day. Feels some temporary relief.   Patient Active Problem List   Diagnosis Date Noted  . HTN (hypertension) 12/10/2011  . Hyperlipidemia 12/10/2011  . Erectile dysfunction 12/10/2011   Past Medical History:  Diagnosis Date  . Allergy   . Arthritis   . Basal cell carcinoma   . Erectile  dysfunction   . Hyperlipidemia   . Hypertension    Past Surgical History:  Procedure Laterality Date  . LUMBAR DISC SURGERY    . SPINE SURGERY     No Known Allergies Prior to Admission medications   Medication Sig Start Date End Date Taking? Authorizing Provider  Ascorbic Acid (VITAMIN C) 100 MG tablet Take 100 mg by mouth daily.   Yes [provider]  fluticasone (FLONASE) 50 MCG/ACT nasal spray Place 2 sprays into both nostrils daily. 02/20/15  Yes Posey Boyer, MD  glucosamine-chondroitin 500-400 MG tablet Take 1 tablet by mouth 3 (three) times daily.   Yes [provider]  lisinopril-hydrochlorothiazide (ZESTORETIC) 20-25 MG tablet TAKE 1 TABLET BY MOUTH EVERY DAY 09/17/18  Yes Wendie Agreste, MD  niacin (NIASPAN) 1000 MG CR tablet TAKE 1 TABLET (1,000 MG TOTAL) BY MOUTH AT BEDTIME  "NO MORE REFILLS WITHOUT OFFICE VISIT" 12/30/14  Yes Jacqulynn Cadet, Domingo Mend, PA  pravastatin (PRAVACHOL) 40 MG tablet TAKE 1 TABLET BY MOUTH EVERY DAY 09/17/18  Yes Wendie Agreste, MD  sildenafil (VIAGRA) 100 MG tablet Take 0.5-1 tablets (50-100 mg total) by mouth daily as needed for erectile dysfunction. 09/14/17 10/22/18 Yes Wendie Agreste, MD   Social History   Socioeconomic History  . Marital status: Married    Spouse name: Not on file  . Number of children: Not on file  . Years of education: Not  on file  . Highest education level: Not on file  Occupational History  . Occupation: Public librarian: [employer]  Social Needs  . Financial resource strain: Not on file  . Food insecurity    Worry: Not on file    Inability: Not on file  . Transportation needs    Medical: Not on file    Non-medical: Not on file  Tobacco Use  . Smoking status: Former Research scientist (life sciences)  . Smokeless tobacco: Never Used  Substance and Sexual Activity  . Alcohol use: No  . Drug use: No  . Sexual activity: Yes  Lifestyle  . Physical activity    Days per week: Not on file    Minutes per session: Not on  file  . Stress: Not on file  Relationships  . Social Herbalist on phone: Not on file    Gets together: Not on file    Attends religious service: Not on file    Active member of club Joshua organization: Not on file    Attends meetings of clubs Joshua organizations: Not on file    Relationship status: Not on file  . Intimate partner violence    Fear of current Joshua ex partner: Not on file    Emotionally abused: Not on file    Physically abused: Not on file    Forced sexual activity: Not on file  Other Topics Concern  . Not on file  Social History Narrative   Married. Education: [employer]. Exercise: Walk 2 times a week for 45 minutes.    Review of Systems Per HPI.     Objective:   Physical Exam Vitals signs reviewed.  Constitutional:      Appearance: He is well-developed.  HENT:     Head: Normocephalic and atraumatic.  Eyes:     Pupils: Pupils are equal, round, and reactive to light.  Neck:     Vascular: No carotid bruit Joshua JVD.  Cardiovascular:     Rate and Rhythm: Normal rate and regular rhythm.     Heart sounds: Normal heart sounds. No murmur. No gallop.   Pulmonary:     Effort: Pulmonary effort is normal.     Breath sounds: Normal breath sounds. No rales.  Musculoskeletal:     Right lower leg: No edema.     Left lower leg: No edema.  Skin:    General: Skin is warm and dry.  Neurological:     Mental Status: He is alert and oriented to person, place, and time.    Vitals:   10/17/18 1020  BP: 124/77  Pulse: 82  Resp: 14  Temp: 98.6 F (37 C)  TempSrc: Oral  SpO2: 96%  Weight: 249 lb 9.6 oz (113.2 kg)   Dg Chest 2 View  Result Date: 10/17/2018 CLINICAL DATA:  68 year old male with chest congestion and cough. EXAM: CHEST - 2 VIEW COMPARISON:  Chest radiograph dated 03/03/2008 FINDINGS: The heart size and mediastinal contours are within normal limits. Both lungs are clear. The visualized skeletal structures are unremarkable. IMPRESSION: No active  cardiopulmonary disease. Electronically Signed   By: Anner Crete M.D.   On: 10/17/2018 11:31       Assessment & Plan:   Joshua Levine is a 68 y.o. male Asthma with acute exacerbation, unspecified asthma severity, unspecified whether persistent - Plan: DG Chest 2 View, predniSONE (DELTASONE) 20 MG tablet, albuterol (VENTOLIN HFA) 108 (90 Base) MCG/ACT inhaler Chest congestion - Plan: DG Chest 2 View  -  Stop Primatene Mist.  Start prednisone, short course, potential side effects and risk discussed.  Then start Flovent inhaler daily for improved control.  Albuterol inhaler provided if needed for breakthrough symptoms.  Recheck 2 weeks  Hyperlipidemia, unspecified hyperlipidemia type - Plan: Comprehensive metabolic panel, pravastatin (PRAVACHOL) 40 MG tablet, Lipid panel  -  Stable, tolerating current regimen. Medications refilled. Labs pending as above.   Essential hypertension - Plan: Comprehensive metabolic panel, lisinopril-hydrochlorothiazide (ZESTORETIC) 20-25 MG tablet  -  Stable, tolerating current regimen. Medications refilled. Labs pending as above.   Need for prophylactic vaccination against Streptococcus pneumoniae (pneumococcus) - Plan: Pneumococcal polysaccharide vaccine 23-valent greater than Joshua equal to 67yo subcutaneous/IM  Need for prophylactic vaccination and inoculation against influenza - Plan: Flu Vaccine QUAD High Dose(Fluad)   Meds ordered this encounter  Medications  . pravastatin (PRAVACHOL) 40 MG tablet    Sig: Take 1 tablet (40 mg total) by mouth daily.    Dispense:  90 tablet    Refill:  1  . lisinopril-hydrochlorothiazide (ZESTORETIC) 20-25 MG tablet    Sig: Take 1 tablet by mouth daily.    Dispense:  90 tablet    Refill:  1  . predniSONE (DELTASONE) 20 MG tablet    Sig: Take 2 tablets (40 mg total) by mouth daily with breakfast.    Dispense:  10 tablet    Refill:  0  . albuterol (VENTOLIN HFA) 108 (90 Base) MCG/ACT inhaler    Sig: Inhale 1-2 puffs  into the lungs every 4 (four) hours as needed for wheezing Joshua shortness of breath.    Dispense:  18 g    Refill:  0   Patient Instructions    Prednisone 40mg  per day for 3 days, then start flovent for asthma 1 puff twice per day. Stop primatene mist. proair if needed.   recheck in 2 weeks. Return to the clinic Joshua go to the nearest emergency room if any of your symptoms worsen Joshua new symptoms occur.  No other med changes for now.    If you have lab work done today you will be contacted with your lab results within the next 2 weeks.  If you have not heard from [place] then please contact us. The fastest way to get your results is to register for My Chart.   IF you received an x-ray today, you will receive an invoice from [facility]. Please contact [facility] at [phone] with questions Joshua concerns regarding your invoice.   IF you received labwork today, you will receive an invoice from [place]. Please contact [facility] at [phone] with questions Joshua concerns regarding your invoice.   Our billing staff will not be able to assist you with questions regarding bills from these companies.  You will be contacted with the lab results as soon as they are available. The fastest way to get your results is to activate your My Chart account. Instructions are located on the last page of this paperwork. If you have not heard from [place] regarding the results in 2 weeks, please contact this office.       Signed,   [name], MD [facility] [place].  10/17/18 7:33 PM

## 2018-10-18 LAB — COMPREHENSIVE METABOLIC PANEL
ALT: 43 IU/L (ref 0–44)
AST: 30 IU/L (ref 0–40)
Albumin/Globulin Ratio: 2.1 (ref 1.2–2.2)
Albumin: 4.9 g/dL — ABNORMAL HIGH (ref 3.8–4.8)
Alkaline Phosphatase: 79 IU/L (ref 39–117)
BUN/Creatinine Ratio: 18 (ref 10–24)
BUN: 19 mg/dL (ref 8–27)
Bilirubin Total: 0.4 mg/dL (ref 0.0–1.2)
CO2: 21 mmol/L (ref 20–29)
Calcium: 9.9 mg/dL (ref 8.6–10.2)
Chloride: 99 mmol/L (ref 96–106)
Creatinine, Ser: 1.04 mg/dL (ref 0.76–1.27)
GFR calc Af Amer: 86 mL/min/{1.73_m2} (ref >59)
GFR calc non Af Amer: 74 mL/min/{1.73_m2} (ref >59)
Globulin, Total: 2.3 g/dL (ref 1.5–4.5)
Glucose: 100 mg/dL — ABNORMAL HIGH (ref 65–99)
Potassium: 4.6 mmol/L (ref 3.5–5.2)
Sodium: 137 mmol/L (ref 134–144)
Total Protein: 7.2 g/dL (ref 6.0–8.5)

## 2018-10-18 LAB — LIPID PANEL
Chol/HDL Ratio: 6.8 ratio — ABNORMAL HIGH (ref 0.0–5.0)
Cholesterol, Total: 196 mg/dL (ref 100–199)
HDL: 29 mg/dL — ABNORMAL LOW (ref >39)
LDL Chol Calc (NIH): 100 mg/dL — ABNORMAL HIGH (ref 0–99)
Triglycerides: 394 mg/dL — ABNORMAL HIGH (ref 0–149)
VLDL Cholesterol Cal: 67 mg/dL — ABNORMAL HIGH (ref 5–40)

## 2018-10-29 ENCOUNTER — Other Ambulatory Visit: Payer: Self-pay

## 2018-10-29 ENCOUNTER — Encounter: Payer: Self-pay | Admitting: Family Medicine

## 2018-10-29 ENCOUNTER — Ambulatory Visit (INDEPENDENT_AMBULATORY_CARE_PROVIDER_SITE_OTHER): Payer: BC Managed Care – PPO | Admitting: Family Medicine

## 2018-10-29 VITALS — BP 122/73 | HR 91 | Temp 98.9°F | Wt 249.2 lb

## 2018-10-29 DIAGNOSIS — J4530 Mild persistent asthma, uncomplicated: Secondary | ICD-10-CM | POA: Diagnosis not present

## 2018-10-29 NOTE — Progress Notes (Signed)
Subjective:    Patient ID: Joshua Levine, male    DOB: September 01, 1950, 68 y.o.   MRN: [id number]  HPI Joshua Levine is a 68 y.o. male Presents today for: Chief Complaint  Patient presents with  . Asthma     2 wk f/u on asthma. Patient stated his asthma is doing much better, still have a little congestion but over all doing better   Asthma: Follow-up from September 16 visit.  Thought to be an asthma flare at that time, but only  had been using Primatene Mist.  Started on prednisone, short course, followed by Flovent 171mcg BID, with albuterol as needed Pneumovax and flu vaccine given last visit.  Stopped primatene mist.  Feeling much better.  Albuterol once per day - especially in am when congested. Min wheeze, usually none during the day. flovent 1 puff BID- just started in past week.      Patient Active Problem List   Diagnosis Date Noted  . HTN (hypertension) 12/10/2011  . Hyperlipidemia 12/10/2011  . Erectile dysfunction 12/10/2011   Past Medical History:  Diagnosis Date  . Allergy   . Arthritis   . Basal cell carcinoma   . Erectile dysfunction   . Hyperlipidemia   . Hypertension    Past Surgical History:  Procedure Laterality Date  . LUMBAR DISC SURGERY    . SPINE SURGERY     No Known Allergies Prior to Admission medications   Medication Sig Start Date End Date Taking? Authorizing Provider  albuterol (VENTOLIN HFA) 108 (90 Base) MCG/ACT inhaler Inhale 1-2 puffs into the lungs every 4 (four) hours as needed for wheezing or shortness of breath. 10/17/18  Yes Wendie Agreste, MD  Ascorbic Acid (VITAMIN C) 100 MG tablet Take 100 mg by mouth daily.   Yes [provider]  fluticasone (FLONASE) 50 MCG/ACT nasal spray Place 2 sprays into both nostrils daily. 02/20/15  Yes Posey Boyer, MD  fluticasone (FLOVENT HFA) 110 MCG/ACT inhaler Inhale 1 puff into the lungs 2 (two) times daily. 10/17/18  Yes Wendie Agreste, MD  glucosamine-chondroitin 500-400 MG  tablet Take 1 tablet by mouth 3 (three) times daily.   Yes [provider]  lisinopril-hydrochlorothiazide (ZESTORETIC) 20-25 MG tablet Take 1 tablet by mouth daily. 10/17/18  Yes Wendie Agreste, MD  niacin (NIASPAN) 1000 MG CR tablet TAKE 1 TABLET (1,000 MG TOTAL) BY MOUTH AT BEDTIME  "NO MORE REFILLS WITHOUT OFFICE VISIT" 12/30/14  Yes Jeffery, Chelle, PA  pravastatin (PRAVACHOL) 40 MG tablet Take 1 tablet (40 mg total) by mouth daily. 10/17/18  Yes Wendie Agreste, MD   Social History   Socioeconomic History  . Marital status: Married    Spouse name: Not on file  . Number of children: Not on file  . Years of education: Not on file  . Highest education level: Not on file  Occupational History  . Occupation: Public librarian: [employer]  Social Needs  . Financial resource strain: Not on file  . Food insecurity    Worry: Not on file    Inability: Not on file  . Transportation needs    Medical: Not on file    Non-medical: Not on file  Tobacco Use  . Smoking status: Former Research scientist (life sciences)  . Smokeless tobacco: Never Used  Substance and Sexual Activity  . Alcohol use: No  . Drug use: No  . Sexual activity: Yes  Lifestyle  . Physical activity    Days per week: Not  on file    Minutes per session: Not on file  . Stress: Not on file  Relationships  . Social Herbalist on phone: Not on file    Gets together: Not on file    Attends religious service: Not on file    Active member of club or organization: Not on file    Attends meetings of clubs or organizations: Not on file    Relationship status: Not on file  . Intimate partner violence    Fear of current or ex partner: Not on file    Emotionally abused: Not on file    Physically abused: Not on file    Forced sexual activity: Not on file  Other Topics Concern  . Not on file  Social History Narrative   Married. Education: [employer]. Exercise: Walk 2 times a week for 45 minutes.     Review of Systems Per  HPI.     Objective:   Physical Exam Vitals signs reviewed.  Constitutional:      Appearance: He is well-developed.  HENT:     Head: Normocephalic and atraumatic.  Eyes:     Pupils: Pupils are equal, round, and reactive to light.  Neck:     Vascular: No carotid bruit or JVD.  Cardiovascular:     Rate and Rhythm: Normal rate and regular rhythm.     Heart sounds: Normal heart sounds. No murmur.  Pulmonary:     Effort: Pulmonary effort is normal.     Breath sounds: Normal breath sounds. No wheezing or rales.     Comments: Lungs clear including with forced expiration with only minimal upper airway sounds at terminal expiration. Skin:    General: Skin is warm and dry.  Neurological:     Mental Status: He is alert and oriented to person, place, and time.    Vitals:   10/29/18 1621  BP: 122/73  Pulse: 91  Temp: 98.9 F (37.2 C)  TempSrc: Oral  SpO2: 96%  Weight: 249 lb 3.2 oz (113 kg)       Assessment & Plan:   Joshua Levine is a 68 y.o. male Mild persistent asthma without complication  -Improved control, continue fluticasone 1 puff twice daily for this week, then if persistent need for albuterol daily, increase to 2 puffs twice daily.  Potential side effects discussed, rinse mouth with water after use to minimize thrush possibility.  Continue albuterol as needed.  Recheck 6 months, RTC precautions sooner if worse.   No orders of the defined types were placed in this encounter.  Patient Instructions    If still requiring albuterol daily in next 1 week, then can increase to 2 puffs twice per day of the fluticasone. If still requiring albuterol frequently - let me know to decide on other changes. Rinse mouth with water after use of flovent.  Thanks for coming in today.  Return to the clinic or go to the nearest emergency room if any of your symptoms worsen or new symptoms occur.   Asthma Attack Prevention, Adult Although you may not be able to control the fact that you  have asthma, you can take actions to prevent episodes of asthma (asthma attacks). These actions include:  Creating a written plan for managing and treating your asthma attacks (asthma action plan).  Monitoring your asthma.  Avoiding things that can irritate your airways or make your asthma symptoms worse (asthma triggers).  Taking your medicines as directed.  Acting quickly if you  have signs or symptoms of an asthma attack. What are some ways to prevent an asthma attack? Create a plan Work with your health care provider to create an asthma action plan. This plan should include:  A list of your asthma triggers and how to avoid them.  A list of symptoms that you experience during an asthma attack.  Information about when to take medicine and how much medicine to take.  Information to help you understand your peak flow measurements.  Contact information for your health care providers.  Daily actions that you can take to control asthma. Monitor your asthma To monitor your asthma:  Use your peak flow meter every morning and every evening for 2-3 weeks. Record the results in a journal. A drop in your peak flow numbers on one or more days may mean that you are starting to have an asthma attack, even if you are not having symptoms.  When you have asthma symptoms, write them down in a journal.  Avoid asthma triggers Work with your health care provider to find out what your asthma triggers are. This can be done by:  Being tested for allergies.  Keeping a journal that notes when asthma attacks occur and what may have contributed to them.  Asking your health care provider whether other medical conditions make your asthma worse. Common asthma triggers include:  Dust.  Smoke. This includes campfire smoke and secondhand smoke from tobacco products.  Pet dander.  Trees, grasses or pollens.  Very cold, dry, or humid air.  Mold.  Foods that contain high amounts of sulfites.   Strong smells.  Engine exhaust and air pollution.  Aerosol sprays and fumes from household cleaners.  Household pests and their droppings, including dust mites and cockroaches.  Certain medicines, including NSAIDs. Once you have determined your asthma triggers, take steps to avoid them. Depending on your triggers, you may be able to reduce the chance of an asthma attack by:  Keeping your home clean. Have someone dust and vacuum your home for you 1 or 2 times a week. If possible, have them use a high-efficiency particulate arrestance (HEPA) vacuum.  Washing your sheets weekly in hot water.  Using allergy-proof mattress covers and casings on your bed.  Keeping pets out of your home.  Taking care of mold and water problems in your home.  Avoiding areas where people smoke.  Avoiding using strong perfumes or odor sprays.  Avoid spending a lot of time outdoors when pollen counts are high and on very windy days.  Talking with your health care provider before stopping or starting any new medicines. Medicines Take over-the-counter and prescription medicines only as told by your health care provider. Many asthma attacks can be prevented by carefully following your medicine schedule. Taking your medicines correctly is especially important when you cannot avoid certain asthma triggers. Even if you are doing well, do not stop taking your medicine and do not take less medicine. Act quickly If an asthma attack happens, acting quickly can decrease how severe it is and how long it lasts. Take these actions:  Pay attention to your symptoms. If you are coughing, wheezing, or having difficulty breathing, do not wait to see if your symptoms go away on their own. Follow your asthma action plan.  If you have followed your asthma action plan and your symptoms are not improving, call your health care provider or seek immediate medical care at the nearest hospital. It is important to write down how often  you need to use your fast-acting rescue inhaler. You can track how often you use an inhaler in your journal. If you are using your rescue inhaler more often, it may mean that your asthma is not under control. Adjusting your asthma treatment plan may help you to prevent future asthma attacks and help you to gain better control of your condition. How can I prevent an asthma attack when I exercise? Exercise is a common asthma trigger. To prevent asthma attacks during exercise:  Follow advice from your health care provider about whether you should use your fast-acting inhaler before exercising. Many people with asthma experience exercise-induced bronchoconstriction (EIB). This condition often worsens during vigorous exercise in cold, humid, or dry environments. Usually, people with EIB can stay very active by using a fast-acting inhaler before exercising.  Avoid exercising outdoors in very cold or humid weather.  Avoid exercising outdoors when pollen counts are high.  Warm up and cool down when exercising.  Stop exercising right away if asthma symptoms start. Consider taking part in exercises that are less likely to cause asthma symptoms such as:  Indoor swimming.  Biking.  Walking.  Hiking.  Playing football. This information is not intended to replace advice given to you by your health care provider. Make sure you discuss any questions you have with your health care provider. Document Released: 01/05/2009 Document Revised: 12/30/2016 Document Reviewed: 07/04/2015 Elsevier Patient Education  [employer].    If you have lab work done today you will be contacted with your lab results within the next 2 weeks.  If you have not heard from [place] then please contact us. The fastest way to get your results is to register for My Chart.   IF you received an x-ray today, you will receive an invoice from [facility]. Please contact [facility] at [phone] with questions or  concerns regarding your invoice.   IF you received labwork today, you will receive an invoice from [facility]. Please contact [facility] at [phone] with questions or concerns regarding your invoice.   Our billing staff will not be able to assist you with questions regarding bills from these companies.  You will be contacted with the lab results as soon as they are available. The fastest way to get your results is to activate your My Chart account. Instructions are located on the last page of this paperwork. If you have not heard from [place] regarding the results in 2 weeks, please contact this office.       Signed,   [name], MD [facility] [place].  10/29/18 4:50 PM

## 2018-10-29 NOTE — Patient Instructions (Addendum)
If still requiring albuterol daily in next 1 week, then can increase to 2 puffs twice per day of the fluticasone. If still requiring albuterol frequently - let me know to decide on other changes. Rinse mouth with water after use of flovent.  Thanks for coming in today.  Return to the clinic or go to the nearest emergency room if any of your symptoms worsen or new symptoms occur.   Asthma Attack Prevention, Adult Although you may not be able to control the fact that you have asthma, you can take actions to prevent episodes of asthma (asthma attacks). These actions include:  Creating a written plan for managing and treating your asthma attacks (asthma action plan).  Monitoring your asthma.  Avoiding things that can irritate your airways or make your asthma symptoms worse (asthma triggers).  Taking your medicines as directed.  Acting quickly if you have signs or symptoms of an asthma attack. What are some ways to prevent an asthma attack? Create a plan Work with your health care provider to create an asthma action plan. This plan should include:  A list of your asthma triggers and how to avoid them.  A list of symptoms that you experience during an asthma attack.  Information about when to take medicine and how much medicine to take.  Information to help you understand your peak flow measurements.  Contact information for your health care providers.  Daily actions that you can take to control asthma. Monitor your asthma To monitor your asthma:  Use your peak flow meter every morning and every evening for 2-3 weeks. Record the results in a journal. A drop in your peak flow numbers on one or more days may mean that you are starting to have an asthma attack, even if you are not having symptoms.  When you have asthma symptoms, write them down in a journal.  Avoid asthma triggers Work with your health care provider to find out what your asthma triggers are. This can be done  by:  Being tested for allergies.  Keeping a journal that notes when asthma attacks occur and what may have contributed to them.  Asking your health care provider whether other medical conditions make your asthma worse. Common asthma triggers include:  Dust.  Smoke. This includes campfire smoke and secondhand smoke from tobacco products.  Pet dander.  Trees, grasses or pollens.  Very cold, dry, or humid air.  Mold.  Foods that contain high amounts of sulfites.  Strong smells.  Engine exhaust and air pollution.  Aerosol sprays and fumes from household cleaners.  Household pests and their droppings, including dust mites and cockroaches.  Certain medicines, including NSAIDs. Once you have determined your asthma triggers, take steps to avoid them. Depending on your triggers, you may be able to reduce the chance of an asthma attack by:  Keeping your home clean. Have someone dust and vacuum your home for you 1 or 2 times a week. If possible, have them use a high-efficiency particulate arrestance (HEPA) vacuum.  Washing your sheets weekly in hot water.  Using allergy-proof mattress covers and casings on your bed.  Keeping pets out of your home.  Taking care of mold and water problems in your home.  Avoiding areas where people smoke.  Avoiding using strong perfumes or odor sprays.  Avoid spending a lot of time outdoors when pollen counts are high and on very windy days.  Talking with your health care provider before stopping or starting any new medicines. Medicines  Take over-the-counter and prescription medicines only as told by your health care provider. Many asthma attacks can be prevented by carefully following your medicine schedule. Taking your medicines correctly is especially important when you cannot avoid certain asthma triggers. Even if you are doing well, do not stop taking your medicine and do not take less medicine. Act quickly If an asthma attack happens,  acting quickly can decrease how severe it is and how long it lasts. Take these actions:  Pay attention to your symptoms. If you are coughing, wheezing, or having difficulty breathing, do not wait to see if your symptoms go away on their own. Follow your asthma action plan.  If you have followed your asthma action plan and your symptoms are not improving, call your health care provider or seek immediate medical care at the nearest hospital. It is important to write down how often you need to use your fast-acting rescue inhaler. You can track how often you use an inhaler in your journal. If you are using your rescue inhaler more often, it may mean that your asthma is not under control. Adjusting your asthma treatment plan may help you to prevent future asthma attacks and help you to gain better control of your condition. How can I prevent an asthma attack when I exercise? Exercise is a common asthma trigger. To prevent asthma attacks during exercise:  Follow advice from your health care provider about whether you should use your fast-acting inhaler before exercising. Many people with asthma experience exercise-induced bronchoconstriction (EIB). This condition often worsens during vigorous exercise in cold, humid, or dry environments. Usually, people with EIB can stay very active by using a fast-acting inhaler before exercising.  Avoid exercising outdoors in very cold or humid weather.  Avoid exercising outdoors when pollen counts are high.  Warm up and cool down when exercising.  Stop exercising right away if asthma symptoms start. Consider taking part in exercises that are less likely to cause asthma symptoms such as:  Indoor swimming.  Biking.  Walking.  Hiking.  Playing football. This information is not intended to replace advice given to you by your health care provider. Make sure you discuss any questions you have with your health care provider. Document Released: 01/05/2009 Document  Revised: 12/30/2016 Document Reviewed: 07/04/2015 Elsevier Patient Education  [employer].    If you have lab work done today you will be contacted with your lab results within the next 2 weeks.  If you have not heard from [place] then please contact us. The fastest way to get your results is to register for My Chart.   IF you received an x-ray today, you will receive an invoice from [facility]. Please contact [facility] at [phone] with questions or concerns regarding your invoice.   IF you received labwork today, you will receive an invoice from [facility]. Please contact [facility] at [phone] with questions or concerns regarding your invoice.   Our billing staff will not be able to assist you with questions regarding bills from these companies.  You will be contacted with the lab results as soon as they are available. The fastest way to get your results is to activate your My Chart account. Instructions are located on the last page of this paperwork. If you have not heard from [place] regarding the results in 2 weeks, please contact this office.

## 2018-10-31 ENCOUNTER — Other Ambulatory Visit: Payer: Self-pay | Admitting: Family Medicine

## 2018-10-31 DIAGNOSIS — J45901 Unspecified asthma with (acute) exacerbation: Secondary | ICD-10-CM

## 2018-10-31 NOTE — Telephone Encounter (Signed)
Requested medication (s) are due for refill today: no  Requested medication (s) are on the active medication list: yes  Last refill:  10/17/2018  Future visit scheduled: yes  Notes to clinic: One inhaler should last at least one month. If the patient is requesting refills earlier,    Requested Prescriptions  Pending Prescriptions Disp Refills   albuterol (VENTOLIN HFA) 108 (90 Base) MCG/ACT inhaler [Pharmacy Med Name: ALBUTEROL HFA (VENTOLIN) INH]  0    Sig: Inhale 1-2 puffs into the lungs every 4 (four) hours as needed for wheezing or shortness of breath.     Pulmonology:  Beta Agonists Failed - 10/31/2018  1:38 AM      Failed - One inhaler should last at least one month. If the patient is requesting refills earlier, contact the patient to check for uncontrolled symptoms.      Passed - Valid encounter within last 12 months    Recent Outpatient Visits          2 days ago Mild persistent asthma without complication   [facility], Ranell Patrick, MD   2 weeks ago Asthma with acute exacerbation, unspecified asthma severity, unspecified whether persistent   [facility] [place], Ranell Patrick, MD   10 months ago Welcome to [facility] preventive visit   [facility] [place], Ranell Patrick, MD   1 year ago Hyperlipidemia, unspecified hyperlipidemia type   [facility], Ranell Patrick, MD   2 years ago Annual physical exam   [facility] [place], Ranell Patrick, MD      Future Appointments            In 6 months Carlota Raspberry Ranell Patrick, MD [facility] [place], [facility]

## 2018-12-17 ENCOUNTER — Telehealth: Payer: Self-pay | Admitting: Family Medicine

## 2018-12-17 NOTE — Telephone Encounter (Signed)
LVM to r/s appt on 04/30/2019 with dr. Carlota Raspberry. Provider will be out of the office on that day

## 2019-02-09 ENCOUNTER — Other Ambulatory Visit: Payer: Self-pay | Admitting: Family Medicine

## 2019-02-09 DIAGNOSIS — J45901 Unspecified asthma with (acute) exacerbation: Secondary | ICD-10-CM

## 2019-02-09 NOTE — Telephone Encounter (Signed)
Requested medication (s) are due for refill today: yes  Requested medication (s) are on the active medication list: yes  Last refill:  10/17/2018  Future visit scheduled: no  Notes to clinic:  One inhaler should last at least one month. If the patient is requesting refills earlier, contact the patient to check for uncontrolled symptoms   Requested Prescriptions  Pending Prescriptions Disp Refills   albuterol (VENTOLIN HFA) 108 (90 Base) MCG/ACT inhaler [Pharmacy Med Name: ALBUTEROL HFA (VENTOLIN) INH]      Sig: Inhale 1-2 puffs into the lungs every 4 (four) hours as needed for wheezing or shortness of breath.      Pulmonology:  Beta Agonists Failed - 02/09/2019 11:27 AM      Failed - One inhaler should last at least one month. If the patient is requesting refills earlier, contact the patient to check for uncontrolled symptoms.      Passed - Valid encounter within last 12 months    Recent Outpatient Visits           3 months ago Mild persistent asthma without complication   [facility], Ranell Patrick, MD   3 months ago Asthma with acute exacerbation, unspecified asthma severity, unspecified whether persistent   [facility], Ranell Patrick, MD   1 year ago [facility] to [facility] preventive visit   [facility], Ranell Patrick, MD   1 year ago Hyperlipidemia, unspecified hyperlipidemia type   [facility], Ranell Patrick, MD   2 years ago Annual physical exam   [facility], Ranell Patrick, MD

## 2019-02-22 DIAGNOSIS — D225 Melanocytic nevi of trunk: Secondary | ICD-10-CM | POA: Diagnosis not present

## 2019-02-22 DIAGNOSIS — L570 Actinic keratosis: Secondary | ICD-10-CM | POA: Diagnosis not present

## 2019-02-22 DIAGNOSIS — L821 Other seborrheic keratosis: Secondary | ICD-10-CM | POA: Diagnosis not present

## 2019-02-22 DIAGNOSIS — L905 Scar conditions and fibrosis of skin: Secondary | ICD-10-CM | POA: Diagnosis not present

## 2019-02-22 DIAGNOSIS — D1801 Hemangioma of skin and subcutaneous tissue: Secondary | ICD-10-CM | POA: Diagnosis not present

## 2019-03-02 ENCOUNTER — Encounter: Payer: Self-pay | Admitting: Family Medicine

## 2019-03-23 ENCOUNTER — Other Ambulatory Visit: Payer: Self-pay | Admitting: Family Medicine

## 2019-03-23 DIAGNOSIS — J45901 Unspecified asthma with (acute) exacerbation: Secondary | ICD-10-CM

## 2019-03-23 NOTE — Telephone Encounter (Signed)
Requested Prescriptions  Pending Prescriptions Disp Refills  . albuterol (VENTOLIN HFA) 108 (90 Base) MCG/ACT inhaler [Pharmacy Med Name: ALBUTEROL HFA (VENTOLIN) INH]  1    Sig: INHALE 1-2 PUFFS INTO THE LUNGS EVERY 4 (FOUR) HOURS AS NEEDED FOR WHEEZING OR SHORTNESS OF BREATH.     Pulmonology:  Beta Agonists Failed - 03/23/2019  9:20 AM      Failed - One inhaler should last at least one month. If the patient is requesting refills earlier, contact the patient to check for uncontrolled symptoms.      Passed - Valid encounter within last 12 months    Recent Outpatient Visits          4 months ago Mild persistent asthma without complication   [facility], Ranell Patrick, MD   5 months ago Asthma with acute exacerbation, unspecified asthma severity, unspecified whether persistent   [facility], Ranell Patrick, MD   1 year ago [facility] to [facility] preventive visit   [facility] [place], Ranell Patrick, MD   1 year ago Hyperlipidemia, unspecified hyperlipidemia type   [facility], Ranell Patrick, MD   2 years ago Annual physical exam   [facility], Ranell Patrick, MD      Future Appointments            In 1 month Carlota Raspberry Ranell Patrick, MD [facility] [place], [facility]

## 2019-04-30 ENCOUNTER — Ambulatory Visit: Payer: Medicare Other | Admitting: Family Medicine

## 2019-04-30 ENCOUNTER — Other Ambulatory Visit: Payer: Self-pay | Admitting: Family Medicine

## 2019-04-30 DIAGNOSIS — I10 Essential (primary) hypertension: Secondary | ICD-10-CM

## 2019-04-30 DIAGNOSIS — E785 Hyperlipidemia, unspecified: Secondary | ICD-10-CM

## 2019-04-30 NOTE — Telephone Encounter (Signed)
Requested Prescriptions  Pending Prescriptions Disp Refills  . pravastatin (PRAVACHOL) 40 MG tablet [Pharmacy Med Name: PRAVASTATIN SODIUM 40 MG TAB] 90 tablet 1    Sig: TAKE 1 TABLET BY MOUTH EVERY DAY     Cardiovascular:  Antilipid - Statins Failed - 04/30/2019  1:39 AM      Failed - LDL in normal range and within 360 days    LDL Chol Calc (NIH)  Date Value Ref Range Status  10/17/2018 100 (H) 0 - 99 mg/dL Final         Failed - HDL in normal range and within 360 days    HDL  Date Value Ref Range Status  10/17/2018 29 (L) >39 mg/dL Final         Failed - Triglycerides in normal range and within 360 days    Triglycerides  Date Value Ref Range Status  10/17/2018 394 (H) 0 - 149 mg/dL Final         Passed - Total Cholesterol in normal range and within 360 days    Cholesterol, Total  Date Value Ref Range Status  10/17/2018 196 100 - 199 mg/dL Final         Passed - Patient is not pregnant      Passed - Valid encounter within last 12 months    Recent Outpatient Visits          6 months ago Mild persistent asthma without complication   [facility], Ranell Patrick, MD   6 months ago Asthma with acute exacerbation, unspecified asthma severity, unspecified whether persistent   [facility], Ranell Patrick, MD   1 year ago Welcome to [facility] preventive visit   [facility], Ranell Patrick, MD   1 year ago Hyperlipidemia, unspecified hyperlipidemia type   [facility], Ranell Patrick, MD   2 years ago Annual physical exam   [facility], Ranell Patrick, MD      Future Appointments            In 6 days Wendie Agreste, MD [facility] [place], [facility]           . lisinopril-hydrochlorothiazide (ZESTORETIC) 20-25 MG tablet [Pharmacy Med Name: LISINOPRIL-HCTZ 20-25 MG TAB] 90 tablet 1    Sig: TAKE 1 TABLET BY MOUTH EVERY DAY     Cardiovascular:  ACEI + Diuretic Combos Failed - 04/30/2019  1:39 AM      Failed  - Na in normal range and within 180 days    Sodium  Date Value Ref Range Status  10/17/2018 137 134 - 144 mmol/L Final         Failed - K in normal range and within 180 days    Potassium  Date Value Ref Range Status  10/17/2018 4.6 3.5 - 5.2 mmol/L Final         Failed - Cr in normal range and within 180 days    Creat  Date Value Ref Range Status  02/20/2015 1.13 0.70 - 1.25 mg/dL Final   Creatinine, Ser  Date Value Ref Range Status  10/17/2018 1.04 0.76 - 1.27 mg/dL Final         Failed - Ca in normal range and within 180 days    Calcium  Date Value Ref Range Status  10/17/2018 9.9 8.6 - 10.2 mg/dL Final         Failed - Valid encounter within last 6 months  Recent Outpatient Visits          6 months ago Mild persistent asthma without complication   [facility], Ranell Patrick, MD   6 months ago Asthma with acute exacerbation, unspecified asthma severity, unspecified whether persistent   [facility], Ranell Patrick, MD   1 year ago Welcome to [facility] preventive visit   [facility], Ranell Patrick, MD   1 year ago Hyperlipidemia, unspecified hyperlipidemia type   [facility], Ranell Patrick, MD   2 years ago Annual physical exam   [facility] [place], MD      Future Appointments            In 6 days Wendie Agreste, MD [facility], [place] - Patient is not pregnant      Passed - Last BP in normal range    BP Readings from Last 1 Encounters:  10/29/18 122/73         Patient needs labs for lisinopril - Hydrochlorothiazide medication

## 2019-05-06 ENCOUNTER — Encounter: Payer: Self-pay | Admitting: Family Medicine

## 2019-05-06 ENCOUNTER — Other Ambulatory Visit: Payer: Self-pay

## 2019-05-06 ENCOUNTER — Ambulatory Visit (INDEPENDENT_AMBULATORY_CARE_PROVIDER_SITE_OTHER): Payer: BC Managed Care – PPO | Admitting: Family Medicine

## 2019-05-06 VITALS — BP 133/89 | HR 87 | Temp 98.6°F | Ht 75.0 in | Wt 254.0 lb

## 2019-05-06 DIAGNOSIS — J45901 Unspecified asthma with (acute) exacerbation: Secondary | ICD-10-CM

## 2019-05-06 DIAGNOSIS — I10 Essential (primary) hypertension: Secondary | ICD-10-CM | POA: Diagnosis not present

## 2019-05-06 DIAGNOSIS — E785 Hyperlipidemia, unspecified: Secondary | ICD-10-CM | POA: Diagnosis not present

## 2019-05-06 DIAGNOSIS — L989 Disorder of the skin and subcutaneous tissue, unspecified: Secondary | ICD-10-CM | POA: Diagnosis not present

## 2019-05-06 DIAGNOSIS — Z6831 Body mass index (BMI) 31.0-31.9, adult: Secondary | ICD-10-CM

## 2019-05-06 MED ORDER — FLOVENT HFA 110 MCG/ACT IN AERO: 1.0000 | INHALATION_SPRAY | Freq: Two times a day (BID) | RESPIRATORY_TRACT | 5 refills | Status: DC

## 2019-05-06 MED ORDER — LISINOPRIL-HYDROCHLOROTHIAZIDE 20-25 MG PO TABS: 1.0000 | ORAL_TABLET | Freq: Every day | ORAL | 1 refills | Status: DC

## 2019-05-06 NOTE — Progress Notes (Signed)
Subjective:  Patient ID: Joshua Levine, male    DOB: 08/21/1950  Age: 69 y.o. MRN: [id number]  CC:  Chief Complaint  Patient presents with  . Follow-up    on asthma and hypertension . pt states he hasn't had any issues with this conditions and his inhaler helps alot. pt also states no issues with his hypertension.  . Mass    pt has a blue raised mass on his L forarm. it appeared 4 months ago. No pain or irritation at the site. Pt would like it looked at    HPI Joshua Levine presents for   Mild persistent asthma without complication: Improved control in September 2020, at that time continued on Flovent 1 puff twice daily, with option of 2 puffs twice daily if persistent need for albuterol.  Flonase nasal spray for allergies.  On flovent 1 puff BID, flonase 1 spr per nostril BID. Started on Zyrtec recently. Doing well. Albuterol used 3-4 times per week.   Hypertension: Lisinopril HCTZ 20/25 mg daily, no new side effects.  Home readings - none.  No regular exercise.  Wt Readings from Last 3 Encounters:  05/06/19 254 lb (115.2 kg)  10/29/18 249 lb 3.2 oz (113 kg)  10/17/18 249 lb 9.6 oz (113.2 kg)   Obesity: Body mass index is 31.75 kg/m. Less active with work. Weight up as above.  No fast food.  Soda- 2 of the 16oz soda. Per day.    BP Readings from Last 3 Encounters:  05/06/19 133/89  10/29/18 122/73  10/17/18 124/77   Lab Results  Component Value Date   CREATININE 1.04 10/17/2018   Hyperlipidemia: Pravastatin 40 mg daily. No new side effects.   Elevated in September 2020, message sent to patient to discuss options.  No changes since that time. Still on niacin 2000mg  per day - no flushing. No myalgias.   Lab Results  Component Value Date   CHOL 196 10/17/2018   HDL 29 (L) 10/17/2018   LDLCALC 100 (H) 10/17/2018   TRIG 394 (H) 10/17/2018   CHOLHDL 6.8 (H) 10/17/2018   Lab Results  Component Value Date   ALT 43 10/17/2018   AST 30 10/17/2018   ALKPHOS 79  10/17/2018   BILITOT 0.4 10/17/2018    Left forearm mass/abnormality. Noticed 4 months ago. Just appeared. No known injury. No pain/itching, no bleeding/discharge. Same size.  Basal cell CA removed in past from arm, neck in past. [facility] - last seen 6 months ago. No concerns.    History Patient Active Problem List   Diagnosis Date Noted  . HTN (hypertension) 12/10/2011  . Hyperlipidemia 12/10/2011  . Erectile dysfunction 12/10/2011   Past Medical History:  Diagnosis Date  . Allergy   . Arthritis   . Basal cell carcinoma   . Erectile dysfunction   . Hyperlipidemia   . Hypertension    Past Surgical History:  Procedure Laterality Date  . LUMBAR DISC SURGERY    . SPINE SURGERY     No Known Allergies Prior to Admission medications   Medication Sig Start Date End Date Taking? Authorizing Provider  albuterol (VENTOLIN HFA) 108 (90 Base) MCG/ACT inhaler INHALE 1-2 PUFFS INTO THE LUNGS EVERY 4 (FOUR) HOURS AS NEEDED FOR WHEEZING OR SHORTNESS OF BREATH. 03/23/19  Yes Wendie Agreste, MD  Ascorbic Acid (VITAMIN C) 100 MG tablet Take 100 mg by mouth daily.   Yes [provider]  fluticasone (FLONASE) 50 MCG/ACT nasal spray Place 2 sprays into both  nostrils daily. 02/20/15  Yes Posey Boyer, MD  fluticasone (FLOVENT HFA) 110 MCG/ACT inhaler Inhale 1 puff into the lungs 2 (two) times daily. 10/17/18  Yes Wendie Agreste, MD  glucosamine-chondroitin 500-400 MG tablet Take 1 tablet by mouth 3 (three) times daily.   Yes [provider]  lisinopril-hydrochlorothiazide (ZESTORETIC) 20-25 MG tablet TAKE 1 TABLET BY MOUTH EVERY DAY 04/30/19  Yes Wendie Agreste, MD  niacin (NIASPAN) 1000 MG CR tablet TAKE 1 TABLET (1,000 MG TOTAL) BY MOUTH AT BEDTIME  "NO MORE REFILLS WITHOUT OFFICE VISIT" 12/30/14  Yes Jacqulynn Cadet, Domingo Mend, PA  pravastatin (PRAVACHOL) 40 MG tablet TAKE 1 TABLET BY MOUTH EVERY DAY 04/30/19  Yes Wendie Agreste, MD   Social History   Socioeconomic  History  . Marital status: Married    Spouse name: Not on file  . Number of children: Not on file  . Years of education: Not on file  . Highest education level: Not on file  Occupational History  . Occupation: Public librarian: [employer]  Tobacco Use  . Smoking status: Former Research scientist (life sciences)  . Smokeless tobacco: Never Used  Substance and Sexual Activity  . Alcohol use: No  . Drug use: No  . Sexual activity: Yes  Other Topics Concern  . Not on file  Social History Narrative   Married. Education: [employer]. Exercise: Walk 2 times a week for 45 minutes.   Social Determinants of Health   Financial Resource Strain:   . Difficulty of Paying Living Expenses:   Food Insecurity:   . Worried About Charity fundraiser in the Last Year:   . Arboriculturist in the Last Year:   Transportation Needs:   . Film/video editor (Medical):   Marland Kitchen Lack of Transportation (Non-Medical):   Physical Activity:   . Days of Exercise per Week:   . Minutes of Exercise per Session:   Stress:   . Feeling of Stress :   Social Connections:   . Frequency of Communication with Friends and Family:   . Frequency of Social Gatherings with Friends and Family:   . Attends Religious Services:   . Active Member of Clubs or Organizations:   . Attends Archivist Meetings:   Marland Kitchen Marital Status:   Intimate Partner Violence:   . Fear of Current or Ex-Partner:   . Emotionally Abused:   Marland Kitchen Physically Abused:   . Sexually Abused:     Review of Systems  Constitutional: Negative for fatigue and unexpected weight change.  Eyes: Negative for visual disturbance.  Respiratory: Negative for cough, chest tightness and shortness of breath.   Cardiovascular: Negative for chest pain, palpitations and leg swelling.  Gastrointestinal: Negative for abdominal pain and blood in stool.  Neurological: Negative for dizziness, light-headedness and headaches.     Objective:   Vitals:   05/06/19 1019  BP: 133/89  Pulse:  87  Temp: 98.6 F (37 C)  TempSrc: Temporal  SpO2: 97%  Weight: 254 lb (115.2 kg)  Height: 6\' 3"  (1.905 m)     Physical Exam Vitals reviewed.  Constitutional:      Appearance: He is well-developed.  HENT:     Head: Normocephalic and atraumatic.  Eyes:     Pupils: Pupils are equal, round, and reactive to light.  Neck:     Vascular: No carotid bruit or JVD.  Cardiovascular:     Rate and Rhythm: Normal rate and regular rhythm.     Heart  sounds: Normal heart sounds. No murmur.  Pulmonary:     Effort: Pulmonary effort is normal.     Breath sounds: Normal breath sounds. No rales.  Skin:    General: Skin is warm and dry.     Comments: .imag   Neurological:     Mental Status: He is alert and oriented to person, place, and time.     Blanches with pressure.     Assessment & Plan:  Maggie Krippner is a 69 y.o. male . Hyperlipidemia, unspecified hyperlipidemia type - Plan: Lipid panel, Comprehensive metabolic panel  -Tolerating pravastatin 40 but likely will need higher dosing.  If lipids elevated, plan for 80 mg dose with 2 of the 40 mg, then if tolerated will send new prescription.  Labs pending  Essential hypertension - Plan: Lipid panel, Comprehensive metabolic panel, lisinopril-hydrochlorothiazide (ZESTORETIC) 20-25 MG tablet  -Borderline, anticipate improvement with weight loss, diet changes.  Continue same dose Zestoretic for now, labs pending.  Asthma with acute exacerbation, unspecified asthma severity, unspecified whether persistent - Plan: fluticasone (FLOVENT HFA) 110 MCG/ACT inhaler  -Increase Flovent to 2 puffs twice daily during allergy season, continue Flonase, Zyrtec, albuterol if needed with RTC precautions  Skin lesion of left arm  -Possible angioma, follow-up with dermatology as planned.  RTC precautions of changing  BMI 31.0-31.9,adult  -Dietary guidance discussed with decrease sugar-containing beverages, increased activity/exercise for weight loss.   Recheck 6 months.  Meds ordered this encounter  Medications  . lisinopril-hydrochlorothiazide (ZESTORETIC) 20-25 MG tablet    Sig: Take 1 tablet by mouth daily.    Dispense:  90 tablet    Refill:  1  . fluticasone (FLOVENT HFA) 110 MCG/ACT inhaler    Sig: Inhale 1 puff into the lungs 2 (two) times daily.    Dispense:  1 Inhaler    Refill:  5   Patient Instructions    If cholesterol is still elevated - try 2 of the pravastatin each day.  If you tolerate that dose, I can send in the 80mg  dose.   Increase activity/exercise and cut back on soda for weight loss.   Increase flovent to 2 puffs twice per day during allergy season., continue zyrtec and flonase. Albuterol if needed.   Bump on arm may be hemangioma or collection of blood vessels. Have dermatology look at that area at next visit.   Return to the clinic or go to the nearest emergency room if any of your symptoms worsen or new symptoms occur.   If you have lab work done today you will be contacted with your lab results within the next 2 weeks.  If you have not heard from [place] then please contact us. The fastest way to get your results is to register for My Chart.   IF you received an x-ray today, you will receive an invoice from [facility]. Please contact [facility] at [phone] with questions or concerns regarding your invoice.   IF you received labwork today, you will receive an invoice from [facility]. Please contact [facility] at [phone] with questions or concerns regarding your invoice.   Our billing staff will not be able to assist you with questions regarding bills from these companies.  You will be contacted with the lab results as soon as they are available. The fastest way to get your results is to activate your My Chart account. Instructions are located on the last page of this paperwork. If you have not heard from [place] regarding the results in 2 weeks,  please contact this office.          Signed, [name], MD [facility]

## 2019-05-06 NOTE — Patient Instructions (Addendum)
  If cholesterol is still elevated - try 2 of the pravastatin each day.  If you tolerate that dose, I can send in the 80mg  dose.   Increase activity/exercise and cut back on soda for weight loss.   Increase flovent to 2 puffs twice per day during allergy season., continue zyrtec and flonase. Albuterol if needed.   Bump on arm may be angioma or collection of blood vessels. Have dermatology look at that area at next visit.   Return to the clinic or go to the nearest emergency room if any of your symptoms worsen or new symptoms occur.   If you have lab work done today you will be contacted with your lab results within the next 2 weeks.  If you have not heard from [place] then please contact us. The fastest way to get your results is to register for My Chart.   IF you received an x-ray today, you will receive an invoice from [facility]. Please contact [facility] at [phone] with questions or concerns regarding your invoice.   IF you received labwork today, you will receive an invoice from [facility]. Please contact [facility] at [phone] with questions or concerns regarding your invoice.   Our billing staff will not be able to assist you with questions regarding bills from these companies.  You will be contacted with the lab results as soon as they are available. The fastest way to get your results is to activate your My Chart account. Instructions are located on the last page of this paperwork. If you have not heard from [place] regarding the results in 2 weeks, please contact this office.

## 2019-05-07 LAB — COMPREHENSIVE METABOLIC PANEL
ALT: 37 IU/L (ref 0–44)
AST: 21 IU/L (ref 0–40)
Albumin/Globulin Ratio: 1.8 (ref 1.2–2.2)
Albumin: 4.5 g/dL (ref 3.8–4.8)
Alkaline Phosphatase: 91 IU/L (ref 39–117)
BUN/Creatinine Ratio: 14 (ref 10–24)
BUN: 15 mg/dL (ref 8–27)
Bilirubin Total: 0.5 mg/dL (ref 0.0–1.2)
CO2: 17 mmol/L — ABNORMAL LOW (ref 20–29)
Calcium: 9.7 mg/dL (ref 8.6–10.2)
Chloride: 101 mmol/L (ref 96–106)
Creatinine, Ser: 1.1 mg/dL (ref 0.76–1.27)
GFR calc Af Amer: 80 mL/min/{1.73_m2} (ref >59)
GFR calc non Af Amer: 69 mL/min/{1.73_m2} (ref >59)
Globulin, Total: 2.5 g/dL (ref 1.5–4.5)
Glucose: 101 mg/dL — ABNORMAL HIGH (ref 65–99)
Potassium: 4.4 mmol/L (ref 3.5–5.2)
Sodium: 135 mmol/L (ref 134–144)
Total Protein: 7 g/dL (ref 6.0–8.5)

## 2019-05-07 LAB — LIPID PANEL
Chol/HDL Ratio: 6.1 ratio — ABNORMAL HIGH (ref 0.0–5.0)
Cholesterol, Total: 170 mg/dL (ref 100–199)
HDL: 28 mg/dL — ABNORMAL LOW (ref >39)
LDL Chol Calc (NIH): 86 mg/dL (ref 0–99)
Triglycerides: 343 mg/dL — ABNORMAL HIGH (ref 0–149)
VLDL Cholesterol Cal: 56 mg/dL — ABNORMAL HIGH (ref 5–40)

## 2019-05-14 ENCOUNTER — Telehealth: Payer: Self-pay | Admitting: Family Medicine

## 2019-05-14 NOTE — Telephone Encounter (Signed)
Patient requested out of work note for 05/06/2019 and return to work 05/07/2019, spoke to patient and sent note to [place].

## 2019-05-14 NOTE — Telephone Encounter (Signed)
Pt would like a work note excusing him from missing work on [facility] 05/06/19. He would like [place] to email this to [email or web address]

## 2019-07-03 ENCOUNTER — Encounter: Payer: Self-pay | Admitting: Family Medicine

## 2019-07-04 MED ORDER — PRAVASTATIN SODIUM 80 MG PO TABS: 80.0000 mg | ORAL_TABLET | Freq: Every day | ORAL | 1 refills | Status: DC

## 2019-07-04 NOTE — Telephone Encounter (Signed)
Pt states he is tolerating pravastatin 80 mg well, but is running out of his 40 mg tablets. Can you send in a new Rx for 80 mg tablets instead?

## 2019-09-09 ENCOUNTER — Other Ambulatory Visit: Payer: Self-pay | Admitting: Family Medicine

## 2019-09-09 DIAGNOSIS — I10 Essential (primary) hypertension: Secondary | ICD-10-CM

## 2019-09-10 ENCOUNTER — Encounter: Payer: Self-pay | Admitting: Family Medicine

## 2019-09-10 DIAGNOSIS — I10 Essential (primary) hypertension: Secondary | ICD-10-CM

## 2019-09-11 MED ORDER — LISINOPRIL-HYDROCHLOROTHIAZIDE 20-25 MG PO TABS: 1.0000 | ORAL_TABLET | Freq: Every day | ORAL | 1 refills | Status: DC

## 2019-09-11 NOTE — Telephone Encounter (Signed)
Lisinopril hct refill ordered, although 1 refill should have been present from order in April.   Please clarify info about Dr. Trudie Reed in prior message.

## 2019-10-31 ENCOUNTER — Other Ambulatory Visit: Payer: Self-pay | Admitting: Family Medicine

## 2019-10-31 DIAGNOSIS — J45901 Unspecified asthma with (acute) exacerbation: Secondary | ICD-10-CM

## 2019-11-06 ENCOUNTER — Ambulatory Visit: Payer: Medicare Other | Admitting: Family Medicine

## 2019-11-06 ENCOUNTER — Encounter: Payer: Self-pay | Admitting: Registered Nurse

## 2019-11-06 ENCOUNTER — Ambulatory Visit (INDEPENDENT_AMBULATORY_CARE_PROVIDER_SITE_OTHER): Payer: BC Managed Care – PPO | Admitting: Registered Nurse

## 2019-11-06 ENCOUNTER — Other Ambulatory Visit: Payer: Self-pay

## 2019-11-06 VITALS — BP 136/88 | HR 81 | Temp 97.9°F | Resp 18 | Ht 75.0 in | Wt 255.0 lb

## 2019-11-06 DIAGNOSIS — Z832 Family history of diseases of the blood and blood-forming organs and certain disorders involving the immune mechanism: Secondary | ICD-10-CM | POA: Diagnosis not present

## 2019-11-06 DIAGNOSIS — I10 Essential (primary) hypertension: Secondary | ICD-10-CM | POA: Diagnosis not present

## 2019-11-06 DIAGNOSIS — J45901 Unspecified asthma with (acute) exacerbation: Secondary | ICD-10-CM

## 2019-11-06 DIAGNOSIS — E785 Hyperlipidemia, unspecified: Secondary | ICD-10-CM | POA: Diagnosis not present

## 2019-11-06 DIAGNOSIS — Z23 Encounter for immunization: Secondary | ICD-10-CM | POA: Diagnosis not present

## 2019-11-06 MED ORDER — PRAVASTATIN SODIUM 80 MG PO TABS: 80.0000 mg | ORAL_TABLET | Freq: Every day | ORAL | 1 refills | Status: DC

## 2019-11-06 MED ORDER — ALBUTEROL SULFATE HFA 108 (90 BASE) MCG/ACT IN AERS: 1.0000 | INHALATION_SPRAY | RESPIRATORY_TRACT | 1 refills | Status: DC | PRN

## 2019-11-06 MED ORDER — LISINOPRIL-HYDROCHLOROTHIAZIDE 20-25 MG PO TABS: 1.0000 | ORAL_TABLET | Freq: Every day | ORAL | 1 refills | Status: DC

## 2019-11-06 NOTE — Progress Notes (Signed)
Established Patient Office Visit  Subjective:  Patient ID: Joshua Levine, male    DOB: November 09, 1950  Age: 69 y.o. MRN: [id number]  CC:  Chief Complaint  Patient presents with  . Follow-up    6 months follow up. PAtient has no other questions or concerns     HPI Joshua Levine presents for 6 mo follow up   HTN: taking lisinopril-hctz 20-25mg  PO qd as rx. No complaints or concerns. Denies AEs. Good compliance. Has been working on diet and exercise. Denies symptoms of hypertension. No home readings.  HLD: taking pravastatin as prescribed. Again - working on diet . No acute concerns.  Needs refill on albuterol. flovent continues to control his symptoms well. 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 Discussed avoiding clots and symptoms of note.   Past Medical History:  Diagnosis Date  . Allergy   . Arthritis   . Basal cell carcinoma   . Erectile dysfunction   . Hyperlipidemia   . Hypertension     Past Surgical History:  Procedure Laterality Date  . LUMBAR DISC SURGERY    . SPINE SURGERY      Family History  Problem Relation Age of Onset  . Heart disease Mother   . Parkinson's disease Father   . Alzheimer's disease Father   . Factor V Leiden deficiency Brother     Social History   Socioeconomic History  . Marital status: Married    Spouse name: Not on file  . Number of children: Not on file  . Years of education: Not on file  . Highest education level: Not on file  Occupational History  . Occupation: Public librarian: [employer]  Tobacco Use  . Smoking status: Former Research scientist (life sciences)  . Smokeless tobacco: Never Used  Substance and Sexual  Activity  . Alcohol use: No  . Drug use: No  . Sexual activity: Yes  Other Topics Concern  . Not on file  Social History Narrative   Married. Education: [employer]. Exercise: Walk 2 times a week for 45 minutes.   Social Determinants of Health   Financial Resource Strain:   . Difficulty of Paying Living Expenses: Not on file  Food Insecurity:   . Worried About Charity fundraiser in the Last Year: Not on file  . Ran Out of Food in the Last Year: Not on file  Transportation Needs:   . Lack of Transportation (Medical): Not on file  . Lack of Transportation (Non-Medical): Not on file  Physical Activity:   . Days of Exercise per Week: Not on file  . Minutes of Exercise per Session: Not on file  Stress:   . Feeling of Stress : Not on file  Social Connections:   . Frequency of Communication with Friends and Family: Not on file  . Frequency of Social Gatherings with Friends and Family: Not on file  . Attends Religious Services: Not on file  . Active Member of Clubs or Organizations: Not on file  . Attends Archivist Meetings: Not on file  . Marital Status: Not on file  Intimate Partner Violence:   . Fear of Current or Ex-Partner: Not on  file  . Emotionally Abused: Not on file  . Physically Abused: Not on file  . Sexually Abused: Not on file    Outpatient Medications Prior to Visit  Medication Sig Dispense Refill  . Ascorbic Acid (VITAMIN C) 100 MG tablet Take 100 mg by mouth daily.    Marland Kitchen FLOVENT HFA 110 MCG/ACT inhaler TAKE 1 PUFF BY MOUTH TWICE A DAY 12 each 2  . fluticasone (FLONASE) 50 MCG/ACT nasal spray Place 2 sprays into both nostrils daily. 48 g 3  . glucosamine-chondroitin 500-400 MG tablet Take 1 tablet by mouth 3 (three) times daily.    . niacin (NIASPAN) 1000 MG CR tablet TAKE 1 TABLET (1,000 MG TOTAL) BY MOUTH AT BEDTIME  "NO MORE REFILLS WITHOUT OFFICE VISIT" 30 tablet 0  . albuterol (VENTOLIN HFA) 108 (90 Base) MCG/ACT inhaler INHALE 1-2 PUFFS INTO THE  LUNGS EVERY 4 (FOUR) HOURS AS NEEDED FOR WHEEZING OR SHORTNESS OF BREATH. 18 g 1  . lisinopril-hydrochlorothiazide (ZESTORETIC) 20-25 MG tablet Take 1 tablet by mouth daily. 90 tablet 1  . pravastatin (PRAVACHOL) 80 MG tablet Take 1 tablet (80 mg total) by mouth daily. 90 tablet 1   No facility-administered medications prior to visit.    No Known Allergies  ROS Review of Systems  Constitutional: Negative.   HENT: Negative.   Eyes: Negative.   Respiratory: Negative.   Cardiovascular: Negative.   Gastrointestinal: Negative.   Genitourinary: Negative.   Musculoskeletal: Negative.   Skin: Negative.   Neurological: Negative.   Psychiatric/Behavioral: Negative.       Objective:    Physical Exam Constitutional:      General: He is not in acute distress.    Appearance: Normal appearance. He is normal weight. He is not ill-appearing, toxic-appearing or diaphoretic.  Cardiovascular:     Rate and Rhythm: Normal rate and regular rhythm.     Heart sounds: Normal heart sounds. No murmur heard.  No friction rub. No gallop.   Pulmonary:     Effort: Pulmonary effort is normal. No respiratory distress.     Breath sounds: Normal breath sounds. No stridor. No wheezing, rhonchi or rales.  Chest:     Chest wall: No tenderness.  Neurological:     General: No focal deficit present.     Mental Status: He is alert and oriented to person, place, and time. Mental status is at baseline.  Psychiatric:        Mood and Affect: Mood normal.        Behavior: Behavior normal.        Thought Content: Thought content normal.        Judgment: Judgment normal.     BP 136/88   Pulse 81   Temp 97.9 F (36.6 C) (Temporal)   Resp 18   Ht 6\' 3"  (1.905 m)   Wt 255 lb (115.7 kg)   SpO2 98%   BMI 31.87 kg/m  Wt Readings from Last 3 Encounters:  11/06/19 255 lb (115.7 kg)  05/06/19 254 lb (115.2 kg)  10/29/18 249 lb 3.2 oz (113 kg)     There are no preventive care reminders to display for this  patient.  There are no preventive care reminders to display for this patient.  Lab Results  Component Value Date   TSH 2.958 11/12/2012   Lab Results  Component Value Date   WBC 8.8 01/13/2014   HGB 16.2 01/13/2014   HCT 49.2 01/13/2014   MCV 96.8 01/13/2014   PLT 229 11/12/2012  Lab Results  Component Value Date   NA 135 05/06/2019   K 4.4 05/06/2019   CO2 17 (L) 05/06/2019   GLUCOSE 101 (H) 05/06/2019   BUN 15 05/06/2019   CREATININE 1.10 05/06/2019   BILITOT 0.5 05/06/2019   ALKPHOS 91 05/06/2019   AST 21 05/06/2019   ALT 37 05/06/2019   PROT 7.0 05/06/2019   ALBUMIN 4.5 05/06/2019   CALCIUM 9.7 05/06/2019   Lab Results  Component Value Date   CHOL 170 05/06/2019   Lab Results  Component Value Date   HDL 28 (L) 05/06/2019   Lab Results  Component Value Date   LDLCALC 86 05/06/2019   Lab Results  Component Value Date   TRIG 343 (H) 05/06/2019   Lab Results  Component Value Date   CHOLHDL 6.1 (H) 05/06/2019   No results found for: HGBA1C    Assessment & Plan:   Problem List Items Addressed This Visit      Cardiovascular and Mediastinum   Essential hypertension   Relevant Medications   lisinopril-hydrochlorothiazide (ZESTORETIC) 20-25 MG tablet   pravastatin (PRAVACHOL) 80 MG tablet   Other Relevant Orders   Comprehensive metabolic panel     Other   Hyperlipidemia   Relevant Medications   lisinopril-hydrochlorothiazide (ZESTORETIC) 20-25 MG tablet   pravastatin (PRAVACHOL) 80 MG tablet   Other Relevant Orders   Lipid Panel   Family history of factor V Leiden mutation   Relevant Orders   Ambulatory referral to Genetics    Other Visit Diagnoses    Flu vaccine need    -  Primary   Relevant Orders   Flu Vaccine QUAD High Dose(Fluad) (Completed)   Asthma with acute exacerbation, unspecified asthma severity, unspecified whether persistent       Relevant Medications   albuterol (VENTOLIN HFA) 108 (90 Base) MCG/ACT inhaler      Meds  ordered this encounter  Medications  . albuterol (VENTOLIN HFA) 108 (90 Base) MCG/ACT inhaler    Sig: Inhale 1-2 puffs into the lungs every 4 (four) hours as needed for wheezing or shortness of breath.    Dispense:  18 g    Refill:  1    Order Specific Question:   Supervising Provider    Answer:   Carlota Raspberry, JEFFREY R [[id number]]  . lisinopril-hydrochlorothiazide (ZESTORETIC) 20-25 MG tablet    Sig: Take 1 tablet by mouth daily.    Dispense:  90 tablet    Refill:  1    Order Specific Question:   Supervising Provider    Answer:   Carlota Raspberry, JEFFREY R [[id number]]  . pravastatin (PRAVACHOL) 80 MG tablet    Sig: Take 1 tablet (80 mg total) by mouth daily.    Dispense:  90 tablet    Refill:  1    Order Specific Question:   Supervising Provider    Answer:   Carlota Raspberry, JEFFREY R [[id number]]    Follow-up: No follow-ups on file.   PLAN  Labs collected, will follow up as warranted  bp wnl today  Continue meds as prescribed  Return to see PCP in 6 mo  Referred to genetics for family hx of factor v leiden  Patient encouraged to call clinic with any questions, comments, or concerns.  Maximiano Coss, NP

## 2019-11-06 NOTE — Patient Instructions (Signed)
° ° ° °  If you have lab work done today you will be contacted with your lab results within the next 2 weeks.  If you have not heard from us then please contact us. The fastest way to get your results is to register for My Chart. ° ° °IF you received an x-ray today, you will receive an invoice from [facility]. Please contact [facility] at [phone] with questions or concerns regarding your invoice.  ° °IF you received labwork today, you will receive an invoice from [facility]. Please contact [facility] at [phone] with questions or concerns regarding your invoice.  ° °Our billing staff will not be able to assist you with questions regarding bills from these companies. ° °You will be contacted with the lab results as soon as they are available. The fastest way to get your results is to activate your My Chart account. Instructions are located on the last page of this paperwork. If you have not heard from us regarding the results in 2 weeks, please contact this office. °  ° ° ° °

## 2019-11-07 LAB — COMPREHENSIVE METABOLIC PANEL
ALT: 46 IU/L — ABNORMAL HIGH (ref 0–44)
AST: 29 IU/L (ref 0–40)
Albumin/Globulin Ratio: 2.3 — ABNORMAL HIGH (ref 1.2–2.2)
Albumin: 5 g/dL — ABNORMAL HIGH (ref 3.8–4.8)
Alkaline Phosphatase: 84 IU/L (ref 44–121)
BUN/Creatinine Ratio: 15 (ref 10–24)
BUN: 16 mg/dL (ref 8–27)
Bilirubin Total: 0.3 mg/dL (ref 0.0–1.2)
CO2: 21 mmol/L (ref 20–29)
Calcium: 9.4 mg/dL (ref 8.6–10.2)
Chloride: 100 mmol/L (ref 96–106)
Creatinine, Ser: 1.04 mg/dL (ref 0.76–1.27)
GFR calc Af Amer: 85 mL/min/{1.73_m2} (ref >59)
GFR calc non Af Amer: 74 mL/min/{1.73_m2} (ref >59)
Globulin, Total: 2.2 g/dL (ref 1.5–4.5)
Glucose: 78 mg/dL (ref 65–99)
Potassium: 4.3 mmol/L (ref 3.5–5.2)
Sodium: 137 mmol/L (ref 134–144)
Total Protein: 7.2 g/dL (ref 6.0–8.5)

## 2019-11-07 LAB — LIPID PANEL
Chol/HDL Ratio: 6.6 ratio — ABNORMAL HIGH (ref 0.0–5.0)
Cholesterol, Total: 166 mg/dL (ref 100–199)
HDL: 25 mg/dL — ABNORMAL LOW (ref >39)
LDL Chol Calc (NIH): 75 mg/dL (ref 0–99)
Triglycerides: 414 mg/dL — ABNORMAL HIGH (ref 0–149)
VLDL Cholesterol Cal: 66 mg/dL — ABNORMAL HIGH (ref 5–40)

## 2020-01-07 ENCOUNTER — Telehealth (INDEPENDENT_AMBULATORY_CARE_PROVIDER_SITE_OTHER): Payer: BC Managed Care – PPO | Admitting: Family Medicine

## 2020-01-07 ENCOUNTER — Encounter: Payer: Self-pay | Admitting: Family Medicine

## 2020-01-07 ENCOUNTER — Other Ambulatory Visit: Payer: Self-pay

## 2020-01-07 DIAGNOSIS — R059 Cough, unspecified: Secondary | ICD-10-CM | POA: Diagnosis not present

## 2020-01-07 DIAGNOSIS — J309 Allergic rhinitis, unspecified: Secondary | ICD-10-CM

## 2020-01-07 DIAGNOSIS — J4530 Mild persistent asthma, uncomplicated: Secondary | ICD-10-CM

## 2020-01-07 MED ORDER — BENZONATATE 100 MG PO CAPS: 100.0000 mg | ORAL_CAPSULE | Freq: Two times a day (BID) | ORAL | 0 refills | Status: DC | PRN

## 2020-01-07 MED ORDER — PREDNISONE 20 MG PO TABS: 20.0000 mg | ORAL_TABLET | Freq: Every day | ORAL | 0 refills | Status: AC

## 2020-01-07 MED ORDER — LORATADINE 10 MG PO TABS: 10.0000 mg | ORAL_TABLET | Freq: Every evening | ORAL | 2 refills | Status: DC

## 2020-01-07 NOTE — Patient Instructions (Addendum)
Restart all home inhalers Take prednisone for 4 days at home in the morning Use Flonase nasal spray daily Use Tessalon as needed for cough Take claritin nightly  Viral Respiratory Infection A viral respiratory infection is an illness that affects parts of the body that are used for breathing. These include the lungs, nose, and throat. It is caused by a germ called a virus. Some examples of this kind of infection are:  A cold.  The flu (influenza).  A respiratory syncytial virus (RSV) infection. A person who gets this illness may have the following symptoms:  A stuffy or runny nose.  Yellow or green fluid in the nose.  A cough.  Sneezing.  Tiredness (fatigue).  Achy muscles.  A sore throat.  Sweating or chills.  A fever.  A headache. Follow these instructions at home: Managing pain and congestion  Take over-the-counter and prescription medicines only as told by your doctor.  If you have a sore throat, gargle with salt water. Do this 3-4 times per day or as needed. To make a salt-water mixture, dissolve -1 tsp of salt in 1 cup of warm water. Make sure that all the salt dissolves.  Use nose drops made from salt water. This helps with stuffiness (congestion). It also helps soften the skin around your nose.  Drink enough fluid to keep your pee (urine) pale yellow. General instructions   Rest as much as possible.  Do not drink alcohol.  Do not use any products that have nicotine or tobacco, such as cigarettes and e-cigarettes. If you need help quitting, ask your doctor.  Keep all follow-up visits as told by your doctor. This is important. How is this prevented?   Get a flu shot every year. Ask your doctor when you should get your flu shot.  Do not let other people get your germs. If you are sick: ? Stay home from work or school. ? Wash your hands with soap and water often. Wash your hands after you cough or sneeze. If soap and water are not available, use  hand sanitizer.  Avoid contact with people who are sick during cold and flu season. This is in fall and winter. Get help if:  Your symptoms last for 10 days or longer.  Your symptoms get worse over time.  You have a fever.  You have very bad pain in your face or forehead.  Parts of your jaw or neck become very swollen. Get help right away if:  You feel pain or pressure in your chest.  You have shortness of breath.  You faint or feel like you will faint.  You keep throwing up (vomiting).  You feel confused. Summary  A viral respiratory infection is an illness that affects parts of the body that are used for breathing.  Examples of this illness include a cold, the flu, and respiratory syncytial virus (RSV) infection.  The infection can cause a runny nose, cough, sneezing, sore throat, and fever.  Follow what your doctor tells you about taking medicines, drinking lots of fluid, washing your hands, resting at home, and avoiding people who are sick. This information is not intended to replace advice given to you by your health care provider. Make sure you discuss any questions you have with your health care provider. Document Revised: 01/25/2018 Document Reviewed: 02/27/2017 Elsevier Patient Education  [employer].   If you have lab work done today you will be contacted with your lab results within the next 2 weeks.  If you have not heard from [place] then please contact us. The fastest way to get your results is to register for My Chart.   IF you received an x-ray today, you will receive an invoice from [facility]. Please contact [facility] at [phone] with questions or concerns regarding your invoice.   IF you received labwork today, you will receive an invoice from [facility]. Please contact [facility] at [phone] with questions or concerns regarding your invoice.   Our billing staff will not be able to assist you with questions regarding bills  from these companies.  You will be contacted with the lab results as soon as they are available. The fastest way to get your results is to activate your My Chart account. Instructions are located on the last page of this paperwork. If you have not heard from [place] regarding the results in 2 weeks, please contact this office.

## 2020-01-07 NOTE — Progress Notes (Addendum)
Virtual Visit Note  I connected with patient on 01/07/20 at 0835 by phone due to unable to work Epic video visit and verified that I am speaking with the correct person using two identifiers. Joshua Levine is currently located at home and no family members are currently with them during visit. The provider, Joshua Quint Nishika Parkhurst, FNP is located in their office at time of visit.  I discussed the limitations, risks, security and privacy concerns of performing an evaluation and management service by telephone and the availability of in person appointments. I also discussed with the patient that there may be a patient responsible charge related to this service. The patient expressed understanding and agreed to proceed.   I provided 20 minutes of non-face-to-face time during this encounter.  Chief Complaint  Patient presents with  . Nasal Congestion    started monday last week , had negative covid test friday/ taking sudafed   . chest congestion    intermittent low grade fevers, dark green mucous from congestion / taking mucinex    HPI ? Started last Mon night with sinus and chest congestion Sleepless night Felt feverish all day on Tues and Monday Fever was approaching 100 Cycling between 97-99 Lots of coughing, green mucus noted Hard to sleep Covid test Friday negative Long term history of sinus infections Rare to no sinus pressure History of Asthma and allergies Had stopped Flovent and not using albuterl Itchy watery eyes   No Known Allergies  Prior to Admission medications   Medication Sig Start Date End Date Taking? Authorizing Provider  Ascorbic Acid (VITAMIN C) 100 MG tablet Take 100 mg by mouth daily.    Yes Joshua Levine  albuterol (VENTOLIN HFA) 108 (90 Base) MCG/ACT inhaler Inhale 1-2 puffs into the lungs every 4 (four) hours as needed for wheezing or shortness of breath. Patient not taking: Reported on 01/07/2020 11/06/19   Joshua Levine  FLOVENT HFA 110  MCG/ACT inhaler TAKE 1 PUFF BY MOUTH TWICE A DAY Patient not taking: Reported on 01/07/2020 10/31/19   Joshua Levine  fluticasone [facility]) 50 MCG/ACT nasal spray Place 2 sprays into both nostrils daily. Patient not taking: Reported on 01/07/2020 02/20/15   Joshua Levine  glucosamine-chondroitin 500-400 MG tablet Take 1 tablet by mouth 3 (three) times daily. Patient not taking: Reported on 01/07/2020    Joshua Levine  lisinopril-hydrochlorothiazide (ZESTORETIC) 20-25 MG tablet Take 1 tablet by mouth daily. Patient not taking: Reported on 01/07/2020 11/06/19   Joshua Levine  niacin (NIASPAN) 1000 MG CR tablet TAKE 1 TABLET (1,000 MG TOTAL) BY MOUTH AT BEDTIME  "NO MORE REFILLS WITHOUT OFFICE VISIT" Patient not taking: Reported on 01/07/2020 12/30/14   Joshua Mons, PA  pravastatin (PRAVACHOL) 80 MG tablet Take 1 tablet (80 mg total) by mouth daily. Patient not taking: Reported on 01/07/2020 11/06/19   Joshua Levine    Past Medical History:  Diagnosis Date  . Allergy   . Arthritis   . Basal cell carcinoma   . Erectile dysfunction   . Hyperlipidemia   . Hypertension     Past Surgical History:  Procedure Laterality Date  . LUMBAR DISC SURGERY    . SPINE SURGERY      Social History   Tobacco Use  . Smoking status: Former Research scientist (life sciences)  . Smokeless tobacco: Never Used  Substance Use Topics  . Alcohol use: No    Family History  Problem Relation Age of Onset  . Heart  disease Mother   . Parkinson's disease Father   . Alzheimer's disease Father   . Factor V Leiden deficiency Brother     Review of Systems  Constitutional: Negative for chills, fever and malaise/fatigue.  HENT: Positive for congestion. Negative for ear discharge, ear pain, sinus pain and sore throat.   Eyes: Positive for redness. Negative for pain and discharge.  Respiratory: Positive for cough and sputum production. Negative for shortness of breath and wheezing.   Cardiovascular:  Negative for chest pain.  Neurological: Negative for headaches.    Objective  Constitutional:      General: She is not in acute distress.    Appearance: Normal appearance. She is not ill-appearing.   Pulmonary:     Effort: Pulmonary effort is normal. No respiratory distress.  Neurological:     Mental Status: She is alert and oriented to person, place, and time.  Psychiatric:        Mood and Affect: Mood normal.        Behavior: Behavior normal.     ASSESSMENT and PLAN  Problem List Items Addressed This Visit    None    Visit Diagnoses    Cough    -  Primary   Relevant Medications   benzonatate (TESSALON) 100 MG capsule as needed for cough   Mild persistent asthma without complication       Relevant Medications   predniSONE (DELTASONE) 20 MG tablet for 4 days Restart home inhalers   Allergic rhinitis, unspecified seasonality, unspecified trigger       Relevant Medications   loratadine (CLARITIN) 10 MG tablet night Flonase daily     RTC precautions provided R/se/b of medications discussed  Return if symptoms worsen or fail to improve.    The above assessment and management plan was discussed with the patient. The patient verbalized understanding of and has agreed to the management plan. Patient is aware to call the clinic if symptoms persist or worsen. Patient is aware when to return to the clinic for a follow-up visit. Patient educated on when it is appropriate to go to the emergency department.     Joshua Foley Jakiya Bookbinder, FNP-BC [facility] [place], [place] [address] Ph.  [phone] Fax [phone]  I have reviewed and agree with above documentation. Joshua Caroli, Levine

## 2020-02-24 DIAGNOSIS — L905 Scar conditions and fibrosis of skin: Secondary | ICD-10-CM | POA: Diagnosis not present

## 2020-02-24 DIAGNOSIS — I8393 Asymptomatic varicose veins of bilateral lower extremities: Secondary | ICD-10-CM | POA: Diagnosis not present

## 2020-02-24 DIAGNOSIS — D1801 Hemangioma of skin and subcutaneous tissue: Secondary | ICD-10-CM | POA: Diagnosis not present

## 2020-02-24 DIAGNOSIS — L738 Other specified follicular disorders: Secondary | ICD-10-CM | POA: Diagnosis not present

## 2020-03-29 ENCOUNTER — Other Ambulatory Visit: Payer: Self-pay | Admitting: Family Medicine

## 2020-03-29 DIAGNOSIS — J309 Allergic rhinitis, unspecified: Secondary | ICD-10-CM

## 2020-03-29 NOTE — Telephone Encounter (Signed)
Requested Prescriptions  Pending Prescriptions Disp Refills  . loratadine (CLARITIN) 10 MG tablet [Pharmacy Med Name: LORATADINE 10 MG TABLET] 90 tablet 0    Sig: TAKE 1 TABLET BY MOUTH EVERYDAY AT BEDTIME     Ear, Nose, and Throat:  Antihistamines Passed - 03/29/2020  9:12 AM      Passed - Valid encounter within last 12 months    Recent Outpatient Visits          2 months ago Cough   [facility], Laurita Quint, FNP   4 months ago Flu vaccine need   [facility], Delfino Lovett, NP   10 months ago Hyperlipidemia, unspecified hyperlipidemia type   [facility], Ranell Patrick, MD   1 year ago Mild persistent asthma without complication   [facility], Ranell Patrick, MD   1 year ago Asthma with acute exacerbation, unspecified asthma severity, unspecified whether persistent   [facility], Ranell Patrick, MD

## 2020-06-26 ENCOUNTER — Other Ambulatory Visit: Payer: Self-pay | Admitting: Family Medicine

## 2020-06-26 DIAGNOSIS — J309 Allergic rhinitis, unspecified: Secondary | ICD-10-CM

## 2020-07-02 ENCOUNTER — Other Ambulatory Visit: Payer: Self-pay | Admitting: Registered Nurse

## 2020-07-02 DIAGNOSIS — E785 Hyperlipidemia, unspecified: Secondary | ICD-10-CM

## 2020-07-04 ENCOUNTER — Other Ambulatory Visit: Payer: Self-pay | Admitting: Family Medicine

## 2020-07-04 ENCOUNTER — Other Ambulatory Visit: Payer: Self-pay | Admitting: Registered Nurse

## 2020-07-04 DIAGNOSIS — J45901 Unspecified asthma with (acute) exacerbation: Secondary | ICD-10-CM

## 2020-07-04 DIAGNOSIS — I10 Essential (primary) hypertension: Secondary | ICD-10-CM

## 2020-07-05 ENCOUNTER — Other Ambulatory Visit: Payer: Self-pay | Admitting: Family Medicine

## 2020-07-05 DIAGNOSIS — J309 Allergic rhinitis, unspecified: Secondary | ICD-10-CM

## 2020-07-05 DIAGNOSIS — J45901 Unspecified asthma with (acute) exacerbation: Secondary | ICD-10-CM

## 2020-09-18 ENCOUNTER — Other Ambulatory Visit: Payer: Self-pay | Admitting: Family Medicine

## 2020-09-18 DIAGNOSIS — J309 Allergic rhinitis, unspecified: Secondary | ICD-10-CM

## 2020-10-21 ENCOUNTER — Other Ambulatory Visit: Payer: Self-pay | Admitting: Family Medicine

## 2020-10-21 DIAGNOSIS — J45901 Unspecified asthma with (acute) exacerbation: Secondary | ICD-10-CM

## 2020-11-02 ENCOUNTER — Telehealth (INDEPENDENT_AMBULATORY_CARE_PROVIDER_SITE_OTHER): Payer: Self-pay | Admitting: Family Medicine

## 2020-11-02 ENCOUNTER — Other Ambulatory Visit: Payer: Self-pay

## 2020-11-02 VITALS — Temp 99.2°F

## 2020-11-02 DIAGNOSIS — U071 COVID-19: Secondary | ICD-10-CM

## 2020-11-02 NOTE — Progress Notes (Signed)
Virtual Visit via Video Note  I connected with Joshua Levine on 11/02/20 at 4:34 PM by a video enabled telemedicine application and verified that I am speaking with the correct person using two identifiers.  Patient location:home and by self.  My location: office - [place].    I discussed the limitations, risks, security and privacy concerns of performing an evaluation and management service by telephone and the availability of in person appointments. I also discussed with the patient that there may be a patient responsible charge related to this service. The patient expressed understanding and agreed to proceed, consent obtained  Reverted to audio at end of visit as difficulty with video feed.   Chief complaint:  Chief Complaint  Patient presents with   Covid Positive    Positive on Saturday, sxs fever, body aches, chills, night sweats, chest and head congestion, sinus drainage, brain fog, unsteady gait     History of Present Illness: Joshua Levine is a 70 y.o. male  Covid 48 infection: Initial symptoms started with some increased drainage, discharge through last week.  felt worse 3 days ago - subjective fever, chills -some congestion, body aches, chills, cough, nasal drainage. Headache. Positive test next day on 10/31/2020, more body aches and fever on Saturday.  Somewhat better yesterday and today.  Temps have been improving.  Slight unsteady with walking, no focal weakness.  Tx: motrin, mucinex, 1 sudafed per day.  Temp 99.2 today.  Chest congestion is improving. No dyspnea (only with cough fit). No confusion - slight brain fog only, having to focus more.   Less body aches.  Able to drink fluids.   Covid risk of complication score of 5. Covid vaccine x 2 with booster in June of this year.   Some possible long covid symptoms after prior infection 2 years ago. Brain fog.   Antivirals discussed, but he thinks outside of window.   Patient Active Problem List   Diagnosis  Date Noted   Family history of factor V Leiden mutation 11/06/2019   Essential hypertension 12/10/2011   Hyperlipidemia 12/10/2011   Erectile dysfunction 12/10/2011   Past Medical History:  Diagnosis Date   Allergy    Arthritis    Basal cell carcinoma    Erectile dysfunction    Hyperlipidemia    Hypertension    Past Surgical History:  Procedure Laterality Date   LUMBAR DISC SURGERY     SPINE SURGERY     No Known Allergies Prior to Admission medications   Medication Sig Start Date End Date Taking? Authorizing Provider  Ascorbic Acid (VITAMIN C) 100 MG tablet Take 100 mg by mouth daily.    Yes [provider]  FLOVENT HFA 110 MCG/ACT inhaler INHALE 1 PUFF BY MOUTH TWICE A DAY 10/21/20  Yes Wendie Agreste, MD  glucosamine-chondroitin 500-400 MG tablet Take 1 tablet by mouth 3 (three) times daily.   Yes [provider]  lisinopril-hydrochlorothiazide (ZESTORETIC) 20-25 MG tablet TAKE 1 TABLET BY MOUTH EVERY DAY 07/05/20  Yes Maximiano Coss, NP  loratadine (CLARITIN) 10 MG tablet TAKE 1 TABLET BY MOUTH EVERYDAY AT BEDTIME 06/26/20  Yes Wendie Agreste, MD  pravastatin (PRAVACHOL) 80 MG tablet TAKE 1 TABLET BY MOUTH EVERY DAY 07/02/20  Yes Maximiano Coss, NP  albuterol (VENTOLIN HFA) 108 (90 Base) MCG/ACT inhaler Inhale 1-2 puffs into the lungs every 4 (four) hours as needed for wheezing or shortness of breath. Patient not taking: No sig reported 11/06/19   Maximiano Coss, NP  benzonatate [facility])  100 MG capsule Take 1-2 capsules (100-200 mg total) by mouth 2 (two) times daily as needed for cough. Patient not taking: Reported on 11/02/2020 01/07/20   Just, Laurita Quint, FNP  fluticasone (FLONASE) 50 MCG/ACT nasal spray Place 2 sprays into both nostrils daily. Patient not taking: No sig reported 02/20/15   Posey Boyer, MD  niacin (NIASPAN) 1000 MG CR tablet TAKE 1 TABLET (1,000 MG TOTAL) BY MOUTH AT BEDTIME  "NO MORE REFILLS WITHOUT OFFICE VISIT" Patient not taking: No  sig reported 12/30/14   Harrison Mons, PA   Social History   Socioeconomic History   Marital status: Married    Spouse name: Not on file   Number of children: Not on file   Years of education: Not on file   Highest education level: Not on file  Occupational History   Occupation: Automotive    Employer: [employer]  Tobacco Use   Smoking status: Former   Smokeless tobacco: Never  Substance and Sexual Activity   Alcohol use: No   Drug use: No   Sexual activity: Yes  Other Topics Concern   Not on file  Social History Narrative   Married. Education: [employer]. Exercise: Walk 2 times a week for 45 minutes.   Social Determinants of Health   Financial Resource Strain: Not on file  Food Insecurity: Not on file  Transportation Needs: Not on file  Physical Activity: Not on file  Stress: Not on file  Social Connections: Not on file  Intimate Partner Violence: Not on file    Observations/Objective: Vitals:   11/02/20 1553  Temp: 99.2 F (37.3 C)  TempSrc: Oral  Nontoxic appearance on video, speaking in full sentences, no respiratory distress.  Appropriate responses, euthymic mood, all questions were answered with understanding of plan expressed.  Assessment and Plan: COVID-19 virus infection  Questionable date of onset, but likely has been over 5 days.  Primary symptoms were approximately 3 to 4 days ago, but after discussion of antivirals he declined at this time.  Symptoms have been improving.  No red flags on his current symptoms or video exam.  Plan for continued symptomatic care with ER precautions/urgent care precautions given.  Also discussed CDC recommendations regarding quarantine and masking guidelines for return to work.  Letter provided for work.  Follow Up Instructions:  As needed.  I discussed the assessment and treatment plan with the patient. The patient was provided an opportunity to ask questions and all were answered. The patient agreed with the plan and  demonstrated an understanding of the instructions.   The patient was advised to call back or seek an in-person evaluation if the symptoms worsen or if the condition fails to improve as anticipated.   Wendie Agreste, MD

## 2020-11-02 NOTE — Patient Instructions (Addendum)
Mucinex for cough, drink plenty of fluids, continue to rest. I am glad to hear that you are improving. Be seen at urgent care or go to the nearest emergency room if any of your symptoms worsen or new symptoms occur.  Your employer may have specific requirements for return to work, but this information is provided from the CDC. There is a link provided below for more information if needed.   Everyone who has presumed or confirmed COVID-19 should stay home and isolate from other people for at least 5 full days (day 0 is the first day of symptoms or the date of the day of the positive viral test for asymptomatic persons). You can end isolation after 5 full days if you are fever-free for 24 hours without the use of fever-reducing medication and your other symptoms have improved (Loss of taste and smell may persist for weeks or months after recovery and need not delay the end of isolation). You should continue to wear a well-fitting mask around others at home and in public for 5 additional days (day 6 through day 10) after the end of your 5-day isolation period. If you are unable to wear a mask when around others, you should continue to isolate for a full 10 days. Avoid people who have weakened immune systems or are more likely to get very sick from COVID-19, and nursing homes and other high-risk settings, until after at least 10 days.  If you continue to have fever or your other symptoms have not improved after 5 days of isolation, you should wait to end your isolation until you are fever-free for 24 hours without the use of fever-reducing medication and your other symptoms have improved. Continue to wear a well-fitting mask through day 10.   https://brown.org/.html

## 2020-11-30 ENCOUNTER — Encounter: Payer: Self-pay | Admitting: Family Medicine

## 2020-11-30 ENCOUNTER — Other Ambulatory Visit: Payer: Self-pay

## 2020-11-30 ENCOUNTER — Ambulatory Visit (INDEPENDENT_AMBULATORY_CARE_PROVIDER_SITE_OTHER): Payer: BC Managed Care – PPO | Admitting: Family Medicine

## 2020-11-30 ENCOUNTER — Other Ambulatory Visit (INDEPENDENT_AMBULATORY_CARE_PROVIDER_SITE_OTHER): Payer: BC Managed Care – PPO

## 2020-11-30 ENCOUNTER — Ambulatory Visit (INDEPENDENT_AMBULATORY_CARE_PROVIDER_SITE_OTHER)
Admission: RE | Admit: 2020-11-30 | Discharge: 2020-11-30 | Disposition: A | Discharge status: Home / Self Care | Payer: BC Managed Care – PPO | Source: Ambulatory Visit | Attending: Family Medicine | Admitting: Family Medicine

## 2020-11-30 VITALS — BP 118/72 | HR 76 | Temp 98.1°F | Resp 16 | Ht 75.0 in | Wt 248.0 lb

## 2020-11-30 DIAGNOSIS — Z130 Encounter for screening for diseases of the blood and blood-forming organs and certain disorders involving the immune mechanism: Secondary | ICD-10-CM

## 2020-11-30 DIAGNOSIS — I10 Essential (primary) hypertension: Secondary | ICD-10-CM | POA: Diagnosis not present

## 2020-11-30 DIAGNOSIS — U071 COVID-19: Secondary | ICD-10-CM | POA: Diagnosis not present

## 2020-11-30 DIAGNOSIS — Z23 Encounter for immunization: Secondary | ICD-10-CM | POA: Diagnosis not present

## 2020-11-30 DIAGNOSIS — J4530 Mild persistent asthma, uncomplicated: Secondary | ICD-10-CM

## 2020-11-30 DIAGNOSIS — R0602 Shortness of breath: Secondary | ICD-10-CM | POA: Diagnosis not present

## 2020-11-30 DIAGNOSIS — R0609 Other forms of dyspnea: Secondary | ICD-10-CM | POA: Diagnosis not present

## 2020-11-30 DIAGNOSIS — E785 Hyperlipidemia, unspecified: Secondary | ICD-10-CM

## 2020-11-30 DIAGNOSIS — R0981 Nasal congestion: Secondary | ICD-10-CM

## 2020-11-30 DIAGNOSIS — R059 Cough, unspecified: Secondary | ICD-10-CM

## 2020-11-30 DIAGNOSIS — Z125 Encounter for screening for malignant neoplasm of prostate: Secondary | ICD-10-CM

## 2020-11-30 DIAGNOSIS — Z131 Encounter for screening for diabetes mellitus: Secondary | ICD-10-CM

## 2020-11-30 DIAGNOSIS — Z0000 Encounter for general adult medical examination without abnormal findings: Secondary | ICD-10-CM | POA: Diagnosis not present

## 2020-11-30 DIAGNOSIS — J22 Unspecified acute lower respiratory infection: Secondary | ICD-10-CM

## 2020-11-30 DIAGNOSIS — D72829 Elevated white blood cell count, unspecified: Secondary | ICD-10-CM

## 2020-11-30 DIAGNOSIS — J45909 Unspecified asthma, uncomplicated: Secondary | ICD-10-CM | POA: Diagnosis not present

## 2020-11-30 LAB — COMPREHENSIVE METABOLIC PANEL
ALT: 50 U/L (ref 0–53)
AST: 31 U/L (ref 0–37)
Albumin: 4.7 g/dL (ref 3.5–5.2)
Alkaline Phosphatase: 77 U/L (ref 39–117)
BUN: 20 mg/dL (ref 6–23)
CO2: 26 mEq/L (ref 19–32)
Calcium: 9.8 mg/dL (ref 8.4–10.5)
Chloride: 100 mEq/L (ref 96–112)
Creatinine, Ser: 1.05 mg/dL (ref 0.40–1.50)
GFR: 72.77 mL/min (ref >60.00)
Glucose, Bld: 84 mg/dL (ref 70–99)
Potassium: 4.4 mEq/L (ref 3.5–5.1)
Sodium: 136 mEq/L (ref 135–145)
Total Bilirubin: 0.5 mg/dL (ref 0.2–1.2)
Total Protein: 7.3 g/dL (ref 6.0–8.3)

## 2020-11-30 LAB — LIPID PANEL
Cholesterol: 159 mg/dL (ref 0–200)
HDL: 33.4 mg/dL — ABNORMAL LOW (ref >39.00)
NonHDL: 125.16
Total CHOL/HDL Ratio: 5
Triglycerides: 290 mg/dL — ABNORMAL HIGH (ref 0.0–149.0)
VLDL: 58 mg/dL — ABNORMAL HIGH (ref 0.0–40.0)

## 2020-11-30 LAB — CBC WITH DIFFERENTIAL/PLATELET
Basophils Absolute: 0.1 10*3/uL (ref 0.0–0.1)
Basophils Relative: 0.8 % (ref 0.0–3.0)
Eosinophils Absolute: 0.2 10*3/uL (ref 0.0–0.7)
Eosinophils Relative: 1.4 % (ref 0.0–5.0)
HCT: 48 % (ref 39.0–52.0)
Hemoglobin: 16 g/dL (ref 13.0–17.0)
Lymphocytes Relative: 28.2 % (ref 12.0–46.0)
Lymphs Abs: 3.4 10*3/uL (ref 0.7–4.0)
MCHC: 33.3 g/dL (ref 30.0–36.0)
MCV: 91.8 fl (ref 78.0–100.0)
Monocytes Absolute: 0.8 10*3/uL (ref 0.1–1.0)
Monocytes Relative: 6.4 % (ref 3.0–12.0)
Neutro Abs: 7.6 10*3/uL (ref 1.4–7.7)
Neutrophils Relative %: 63.2 % (ref 43.0–77.0)
Platelets: 294 10*3/uL (ref 150.0–400.0)
RBC: 5.23 Mil/uL (ref 4.22–5.81)
RDW: 13.8 % (ref 11.5–15.5)
WBC: 12.1 10*3/uL — ABNORMAL HIGH (ref 4.0–10.5)

## 2020-11-30 LAB — HEMOGLOBIN A1C: Hgb A1c MFr Bld: 6.4 % (ref 4.6–6.5)

## 2020-11-30 LAB — PSA: PSA: 0.67 ng/mL (ref 0.10–4.00)

## 2020-11-30 NOTE — Progress Notes (Deleted)
Additional note as previous note locked.  Chest was clear, no wheezes no rales no rhonchi.  CCQ:FJUVQ rhythm, rate 69, no apparent acute findings or significant changes from 12/13/2017.

## 2020-11-30 NOTE — Patient Instructions (Addendum)
Stop flovent, start advair. Ok to use albuterol only if needed. Zapk for possible bronchitis/chest infection.  Xray at [facility].  Recheck in 3 weeks. I would recommend meeting with cardiology if shortness of breath not improving, but the EKG overall looked okay today, no significant changes from 2019. Return to the clinic or go to the nearest emergency room if any of your symptoms worsen or new symptoms occur.  Congrats on recent weight loss. Walking for exercise, but avoid exertion until we discuss your shortness of breath further next visit.   Preventive Care 69 Years and Older, Male Preventive care refers to lifestyle choices and visits with your health care provider that can promote health and wellness. This includes: A yearly physical exam. This is also called an annual wellness visit. Regular dental and eye exams. Immunizations. Screening for certain conditions. Healthy lifestyle choices, such as: Eating a healthy diet. Getting regular exercise. Not using drugs or products that contain nicotine and tobacco. Limiting alcohol use. What can I expect for my preventive care visit? Physical exam Your health care provider will check your: Height and weight. These may be used to calculate your BMI (body mass index). BMI is a measurement that tells if you are at a healthy weight. Heart rate and blood pressure. Body temperature. Skin for abnormal spots. Counseling Your health care provider may ask you questions about your: Past medical problems. Family's medical history. Alcohol, tobacco, and drug use. Emotional well-being. Home life and relationship well-being. Sexual activity. Diet, exercise, and sleep habits. History of falls. Memory and ability to understand (cognition). Work and work Statistician. Access to firearms. What immunizations do I need? Vaccines are usually given at various ages, according to a schedule. Your health care provider will recommend vaccines for  you based on your age, medical history, and lifestyle or other factors, such as travel or where you work. What tests do I need? Blood tests Lipid and cholesterol levels. These may be checked every 5 years, or more often depending on your overall health. Hepatitis C test. Hepatitis B test. Screening Lung cancer screening. You may have this screening every year starting at age 699 if you have a 30-pack-year history of smoking and currently smoke or have quit within the past 15 years. Colorectal cancer screening. All adults should have this screening starting at age 69 and continuing until age 45. Your health care provider may recommend screening at age 69 if you are at increased risk. You will have tests every 1-10 years, depending on your results and the type of screening test. Prostate cancer screening. Recommendations will vary depending on your family history and other risks. Genital exam to check for testicular cancer or hernias. Diabetes screening. This is done by checking your blood sugar (glucose) after you have not eaten for a while (fasting). You may have this done every 1-3 years. Abdominal aortic aneurysm (AAA) screening. You may need this if you are a current or former smoker. STD (sexually transmitted disease) testing, if you are at risk. Follow these instructions at home: Eating and drinking  Eat a diet that includes fresh fruits and vegetables, whole grains, lean protein, and low-fat dairy products. Limit your intake of foods with high amounts of sugar, saturated fats, and salt. Take vitamin and mineral supplements as recommended by your health care provider. Do not drink alcohol if your health care provider tells you not to drink. If you drink alcohol: Limit how much you have to 0-2 drinks a day. Be aware  of how much alcohol is in your drink. In the [place] one drink equals one 12 oz bottle of beer (355 mL), one 5 oz glass of wine (148 mL), or one 1 oz glass of hard liquor (44  mL). Lifestyle Take daily care of your teeth and gums. Brush your teeth every morning and night with fluoride toothpaste. Floss one time each day. Stay active. Exercise for at least 30 minutes 5 or more days each week. Do not use any products that contain nicotine or tobacco, such as cigarettes, e-cigarettes, and chewing tobacco. If you need help quitting, ask your health care provider. Do not use drugs. If you are sexually active, practice safe sex. Use a condom or other form of protection to prevent STIs (sexually transmitted infections). Talk with your health care provider about taking a low-dose aspirin or statin. Find healthy ways to cope with stress, such as: Meditation, yoga, or listening to music. Journaling. Talking to a trusted person. Spending time with friends and family. Safety Always wear your seat belt while driving or riding in a vehicle. Do not drive: If you have been drinking alcohol. Do not ride with someone who has been drinking. When you are tired or distracted. While texting. Wear a helmet and other protective equipment during sports activities. If you have firearms in your house, make sure you follow all gun safety procedures. What's next? Visit your health care provider once a year for an annual wellness visit. Ask your health care provider how often you should have your eyes and teeth checked. Stay up to date on all vaccines. This information is not intended to replace advice given to you by your health care provider. Make sure you discuss any questions you have with your health care provider. Document Revised: 03/27/2020 Document Reviewed: 01/11/2018 Elsevier Patient Education  2022 [employer].

## 2020-12-01 LAB — LDL CHOLESTEROL, DIRECT: Direct LDL: 104 mg/dL

## 2020-12-01 MED ORDER — ADVAIR HFA 115-21 MCG/ACT IN AERO: 2.0000 | INHALATION_SPRAY | Freq: Two times a day (BID) | RESPIRATORY_TRACT | 12 refills | Status: DC

## 2020-12-01 NOTE — Progress Notes (Signed)
Subjective:  Patient ID: Joshua Levine, male    DOB: Sep 21, 1950  Age: 70 y.o. MRN: [id number]  CC:  Chief Complaint  Patient presents with   Annual Exam    HPI Joshua Levine presents for  Annual exam and other follow up.  Note recreated as system locked, autosaved  note, added info as needed.      Video visit for COVID-19 infection October 3.  Symptomatic care at that time as symptoms have been present over 5 days.  Recovering - some chest congestion - better than worse at times. Occasionally dyspnea with exertion. No fevers. No chest pain.  Some discolored phlegm at times and sinus congestion.    Hypertension: Lisinopril HCTZ 20/25 mg daily. Winded for years with more exertion. No chest pain. No recent stress testing. Mom with CAD in her early 60's, fatal MI at 69yo.   Home readings: none. No new side effects with meds.     BP Readings from Last 3 Encounters:  11/30/20 118/72  11/06/19 136/88  05/06/19 133/89    Recent Labs       Lab Results  Component Value Date    CREATININE 1.04 11/06/2019      Hyperlipidemia: Pravastatin 80 mg daily.  Due for updated testing. No myalgias/new side effects.  Recent Labs       Lab Results  Component Value Date    CHOL 166 11/06/2019    HDL 25 (L) 11/06/2019    LDLCALC 75 11/06/2019    TRIG 414 (H) 11/06/2019    CHOLHDL 6.6 (H) 11/06/2019      Recent Labs       Lab Results  Component Value Date    ALT 46 (H) 11/06/2019    AST 29 11/06/2019    ALKPHOS 84 11/06/2019    BILITOT 0.3 11/06/2019      Mild persistent asthma Previously treated with Flovent, albuterol as needed. Using flovent 2-4 puffs per day. Albuterol every 2-3 days. Prior to covid infection - few times per week.    Fall screening Fall Risk 10/29/2018 05/06/2019 11/06/2019 01/07/2020 11/02/2020  Falls in the past year? 0 0 0 0 0  Was there an injury with Fall? 0 - 0 0 0  Fall Risk Category Calculator 0 - 0 0 0  Fall Risk Category Low - Low Low Low  Patient  Fall Risk Level - Low fall risk - Low fall risk Low fall risk  Patient at Risk for Falls Due to - - - - No Fall Risks  Fall risk Follow up Falls evaluation completed Falls evaluation completed Falls evaluation completed Falls evaluation completed Falls evaluation completed      Lighting in home:adequate.  Loose rugs/carpets/pets: cats at home.  Stairs:flight with handrail.  Grab bars in bathroom: none.  Timed up and go: 9 seconds, normal gait, no instability.    Depression Screening: Depression screen [facility] 2/9 01/07/2020 11/06/2019 05/06/2019 10/29/2018 10/17/2018  Decreased Interest 0 0 0 0 0  Down, Depressed, Hopeless 0 0 0 0 0  PHQ - 2 Score 0 0 0 0 0  PHQ2 score of 0 today.    CancerScreening: Colon cancer screening:declines.  Prostate: Recent Labs       Lab Results  Component Value Date    PSA1 0.6 09/15/2016    PSA 0.64 01/13/2014    PSA 0.55 11/12/2012    PSA 0.47 03/11/2012    Father had prostate CA.  The natural history of prostate cancer and ongoing controversy regarding  screening and potential treatment outcomes of prostate cancer has been discussed with the patient. The meaning of a false positive PSA and a false negative PSA has been discussed. He indicates understanding of the limitations of this screening test and wishes to proceed with screening PSA testing.           Immunization History  Administered Date(s) Administered   Fluad Quad(high Dose 65+) 10/17/2018, 11/06/2019   Influenza Split 11/19/2011, 10/27/2014, 12/02/2015   Influenza, High Dose Seasonal PF 12/13/2017   Influenza,inj,Quad PF,6+ Mos 11/12/2012, 01/13/2014   PFIZER(Purple Top)SARS-COV-2 Vaccination 05/04/2019, 06/01/2019, 01/17/2020, 07/04/2020   Pneumococcal Conjugate-13 09/14/2017   Pneumococcal Polysaccharide-23 10/17/2018   Tdap 11/12/2012  Shingles -  will check with his insurance regarding coverage here.  Flu vaccine - today.  Covid booster in June.    Functional Status Survey:   Functional Status Survey: Is the patient deaf or have difficulty hearing?: No Does the patient have difficulty seeing, even when wearing glasses/contacts?: No Does the patient have difficulty concentrating, remembering, or making decisions?: No Does the patient have difficulty walking or climbing stairs?: No Does the patient have difficulty dressing or bathing?: No Does the patient have difficulty doing errands alone such as visiting a doctor's office or shopping?: No  Memory Screen: 6CIT Screen 11/30/2020 12/13/2017  What Year? 0 points 0 points  What month? 0 points 0 points  What time? 0 points 0 points  Count back from 20 0 points 0 points  Months in reverse 0 points 0 points  Repeat phrase 0 points 0 points  Total Score 0 0      Alcohol Screening: no alcohol    Tobacco: No tobacco.    No results found. Optho/optometry: wears glasses. Last visit 1 year ago - cataracts developing.    Dental: Every 6 months.    Exercise/Body mass index is 31 kg/m. Obesity.weight 234 in 2018.  Appetite decreased  Walking every other week.     Wt Readings from Last 3 Encounters:  11/30/20 248 lb (112.5 kg)  11/06/19 255 lb (115.7 kg)  05/06/19 254 lb (115.2 kg)        Advanced Directives:   has living will and HCPOA.            History Patient Active Problem List   Diagnosis Date Noted   Family history of factor V Leiden mutation 11/06/2019   Essential hypertension 12/10/2011   Hyperlipidemia 12/10/2011   Erectile dysfunction 12/10/2011   Past Medical History:  Diagnosis Date   Allergy    Arthritis    Basal cell carcinoma    Erectile dysfunction    Hyperlipidemia    Hypertension    Past Surgical History:  Procedure Laterality Date   LUMBAR DISC SURGERY     SPINE SURGERY     No Known Allergies Prior to Admission medications   Medication Sig Start Date End Date Taking? Authorizing Provider  albuterol (VENTOLIN HFA) 108 (90 Base) MCG/ACT inhaler Inhale 1-2  puffs into the lungs every 4 (four) hours as needed for wheezing or shortness of breath. Patient not taking: No sig reported 11/06/19   Maximiano Coss, NP  Ascorbic Acid (VITAMIN C) 100 MG tablet Take 100 mg by mouth daily.     [provider]  benzonatate (TESSALON) 100 MG capsule Take 1-2 capsules (100-200 mg total) by mouth 2 (two) times daily as needed for cough. Patient not taking: Reported on 11/02/2020 01/07/20   Just, Laurita Quint, FNP  FLOVENT HFA 110 MCG/ACT  inhaler INHALE 1 PUFF BY MOUTH TWICE A DAY 10/21/20   Wendie Agreste, MD  fluticasone [facility]) 50 MCG/ACT nasal spray Place 2 sprays into both nostrils daily. Patient not taking: No sig reported 02/20/15   Posey Boyer, MD  glucosamine-chondroitin 500-400 MG tablet Take 1 tablet by mouth 3 (three) times daily.    [provider]  lisinopril-hydrochlorothiazide (ZESTORETIC) 20-25 MG tablet TAKE 1 TABLET BY MOUTH EVERY DAY 07/05/20   Maximiano Coss, NP  loratadine (CLARITIN) 10 MG tablet TAKE 1 TABLET BY MOUTH EVERYDAY AT BEDTIME 06/26/20   Wendie Agreste, MD  pravastatin (PRAVACHOL) 80 MG tablet TAKE 1 TABLET BY MOUTH EVERY DAY 07/02/20   Maximiano Coss, NP   Social History   Socioeconomic History   Marital status: Married    Spouse name: Not on file   Number of children: Not on file   Years of education: Not on file   Highest education level: Not on file  Occupational History   Occupation: Automotive    Employer: [employer]  Tobacco Use   Smoking status: Former   Smokeless tobacco: Never  Substance and Sexual Activity   Alcohol use: No   Drug use: No   Sexual activity: Yes  Other Topics Concern   Not on file  Social History Narrative   Married. Education: [employer]. Exercise: Walk 2 times a week for 45 minutes.   Social Determinants of Health   Financial Resource Strain: Not on file  Food Insecurity: Not on file  Transportation Needs: Not on file  Physical Activity: Not on file  Stress: Not on  file  Social Connections: Not on file  Intimate Partner Violence: Not on file    Review of Systems 13 point review of systems per patient health survey noted.  Negative other than as indicated above or in HPI.    Objective:   Vitals:   11/30/20 1403  BP: 118/72  Pulse: 76  Resp: 16  Temp: 98.1 F (36.7 C)  SpO2: 97%  Weight: 248 lb (112.5 kg)  Height: 6\' 3"  (1.905 m)     Physical Exam Vitals reviewed.  Constitutional:      Appearance: He is well-developed.  HENT:     Head: Normocephalic and atraumatic.     Right Ear: External ear normal.     Left Ear: External ear normal.  Eyes:     Conjunctiva/sclera: Conjunctivae normal.     Pupils: Pupils are equal, round, and reactive to light.  Neck:     Thyroid: No thyromegaly.  Cardiovascular:     Rate and Rhythm: Normal rate and regular rhythm.     Heart sounds: Normal heart sounds.  Pulmonary:     Effort: Pulmonary effort is normal. No respiratory distress.     Breath sounds: Normal breath sounds. No stridor. No wheezing, rhonchi or rales.  Abdominal:     General: There is no distension.     Palpations: Abdomen is soft.     Tenderness: There is no abdominal tenderness.  Musculoskeletal:        General: No tenderness. Normal range of motion.     Cervical back: Normal range of motion and neck supple.  Lymphadenopathy:     Cervical: No cervical adenopathy.  Skin:    General: Skin is warm and dry.  Neurological:     Mental Status: He is alert and oriented to person, place, and time.     Deep Tendon Reflexes: Reflexes are normal and symmetric.  Psychiatric:  Behavior: Behavior normal.   EKG: Sinus rhythm, rate 69.  No apparent acute findings or significant changes compared to EKG performed on 12/13/17.   49 minutes spent during visit, including chart review, discussion of DOE, counseling and assimilation of information, exam, discussion of plan, and chart completion.    Assessment & Plan:  Joshua Levine is a  70 y.o. male . Annual physical exam - anticipatory guidance as below in AVS, screening labs if needed. Health maintenance items as above in HPI discussed/recommended as applicable.  - no concerning responses on depression, fall, or functional status screening. Any positive responses noted as above. Advanced directives discussed as in CHL.   Hyperlipidemia, unspecified hyperlipidemia type - Plan: CANCELED: Lipid panel  -  Stable, tolerating current regimen. Medications refilled. Labs pending as above.   Essential hypertension - Plan: CANCELED: Comprehensive metabolic panel, CANCELED: Lipid panel, CANCELED: Hemoglobin A1c  -  Stable, tolerating current regimen. Medications refilled. Labs pending as above.   Screening for deficiency anemia - Plan: CANCELED: CBC with Differential/Platelet  Screening for diabetes mellitus - Plan: CANCELED: Hemoglobin A1c  Screening for prostate cancer - Plan: CANCELED: PSA  LRTI (lower respiratory tract infection) COVID-19 virus infection  -Lungs clear on exam.  Possible postinfectious bronchitis, less likely pneumonia from previous COVID infection.  Has had some improvement and worsening, including discolored phlegm.  Start azithromycin, check imaging with chest x-ray, RTC/ER precautions.  Sinus congestion  -Symptomatic care with Flonase as needed, Z-Pak as above, RTC precautions  Cough, unspecified type DOE (dyspnea on exertion) - Plan: DG Chest 2 View, EKG 12-Lead Mild persistent asthma without complication  -Dyspnea on exertion could be related to uncontrolled asthma versus deconditioning versus less likely cardiac cause.  Does have cardiac risk factors of hypertension, hyperlipidemia, family history.  We will start with adjustment of asthma regimen, stop Flovent, start Advair.  Recheck next few weeks.  Imaging as above.  ER precautions given.  Has albuterol if needed for breakthrough wheeze but anticipate decreased need.  Need for immunization against  influenza - Plan: Flu Vaccine QUAD High Dose(Fluad)   No orders of the defined types were placed in this encounter.  Patient Instructions  Stop flovent, start advair. Ok to use albuterol only if needed. Zapk for possible bronchitis/chest infection.  [facility].  Recheck in 3 weeks. I would recommend meeting with cardiology if shortness of breath not improving, but the EKG overall looked okay today, no significant changes from 2019. Return to the clinic or go to the nearest emergency room if any of your symptoms worsen or new symptoms occur.  Congrats on recent weight loss. Walking for exercise, but avoid exertion until we discuss your shortness of breath further next visit.   Preventive Care 50 Years and Older, Male Preventive care refers to lifestyle choices and visits with your health care provider that can promote health and wellness. This includes: A yearly physical exam. This is also called an annual wellness visit. Regular dental and eye exams. Immunizations. Screening for certain conditions. Healthy lifestyle choices, such as: Eating a healthy diet. Getting regular exercise. Not using drugs or products that contain nicotine and tobacco. Limiting alcohol use. What can I expect for my preventive care visit? Physical exam Your health care provider will check your: Height and weight. These may be used to calculate your BMI (body mass index). BMI is a measurement that tells if you are at a healthy weight. Heart rate and blood pressure. Body temperature. Skin for  abnormal spots. Counseling Your health care provider may ask you questions about your: Past medical problems. Family's medical history. Alcohol, tobacco, and drug use. Emotional well-being. Home life and relationship well-being. Sexual activity. Diet, exercise, and sleep habits. History of falls. Memory and ability to understand (cognition). Work and work Statistician. Access to firearms. What  immunizations do I need? Vaccines are usually given at various ages, according to a schedule. Your health care provider will recommend vaccines for you based on your age, medical history, and lifestyle or other factors, such as travel or where you work. What tests do I need? Blood tests Lipid and cholesterol levels. These may be checked every 5 years, or more often depending on your overall health. Hepatitis C test. Hepatitis B test. Screening Lung cancer screening. You may have this screening every year starting at age 51 if you have a 30-pack-year history of smoking and currently smoke or have quit within the past 15 years. Colorectal cancer screening. All adults should have this screening starting at age 44 and continuing until age 59. Your health care provider may recommend screening at age 66 if you are at increased risk. You will have tests every 1-10 years, depending on your results and the type of screening test. Prostate cancer screening. Recommendations will vary depending on your family history and other risks. Genital exam to check for testicular cancer or hernias. Diabetes screening. This is done by checking your blood sugar (glucose) after you have not eaten for a while (fasting). You may have this done every 1-3 years. Abdominal aortic aneurysm (AAA) screening. You may need this if you are a current or former smoker. STD (sexually transmitted disease) testing, if you are at risk. Follow these instructions at home: Eating and drinking  Eat a diet that includes fresh fruits and vegetables, whole grains, lean protein, and low-fat dairy products. Limit your intake of foods with high amounts of sugar, saturated fats, and salt. Take vitamin and mineral supplements as recommended by your health care provider. Do not drink alcohol if your health care provider tells you not to drink. If you drink alcohol: Limit how much you have to 0-2 drinks a day. Be aware of how much alcohol is  in your drink. In the [place] one drink equals one 12 oz bottle of beer (355 mL), one 5 oz glass of wine (148 mL), or one 1 oz glass of hard liquor (44 mL). Lifestyle Take daily care of your teeth and gums. Brush your teeth every morning and night with fluoride toothpaste. Floss one time each day. Stay active. Exercise for at least 30 minutes 5 or more days each week. Do not use any products that contain nicotine or tobacco, such as cigarettes, e-cigarettes, and chewing tobacco. If you need help quitting, ask your health care provider. Do not use drugs. If you are sexually active, practice safe sex. Use a condom or other form of protection to prevent STIs (sexually transmitted infections). Talk with your health care provider about taking a low-dose aspirin or statin. Find healthy ways to cope with stress, such as: Meditation, yoga, or listening to music. Journaling. Talking to a trusted person. Spending time with friends and family. Safety Always wear your seat belt while driving or riding in a vehicle. Do not drive: If you have been drinking alcohol. Do not ride with someone who has been drinking. When you are tired or distracted. While texting. Wear a helmet and other protective equipment during sports activities. If you have firearms  in your house, make sure you follow all gun safety procedures. What's next? Visit your health care provider once a year for an annual wellness visit. Ask your health care provider how often you should have your eyes and teeth checked. Stay up to date on all vaccines. This information is not intended to replace advice given to you by your health care provider. Make sure you discuss any questions you have with your health care provider. Document Revised: 03/27/2020 Document Reviewed: 01/11/2018 Elsevier Patient Education  2022 [place],   [name], MD [place], [place]  [facility] 12/01/20 11:29 AM

## 2020-12-08 NOTE — Progress Notes (Signed)
See lab notes

## 2020-12-21 ENCOUNTER — Ambulatory Visit: Payer: BC Managed Care – PPO | Admitting: Family Medicine

## 2020-12-21 ENCOUNTER — Encounter: Payer: Self-pay | Admitting: Family Medicine

## 2020-12-21 VITALS — BP 126/78 | HR 81 | Temp 98.2°F | Resp 17 | Ht 75.0 in | Wt 247.6 lb

## 2020-12-21 DIAGNOSIS — R059 Cough, unspecified: Secondary | ICD-10-CM | POA: Diagnosis not present

## 2020-12-21 DIAGNOSIS — J4530 Mild persistent asthma, uncomplicated: Secondary | ICD-10-CM | POA: Diagnosis not present

## 2020-12-21 DIAGNOSIS — Z23 Encounter for immunization: Secondary | ICD-10-CM

## 2020-12-21 DIAGNOSIS — R0981 Nasal congestion: Secondary | ICD-10-CM | POA: Diagnosis not present

## 2020-12-21 MED ORDER — AZELASTINE-FLUTICASONE 137-50 MCG/ACT NA SUSP: 1.0000 | Freq: Two times a day (BID) | NASAL | 1 refills | Status: DC

## 2020-12-21 MED ORDER — AZITHROMYCIN 250 MG PO TABS: ORAL_TABLET | ORAL | 0 refills | Status: AC

## 2020-12-21 NOTE — Patient Instructions (Addendum)
Try Dymista in place of flonase nasal spray to see if that helps with congestion.  Can try mucinex temporarily to see if that helps, but stop if any increased wheeze or asthma symptoms.   Continue advair with albuterol only as needed. If chest congestion not improving with change in nasal spray, then start Zpak. If continued need for albuterol daily, then let me know in next 2 weeks and I can order higher dose of advair.   Return to the clinic or go to the nearest emergency room if any of your symptoms worsen or new symptoms occur.

## 2020-12-21 NOTE — Progress Notes (Signed)
Subjective:  Patient ID: Joshua Levine, male    DOB: 08/08/1950  Age: 70 y.o. MRN: [id number]  CC:  Chief Complaint  Patient presents with   Cough    Pt here for recheck on 3 weeks notes some improvement, reports some SOB with strenuous activity     HPI Joshua Levine presents for  Follow-up October 31 visit.  Cough, dyspnea/mild persistent asthma. Also treated for COVID-19 infection on video visit October 3.  Treated with symptomatic care, he was recovering at his October 31 visit but still some congestion w improvement sometimes then worsened sometimes.  Occasional dyspnea with exertion, and discolored phlegm with sinus congestion.  Treat as possible bronchitis after initial infection, treated with azithromycin,  albuterol as needed.  However he had had frequent albuterol use prior to COVID infection.  Flovent was changed to Advair. Chest x-ray with no acute abnormalities, no effusion, lungs clear on 11/30/20. Zpak was not sent last visit.  Still some coughing fits during the day. Unknown if discolored phlegm. No known fevers. No exertional activity, no shortness of breath. Feels like out of shape. No chest pains.  Albuterol - using once per day - to help with congestion. Helps with loosening phlegm.  Advair - 2 puffs BID. No new side effects - no significant change in cough.  No heartburn.  No side effects with meds.  Chronic nasal congestion. 2 sprays bilateral BID.   History Patient Active Problem List   Diagnosis Date Noted   Family history of factor V Leiden mutation 11/06/2019   Essential hypertension 12/10/2011   Hyperlipidemia 12/10/2011   Erectile dysfunction 12/10/2011   Past Medical History:  Diagnosis Date   Allergy    Arthritis    Basal cell carcinoma    Erectile dysfunction    Hyperlipidemia    Hypertension    Past Surgical History:  Procedure Laterality Date   LUMBAR DISC SURGERY     SPINE SURGERY     No Known Allergies Prior to Admission medications    Medication Sig Start Date End Date Taking? Authorizing Provider  Ascorbic Acid (VITAMIN C) 100 MG tablet Take 100 mg by mouth daily.    Yes [provider]  fluticasone-salmeterol (ADVAIR HFA) 115-21 MCG/ACT inhaler Inhale 2 puffs into the lungs 2 (two) times daily. 12/01/20  Yes Wendie Agreste, MD  glucosamine-chondroitin 500-400 MG tablet Take 1 tablet by mouth 3 (three) times daily.   Yes [provider]  lisinopril-hydrochlorothiazide (ZESTORETIC) 20-25 MG tablet TAKE 1 TABLET BY MOUTH EVERY DAY 07/05/20  Yes Maximiano Coss, NP  loratadine (CLARITIN) 10 MG tablet TAKE 1 TABLET BY MOUTH EVERYDAY AT BEDTIME 06/26/20  Yes Wendie Agreste, MD  pravastatin (PRAVACHOL) 80 MG tablet TAKE 1 TABLET BY MOUTH EVERY DAY 07/02/20  Yes Maximiano Coss, NP  albuterol (VENTOLIN HFA) 108 (90 Base) MCG/ACT inhaler Inhale 1-2 puffs into the lungs every 4 (four) hours as needed for wheezing or shortness of breath. Patient not taking: Reported on 01/07/2020 11/06/19   Maximiano Coss, NP  benzonatate (TESSALON) 100 MG capsule Take 1-2 capsules (100-200 mg total) by mouth 2 (two) times daily as needed for cough. Patient not taking: Reported on 11/02/2020 01/07/20   Just, Laurita Quint, FNP  fluticasone (FLONASE) 50 MCG/ACT nasal spray Place 2 sprays into both nostrils daily. Patient not taking: Reported on 01/07/2020 02/20/15   Posey Boyer, MD   Social History   Socioeconomic History   Marital status: Married    Spouse name:  Not on file   Number of children: Not on file   Years of education: Not on file   Highest education level: Not on file  Occupational History   Occupation: Public librarian: [employer]  Tobacco Use   Smoking status: Former   Smokeless tobacco: Never  Substance and Sexual Activity   Alcohol use: No   Drug use: No   Sexual activity: Yes  Other Topics Concern   Not on file  Social History Narrative   Married. Education: [employer]. Exercise: Walk 2 times a week for  45 minutes.   Social Determinants of Health   Financial Resource Strain: Not on file  Food Insecurity: Not on file  Transportation Needs: Not on file  Physical Activity: Not on file  Stress: Not on file  Social Connections: Not on file  Intimate Partner Violence: Not on file    Review of Systems  Constitutional:  Negative for fatigue and unexpected weight change.  Eyes:  Negative for visual disturbance.  Respiratory:  Negative for cough, chest tightness and shortness of breath.   Cardiovascular:  Negative for chest pain, palpitations and leg swelling.  Gastrointestinal:  Negative for abdominal pain and blood in stool.  Neurological:  Negative for dizziness, light-headedness and headaches.    Objective:   Vitals:   12/21/20 1604  BP: 126/78  Pulse: 81  Resp: 17  Temp: 98.2 F (36.8 C)  TempSrc: Temporal  SpO2: 97%  Weight: 247 lb 9.6 oz (112.3 kg)  Height: 6\' 3"  (1.905 m)     Physical Exam Vitals reviewed.  Constitutional:      Appearance: He is well-developed.  HENT:     Head: Normocephalic and atraumatic.  Neck:     Vascular: No carotid bruit or JVD.  Cardiovascular:     Rate and Rhythm: Normal rate and regular rhythm.     Heart sounds: Normal heart sounds. No murmur heard. Pulmonary:     Effort: Pulmonary effort is normal. No respiratory distress.     Breath sounds: Normal breath sounds. No stridor. No wheezing, rhonchi or rales.  Musculoskeletal:     Right lower leg: No edema.     Left lower leg: No edema.  Skin:    General: Skin is warm and dry.  Neurological:     Mental Status: He is alert and oriented to person, place, and time.  Psychiatric:        Mood and Affect: Mood normal.        Behavior: Behavior normal.       Assessment & Plan:  Joshua Levine is a 70 y.o. male . Cough, unspecified type - Plan: azithromycin (ZITHROMAX) 250 MG tablet  Chronic nasal congestion  Mild persistent asthma without complication  Need for shingles vaccine  - Plan: Varicella-zoster vaccine IM   Reassuring exam today, and recent x-ray.  Still possible asthma contribution of cough versus postinfectious cough/bronchitis versus congestion with postnasal drip/chronic nasal congestion.  -Continue same dose Advair for now but if not improving use of albuterol, consider higher dose of steroid component.  -Albuterol for breakthrough wheeze, cough, shortness of breath  -Change Flonase to Dymista  -Azithromycin if not improving cough/congestion next 2 weeks  -8-week follow-up, sooner if worse or new symptoms  First shingles vaccine given today.  Repeat at follow-up visit  Meds ordered this encounter  Medications   Azelastine-Fluticasone 137-50 MCG/ACT SUSP    Sig: Place 1 spray into the nose every 12 (twelve) hours.  Dispense:  23 g    Refill:  1   azithromycin (ZITHROMAX) 250 MG tablet    Sig: Take 2 tablets on day 1, then 1 tablet daily on days 2 through 5    Dispense:  6 tablet    Refill:  0   Patient Instructions  Try Dymista in place of flonase nasal spray to see if that helps with congestion.  Can try mucinex temporarily to see if that helps, but stop if any increased wheeze or asthma symptoms.   Continue advair with albuterol only as needed. If chest congestion not improving with change in nasal spray, then start Zpak. If continued need for albuterol daily, then let me know in next 2 weeks and I can order higher dose of advair.   Return to the clinic or go to the nearest emergency room if any of your symptoms worsen or new symptoms occur.       Signed,   [name], MD [place] 12/21/20 5:22 PM

## 2020-12-22 ENCOUNTER — Telehealth: Payer: Self-pay

## 2020-12-22 NOTE — Telephone Encounter (Signed)
Called pt to schedule an appointment for 8 weeks from today for follow up and 2nd shingles vaccine.

## 2020-12-22 NOTE — Telephone Encounter (Signed)
Patient returned [name]'s call and I scheduled 8 week follow up per message

## 2021-01-02 ENCOUNTER — Other Ambulatory Visit: Payer: Self-pay | Admitting: Registered Nurse

## 2021-01-02 DIAGNOSIS — E785 Hyperlipidemia, unspecified: Secondary | ICD-10-CM

## 2021-01-04 ENCOUNTER — Other Ambulatory Visit: Payer: Self-pay | Admitting: Family Medicine

## 2021-01-04 ENCOUNTER — Other Ambulatory Visit: Payer: Self-pay | Admitting: Registered Nurse

## 2021-01-04 DIAGNOSIS — J309 Allergic rhinitis, unspecified: Secondary | ICD-10-CM

## 2021-01-04 DIAGNOSIS — I10 Essential (primary) hypertension: Secondary | ICD-10-CM

## 2021-01-14 ENCOUNTER — Other Ambulatory Visit: Payer: Self-pay | Admitting: Registered Nurse

## 2021-01-14 DIAGNOSIS — J45901 Unspecified asthma with (acute) exacerbation: Secondary | ICD-10-CM

## 2021-02-02 ENCOUNTER — Other Ambulatory Visit: Payer: Self-pay | Admitting: Family Medicine

## 2021-02-02 DIAGNOSIS — J309 Allergic rhinitis, unspecified: Secondary | ICD-10-CM

## 2021-02-03 ENCOUNTER — Ambulatory Visit: Payer: BC Managed Care – PPO | Admitting: Family Medicine

## 2021-02-03 VITALS — BP 122/80 | HR 93 | Temp 98.4°F | Resp 16 | Ht 75.0 in | Wt 253.4 lb

## 2021-02-03 DIAGNOSIS — J029 Acute pharyngitis, unspecified: Secondary | ICD-10-CM | POA: Diagnosis not present

## 2021-02-03 NOTE — Patient Instructions (Signed)
Exam is reassuring today.  As we discussed a virus can cause sore throat or possible strep throat, but you have been on antibiotic that typically will treat strep throat and based on your exam I do not think additional medication or antibiotic is needed today (the azithromycin is still at treatment level in your bloodstream for few more days).  You can try Cepacol over-the-counter if needed for sore throat, make sure to drink plenty of fluids, see other information below on sore throat care.  I expect your symptoms to be continue to improve over the next week to 10 days.  If not improving or any worsening please return for recheck.  Thank you for coming in today.   Sore Throat A sore throat is pain, burning, irritation, or scratchiness in the throat. When you have a sore throat, you may feel pain or tenderness in your throat when you swallow or talk. Many things can cause a sore throat, including: An infection. Seasonal allergies. Dryness in the air. Irritants, such as smoke or pollution. Radiation treatment for cancer. Gastroesophageal reflux disease (GERD). A tumor. A sore throat is often the first sign of another sickness. It may happen with other symptoms, such as coughing, sneezing, fever, and swollen neck glands. Most sore throats go away without medical treatment. Follow these instructions at home:   Medicines Take over-the-counter and prescription medicines only as told by your health care provider. Children often get sore throats. Do not give your child aspirin because of the association with Reye's syndrome. Use throat sprays to soothe your throat as told by your health care provider. Managing pain To help with pain, try: Sipping warm liquids, such as broth, herbal tea, or warm water. Eating or drinking cold or frozen liquids, such as frozen ice pops. Gargling with a mixture of salt and water 3-4 times a day or as needed. To make salt water, completely dissolve -1 tsp (3-6 g) of  salt in 1 cup (237 mL) of warm water. Sucking on hard candy or throat lozenges. Putting a cool-mist humidifier in your bedroom at night to moisten the air. Sitting in the bathroom with the door closed for 5-10 minutes while you run hot water in the shower. General instructions Do not use any products that contain nicotine or tobacco. These products include cigarettes, chewing tobacco, and vaping devices, such as e-cigarettes. If you need help quitting, ask your health care provider. Rest as needed. Drink enough fluid to keep your urine pale yellow. Wash your hands often with soap and water for at least 20 seconds. If soap and water are not available, use hand sanitizer. Contact a health care provider if: You have a fever for more than 2-3 days. You have symptoms that last for more than 2-3 days. Your throat does not get better within 7 days. You have a fever and your symptoms suddenly get worse. Get help right away if: You have difficulty breathing. You cannot swallow fluids, soft foods, or your saliva. You have increased swelling in your throat or neck. You have persistent nausea and vomiting. These symptoms may represent a serious problem that is an emergency. Do not wait to see if the symptoms will go away. Get medical help right away. Call your local emergency services (911 in the [place] Do not drive yourself to the hospital. Summary A sore throat is pain, burning, irritation, or scratchiness in the throat. Many things can cause a sore throat. Take over-the-counter medicines only as told by your health care  provider. Rest as needed. Drink enough fluid to keep your urine pale yellow. Contact a health care provider if your throat does not get better within 7 days. This information is not intended to replace advice given to you by your health care provider. Make sure you discuss any questions you have with your health care provider. Document Revised: 04/15/2020 Document Reviewed:  04/15/2020 Elsevier Patient Education  [place].

## 2021-02-03 NOTE — Progress Notes (Signed)
Subjective:  Patient ID: Joshua Levine, male    DOB: October 19, 1950  Age: 71 y.o. MRN: [id number]  CC:  Chief Complaint  Patient presents with   Sore Throat    Pt reports intermittent sore throat over 2 weeks. Not currently severe     HPI Joshua Levine presents for  Sore throat: Past 16 days. Initially had sore throat, felt feverish - 99.8. some constant cough, slight increase cough.  No covid or flu test. No medical care.  No difficulty swallowing. ? White spots in throat 1 week ago.  Still sore slightly - overall about the same - a little better compared to last week. Low grade temp - 99.5 at times. Feels feverish sometimes.  No known sick contacts, or exposure to flu/covid/strep.  No heartburn.  No neck swelling.   Tx: motrin. Took Zpak that was prescribed in November last week - 5th dose last night.      History Patient Active Problem List   Diagnosis Date Noted   Family history of factor V Leiden mutation 11/06/2019   Essential hypertension 12/10/2011   Hyperlipidemia 12/10/2011   Erectile dysfunction 12/10/2011   Past Medical History:  Diagnosis Date   Allergy    Arthritis    Basal cell carcinoma    Erectile dysfunction    Hyperlipidemia    Hypertension    Past Surgical History:  Procedure Laterality Date   LUMBAR DISC SURGERY     SPINE SURGERY     No Known Allergies Prior to Admission medications   Medication Sig Start Date End Date Taking? Authorizing Provider  albuterol (VENTOLIN HFA) 108 (90 Base) MCG/ACT inhaler INHALE 1-2 PUFFS INTO THE LUNGS EVERY 4 HOURS AS NEEDED FOR WHEEZING OR SHORTNESS OF BREATH. 01/15/21   Maximiano Coss, NP  Ascorbic Acid (VITAMIN C) 100 MG tablet Take 100 mg by mouth daily.     [provider]  fluticasone-salmeterol (ADVAIR HFA) 115-21 MCG/ACT inhaler Inhale 2 puffs into the lungs 2 (two) times daily. 12/01/20   Wendie Agreste, MD  glucosamine-chondroitin 500-400 MG tablet Take 1 tablet by mouth 3 (three) times  daily.    [provider]  lisinopril-hydrochlorothiazide (ZESTORETIC) 20-25 MG tablet TAKE 1 TABLET BY MOUTH EVERY DAY 01/05/21   Maximiano Coss, NP  loratadine (CLARITIN) 10 MG tablet TAKE 1 TABLET BY MOUTH EVERYDAY AT BEDTIME 02/03/21   Wendie Agreste, MD  pravastatin (PRAVACHOL) 80 MG tablet TAKE 1 TABLET BY MOUTH EVERY DAY 01/04/21   Maximiano Coss, NP   Social History   Socioeconomic History   Marital status: Married    Spouse name: Not on file   Number of children: Not on file   Years of education: Not on file   Highest education level: Not on file  Occupational History   Occupation: Automotive    Employer: [employer]  Tobacco Use   Smoking status: Former   Smokeless tobacco: Never  Substance and Sexual Activity   Alcohol use: No   Drug use: No   Sexual activity: Yes  Other Topics Concern   Not on file  Social History Narrative   Married. Education: [employer]. Exercise: Walk 2 times a week for 45 minutes.   Social Determinants of Health   Financial Resource Strain: Not on file  Food Insecurity: Not on file  Transportation Needs: Not on file  Physical Activity: Not on file  Stress: Not on file  Social Connections: Not on file  Intimate Partner Violence: Not on file  Review of Systems Per HPI  Objective:   Vitals:   02/03/21 1201  BP: 122/80  Pulse: 93  Resp: 16  Temp: 98.4 F (36.9 C)  TempSrc: Temporal  SpO2: 95%  Weight: 253 lb 6.4 oz (114.9 kg)  Height: 6\' 3"  (1.905 m)     Physical Exam Vitals reviewed.  Constitutional:      Appearance: He is well-developed.  HENT:     Head: Normocephalic and atraumatic.     Right Ear: Tympanic membrane, ear canal and external ear normal.     Left Ear: Tympanic membrane, ear canal and external ear normal.     Nose: No rhinorrhea.     Mouth/Throat:     Pharynx: Posterior oropharyngeal erythema (Slight erythema posterior oropharynx without ulcers, or exudate.  Uvula midline.  Clearing secretions  normally.) present. No oropharyngeal exudate.  Eyes:     Conjunctiva/sclera: Conjunctivae normal.     Pupils: Pupils are equal, round, and reactive to light.  Cardiovascular:     Rate and Rhythm: Normal rate and regular rhythm.     Heart sounds: Normal heart sounds. No murmur heard. Pulmonary:     Effort: Pulmonary effort is normal.     Breath sounds: Normal breath sounds. No wheezing, rhonchi or rales.  Abdominal:     Palpations: Abdomen is soft.     Tenderness: There is no abdominal tenderness.  Musculoskeletal:     Cervical back: Neck supple.  Lymphadenopathy:     Cervical: No cervical adenopathy.  Skin:    General: Skin is warm and dry.     Findings: No rash.  Neurological:     Mental Status: He is alert and oriented to person, place, and time.  Psychiatric:        Behavior: Behavior normal.       Assessment & Plan:  Joshua Levine is a 71 y.o. male . Sore throat Possible viral illness with persistent sore throat versus strep pharyngitis, partially treated with azithromycin.  Reassuring exam at present.  Symptomatic care discussed, no additional antibiotics at this time with RTC precautions given.  No orders of the defined types were placed in this encounter.  Patient Instructions  Exam is reassuring today.  As we discussed a virus can cause sore throat or possible strep throat, but you have been on antibiotic that typically will treat strep throat and based on your exam I do not think additional medication or antibiotic is needed today (the azithromycin is still at treatment level in your bloodstream for few more days).  You can try Cepacol over-the-counter if needed for sore throat, make sure to drink plenty of fluids, see other information below on sore throat care.  I expect your symptoms to be continue to improve over the next week to 10 days.  If not improving or any worsening please return for recheck.  Thank you for coming in today.   Sore Throat A sore throat is  pain, burning, irritation, or scratchiness in the throat. When you have a sore throat, you may feel pain or tenderness in your throat when you swallow or talk. Many things can cause a sore throat, including: An infection. Seasonal allergies. Dryness in the air. Irritants, such as smoke or pollution. Radiation treatment for cancer. Gastroesophageal reflux disease (GERD). A tumor. A sore throat is often the first sign of another sickness. It may happen with other symptoms, such as coughing, sneezing, fever, and swollen neck glands. Most sore throats go away without medical treatment. Follow  these instructions at home:   Medicines Take over-the-counter and prescription medicines only as told by your health care provider. Children often get sore throats. Do not give your child aspirin because of the association with Reye's syndrome. Use throat sprays to soothe your throat as told by your health care provider. Managing pain To help with pain, try: Sipping warm liquids, such as broth, herbal tea, or warm water. Eating or drinking cold or frozen liquids, such as frozen ice pops. Gargling with a mixture of salt and water 3-4 times a day or as needed. To make salt water, completely dissolve -1 tsp (3-6 g) of salt in 1 cup (237 mL) of warm water. Sucking on hard candy or throat lozenges. Putting a cool-mist humidifier in your bedroom at night to moisten the air. Sitting in the bathroom with the door closed for 5-10 minutes while you run hot water in the shower. General instructions Do not use any products that contain nicotine or tobacco. These products include cigarettes, chewing tobacco, and vaping devices, such as e-cigarettes. If you need help quitting, ask your health care provider. Rest as needed. Drink enough fluid to keep your urine pale yellow. Wash your hands often with soap and water for at least 20 seconds. If soap and water are not available, use hand sanitizer. Contact a health care  provider if: You have a fever for more than 2-3 days. You have symptoms that last for more than 2-3 days. Your throat does not get better within 7 days. You have a fever and your symptoms suddenly get worse. Get help right away if: You have difficulty breathing. You cannot swallow fluids, soft foods, or your saliva. You have increased swelling in your throat or neck. You have persistent nausea and vomiting. These symptoms may represent a serious problem that is an emergency. Do not wait to see if the symptoms will go away. Get medical help right away. Call your local emergency services (911 in the [place] Do not drive yourself to the hospital. Summary A sore throat is pain, burning, irritation, or scratchiness in the throat. Many things can cause a sore throat. Take over-the-counter medicines only as told by your health care provider. Rest as needed. Drink enough fluid to keep your urine pale yellow. Contact a health care provider if your throat does not get better within 7 days. This information is not intended to replace advice given to you by your health care provider. Make sure you discuss any questions you have with your health care provider. Document Revised: 04/15/2020 Document Reviewed: 04/15/2020 Elsevier Patient Education  2022 [place],   [name], MD [place] 02/03/21 9:53 PM

## 2021-02-15 ENCOUNTER — Ambulatory Visit: Payer: BC Managed Care – PPO | Admitting: Family Medicine

## 2021-02-15 VITALS — BP 130/76 | HR 74 | Temp 98.1°F | Resp 15 | Ht 75.0 in | Wt 251.4 lb

## 2021-02-15 DIAGNOSIS — R059 Cough, unspecified: Secondary | ICD-10-CM

## 2021-02-15 DIAGNOSIS — J4530 Mild persistent asthma, uncomplicated: Secondary | ICD-10-CM

## 2021-02-15 DIAGNOSIS — R12 Heartburn: Secondary | ICD-10-CM

## 2021-02-15 DIAGNOSIS — R0981 Nasal congestion: Secondary | ICD-10-CM | POA: Diagnosis not present

## 2021-02-15 DIAGNOSIS — Z23 Encounter for immunization: Secondary | ICD-10-CM

## 2021-02-15 MED ORDER — OMEPRAZOLE 20 MG PO CPDR: 20.0000 mg | DELAYED_RELEASE_CAPSULE | Freq: Every day | ORAL | 3 refills | Status: DC

## 2021-02-15 NOTE — Patient Instructions (Addendum)
Stop flonase.  Use Dymista in each nostril twice per day.  Increase the advair to 2 puffs twice per day.  Start omeprazole once per day. 30 mins before meal, take once per day.  See list of foods below to see if any consistent heartburn triggers.   Recheck in next month - sooner if worse.  Food Choices for Gastroesophageal Reflux Disease, Adult When you have gastroesophageal reflux disease (GERD), the foods you eat and your eating habits are very important. Choosing the right foods can help ease the discomfort of GERD. Consider working with a dietitian to help you make healthy food choices. What are tips for following this plan? Reading food labels Look for foods that are low in saturated fat. Foods that have less than 5% of daily value (DV) of fat and 0 g of trans fats may help with your symptoms. Cooking Cook foods using methods other than frying. This may include baking, steaming, grilling, or broiling. These are all methods that do not need a lot of fat for cooking. To add flavor, try to use herbs that are low in spice and acidity. Meal planning  Choose healthy foods that are low in fat, such as fruits, vegetables, whole grains, low-fat dairy products, lean meats, fish, and poultry. Eat frequent, small meals instead of three large meals each day. Eat your meals slowly, in a relaxed setting. Avoid bending over or lying down until 2-3 hours after eating. Limit high-fat foods such as fatty meats or fried foods. Limit your intake of fatty foods, such as oils, butter, and shortening. Avoid the following as told by your health care provider: Foods that cause symptoms. These may be different for different people. Keep a food diary to keep track of foods that cause symptoms. Alcohol. Drinking large amounts of liquid with meals. Eating meals during the 2-3 hours before bed. Lifestyle Maintain a healthy weight. Ask your health care provider what weight is healthy for you. If you need to lose  weight, work with your health care provider to do so safely. Exercise for at least 30 minutes on 5 or more days each week, or as told by your health care provider. Avoid wearing clothes that fit tightly around your waist and chest. Do not use any products that contain nicotine or tobacco. These products include cigarettes, chewing tobacco, and vaping devices, such as e-cigarettes. If you need help quitting, ask your health care provider. Sleep with the head of your bed raised. Use a wedge under the mattress or blocks under the bed frame to raise the head of the bed. Chew sugar-free gum after mealtimes. What foods should I eat? Eat a healthy, well-balanced diet of fruits, vegetables, whole grains, low-fat dairy products, lean meats, fish, and poultry. Each person is different. Foods that may trigger symptoms in one person may not trigger any symptoms in another person. Work with your health care provider to identify foods that are safe for you. The items listed above may not be a complete list of recommended foods and beverages. Contact a dietitian for more information.  What foods should I avoid? Limiting some of these foods may help manage the symptoms of GERD. Everyone is different. Consult a dietitian or your health care provider to help you identify the exact foods to avoid, if any. Fruits Any fruits prepared with added fat. Any fruits that cause symptoms. For some people this may include citrus fruits, such as oranges, grapefruit, pineapple, and lemons. Vegetables Deep-fried vegetables. [place] fries. Any vegetables  prepared with added fat. Any vegetables that cause symptoms. For some people, this may include tomatoes and tomato products, chili peppers, onions and garlic, and horseradish. Grains Pastries or quick breads with added fat. Meats and other proteins High-fat meats, such as fatty beef or pork, hot dogs, ribs, ham, sausage, salami, and bacon. Fried meat or protein, including fried  fish and fried chicken. Nuts and nut butters, in large amounts. Dairy Whole milk and chocolate milk. Sour cream. Cream. Ice cream. Cream cheese. Milkshakes. Fats and oils Butter. Margarine. Shortening. Ghee. Beverages Coffee and tea, with or without caffeine. Carbonated beverages. Sodas. Energy drinks. Fruit juice made with acidic fruits, such as orange or grapefruit. Tomato juice. Alcoholic drinks. Sweets and desserts Chocolate and cocoa. Donuts. Seasonings and condiments Pepper. Peppermint and spearmint. Added salt. Any condiments, herbs, or seasonings that cause symptoms. For some people, this may include curry, hot sauce, or vinegar-based salad dressings. The items listed above may not be a complete list of foods and beverages to avoid. Contact a dietitian for more information. Questions to ask your health care provider Diet and lifestyle changes are usually the first steps that are taken to manage symptoms of GERD. If diet and lifestyle changes do not improve your symptoms, talk with your health care provider about taking medicines. Where to find more information International [employer] for Gastrointestinal Disorders: aboutgerd.org Summary When you have gastroesophageal reflux disease (GERD), food and lifestyle choices may be very helpful in easing the discomfort of GERD. Eat frequent, small meals instead of three large meals each day. Eat your meals slowly, in a relaxed setting. Avoid bending over or lying down until 2-3 hours after eating. Limit high-fat foods such as fatty meats or fried foods. This information is not intended to replace advice given to you by your health care provider. Make sure you discuss any questions you have with your health care provider. Document Revised: 07/29/2019 Document Reviewed: 07/29/2019 Elsevier Patient Education  [place].

## 2021-02-15 NOTE — Progress Notes (Signed)
Subjective:  Patient ID: Joshua Levine, male    DOB: 02/24/1950  Age: 71 y.o. MRN: [id number]  CC:  Chief Complaint  Patient presents with   Asthma    Pt here for recheck has been better, reports has some congestion he feels is related to asthma   Sore Throat    Pt here last OV 1/4 for reflux, reports has had continued sxs, would like to discuss possible reflux again.     HPI Joshua Levine presents for   Cough, mild persistent asthma. Discussed in November.  Thought to be component of postinfectious cough along with postnasal drip.  Flonase was changed to Dymista.  Continued Advair (changed from [place] in October 2022), albuterol if needed, azithromycin if needed.  Chest x-ray without acute abnormalities on 11/30/2020.  Cough at times during the day. , coughing fits but better than in past.  Using advair 1 puff BID.  No fever, night sweats, weight loss, hemoptysis. No dyspnea, no recent wheeze or need for albuterol.  Chronic nasal congestion. Using flonase and new dymista - 1 spray in one nostril only of each.   Sore throat: Seen for acute visit January 4.  Sore throat at that time had recently taken the azithromycin 5 days prior.  Suspected viral illness versus strep pharyngitis treated with azithromycin.  Lungs were clear at that time. Still having some throat discomfort at times. Unknown time. Overall better. No fever. No exudate recently.  Week ago - episode of acid reflux that woke him up - cleared with cough. Large meal and some sweets night before.  No daytime heartburn typically.  No meds, or attempted treatments.        History Patient Active Problem List   Diagnosis Date Noted   Family history of factor V Leiden mutation 11/06/2019   Essential hypertension 12/10/2011   Hyperlipidemia 12/10/2011   Erectile dysfunction 12/10/2011   Past Medical History:  Diagnosis Date   Allergy    Arthritis    Basal cell carcinoma    Erectile dysfunction    Hyperlipidemia     Hypertension    Past Surgical History:  Procedure Laterality Date   LUMBAR DISC SURGERY     SPINE SURGERY     No Known Allergies Prior to Admission medications   Medication Sig Start Date End Date Taking? Authorizing Provider  albuterol (VENTOLIN HFA) 108 (90 Base) MCG/ACT inhaler INHALE 1-2 PUFFS INTO THE LUNGS EVERY 4 HOURS AS NEEDED FOR WHEEZING OR SHORTNESS OF BREATH. 01/15/21   Maximiano Coss, NP  Ascorbic Acid (VITAMIN C) 100 MG tablet Take 100 mg by mouth daily.     [provider]  fluticasone-salmeterol (ADVAIR HFA) 115-21 MCG/ACT inhaler Inhale 2 puffs into the lungs 2 (two) times daily. 12/01/20   Wendie Agreste, MD  glucosamine-chondroitin 500-400 MG tablet Take 1 tablet by mouth 3 (three) times daily.    [provider]  lisinopril-hydrochlorothiazide (ZESTORETIC) 20-25 MG tablet TAKE 1 TABLET BY MOUTH EVERY DAY 01/05/21   Maximiano Coss, NP  loratadine (CLARITIN) 10 MG tablet TAKE 1 TABLET BY MOUTH EVERYDAY AT BEDTIME 02/03/21   Wendie Agreste, MD  pravastatin (PRAVACHOL) 80 MG tablet TAKE 1 TABLET BY MOUTH EVERY DAY 01/04/21   Maximiano Coss, NP   Social History   Socioeconomic History   Marital status: Married    Spouse name: Not on file   Number of children: Not on file   Years of education: Not on file   Highest education level:  Not on file  Occupational History   Occupation: Public librarian: [employer]  Tobacco Use   Smoking status: Former   Smokeless tobacco: Never  Substance and Sexual Activity   Alcohol use: No   Drug use: No   Sexual activity: Yes  Other Topics Concern   Not on file  Social History Narrative   Married. Education: [employer]. Exercise: Walk 2 times a week for 45 minutes.   Social Determinants of Health   Financial Resource Strain: Not on file  Food Insecurity: Not on file  Transportation Needs: Not on file  Physical Activity: Not on file  Stress: Not on file  Social Connections: Not on file  Intimate  Partner Violence: Not on file    Review of Systems Per HPI.   Objective:   Vitals:   02/15/21 1451  BP: 130/76  Pulse: 74  Resp: 15  Temp: 98.1 F (36.7 C)  TempSrc: Temporal  SpO2: 97%  Weight: 251 lb 6.4 oz (114 kg)  Height: 6\' 3"  (1.905 m)     Physical Exam Vitals reviewed.  Constitutional:      Appearance: He is well-developed.  HENT:     Head: Normocephalic and atraumatic.     Right Ear: Tympanic membrane, ear canal and external ear normal.     Left Ear: Tympanic membrane, ear canal and external ear normal.     Nose: No rhinorrhea.     Mouth/Throat:     Mouth: Mucous membranes are moist.     Pharynx: Oropharynx is clear. No oropharyngeal exudate or posterior oropharyngeal erythema.     Comments: Slightly pendulous uvula without widening/appreciable edema.  Posterior oropharynx without significant erythema or exudate, no vesicles.  Clearing secretions normally without stridor Eyes:     Conjunctiva/sclera: Conjunctivae normal.     Pupils: Pupils are equal, round, and reactive to light.  Cardiovascular:     Rate and Rhythm: Normal rate and regular rhythm.     Heart sounds: Normal heart sounds. No murmur heard. Pulmonary:     Effort: Pulmonary effort is normal. No respiratory distress.     Breath sounds: Normal breath sounds. No stridor. No wheezing, rhonchi or rales.  Abdominal:     Palpations: Abdomen is soft.     Tenderness: There is no abdominal tenderness.  Musculoskeletal:     Cervical back: Neck supple.  Lymphadenopathy:     Cervical: No cervical adenopathy.  Skin:    General: Skin is warm and dry.     Findings: No rash.  Neurological:     Mental Status: He is alert and oriented to person, place, and time.  Psychiatric:        Behavior: Behavior normal.     35 minutes spent during visit, including chart review, prior note review and plan review with nasal spray and inhalers,, discussion with patient,, counseling and assimilation of information,  exam, discussion of plan, and chart completion.    Assessment & Plan:  Joshua Levine is a 71 y.o. male . Cough, unspecified type - Plan: omeprazole (PRILOSEC) 20 MG capsule  Mild persistent asthma without complication  Chronic nasal congestion  Need for shingles vaccine - Plan: Varicella-zoster vaccine IM  Heartburn - Plan: omeprazole (PRILOSEC) 20 MG capsule  Continued cough, sore throat.  Clarified some medications.  May have component of upper airway cough syndrome with postnasal drainage/sinus congestion as well as possible silent reflux with 1 episode of heartburn as a new symptom as above.  Persistent cough/cough throughout  the day may be uncontrolled asthma versus reflux.  -Increase Advair to 2 puffs twice daily.  Dymista 1 spray in each nostril every 12 hours, stop additional Flonase  -Trigger avoidance for heartburn with handout given, start omeprazole  -Recheck 1 month.  RTC precautions if worse sooner.  Previous imaging reassuring but consider repeat imaging if persistent cough versus pulmonary eval.   Meds ordered this encounter  Medications   omeprazole (PRILOSEC) 20 MG capsule    Sig: Take 1 capsule (20 mg total) by mouth daily.    Dispense:  30 capsule    Refill:  3   Patient Instructions  Stop flonase.  Use Dymista in each nostril twice per day.  Increase the advair to 2 puffs twice per day.  Start omeprazole once per day. 30 mins before meal, take once per day.  See list of foods below to see if any consistent heartburn triggers.   Recheck in next month - sooner if worse.  Food Choices for Gastroesophageal Reflux Disease, Adult When you have gastroesophageal reflux disease (GERD), the foods you eat and your eating habits are very important. Choosing the right foods can help ease the discomfort of GERD. Consider working with a dietitian to help you make healthy food choices. What are tips for following this plan? Reading food labels Look for foods that are low  in saturated fat. Foods that have less than 5% of daily value (DV) of fat and 0 g of trans fats may help with your symptoms. Cooking Cook foods using methods other than frying. This may include baking, steaming, grilling, or broiling. These are all methods that do not need a lot of fat for cooking. To add flavor, try to use herbs that are low in spice and acidity. Meal planning  Choose healthy foods that are low in fat, such as fruits, vegetables, whole grains, low-fat dairy products, lean meats, fish, and poultry. Eat frequent, small meals instead of three large meals each day. Eat your meals slowly, in a relaxed setting. Avoid bending over or lying down until 2-3 hours after eating. Limit high-fat foods such as fatty meats or fried foods. Limit your intake of fatty foods, such as oils, butter, and shortening. Avoid the following as told by your health care provider: Foods that cause symptoms. These may be different for different people. Keep a food diary to keep track of foods that cause symptoms. Alcohol. Drinking large amounts of liquid with meals. Eating meals during the 2-3 hours before bed. Lifestyle Maintain a healthy weight. Ask your health care provider what weight is healthy for you. If you need to lose weight, work with your health care provider to do so safely. Exercise for at least 30 minutes on 5 or more days each week, or as told by your health care provider. Avoid wearing clothes that fit tightly around your waist and chest. Do not use any products that contain nicotine or tobacco. These products include cigarettes, chewing tobacco, and vaping devices, such as e-cigarettes. If you need help quitting, ask your health care provider. Sleep with the head of your bed raised. Use a wedge under the mattress or blocks under the bed frame to raise the head of the bed. Chew sugar-free gum after mealtimes. What foods should I eat? Eat a healthy, well-balanced diet of fruits, vegetables,  whole grains, low-fat dairy products, lean meats, fish, and poultry. Each person is different. Foods that may trigger symptoms in one person may not trigger any symptoms in another  person. Work with your health care provider to identify foods that are safe for you. The items listed above may not be a complete list of recommended foods and beverages. Contact a dietitian for more information.  What foods should I avoid? Limiting some of these foods may help manage the symptoms of GERD. Everyone is different. Consult a dietitian or your health care provider to help you identify the exact foods to avoid, if any. Fruits Any fruits prepared with added fat. Any fruits that cause symptoms. For some people this may include citrus fruits, such as oranges, grapefruit, pineapple, and lemons. Vegetables Deep-fried vegetables. [place] fries. Any vegetables prepared with added fat. Any vegetables that cause symptoms. For some people, this may include tomatoes and tomato products, chili peppers, onions and garlic, and horseradish. Grains Pastries or quick breads with added fat. Meats and other proteins High-fat meats, such as fatty beef or pork, hot dogs, ribs, ham, sausage, salami, and bacon. Fried meat or protein, including fried fish and fried chicken. Nuts and nut butters, in large amounts. Dairy Whole milk and chocolate milk. Sour cream. Cream. Ice cream. Cream cheese. Milkshakes. Fats and oils Butter. Margarine. Shortening. Ghee. Beverages Coffee and tea, with or without caffeine. Carbonated beverages. Sodas. Energy drinks. Fruit juice made with acidic fruits, such as orange or grapefruit. Tomato juice. Alcoholic drinks. Sweets and desserts Chocolate and cocoa. Donuts. Seasonings and condiments Pepper. Peppermint and spearmint. Added salt. Any condiments, herbs, or seasonings that cause symptoms. For some people, this may include curry, hot sauce, or vinegar-based salad dressings. The items listed above  may not be a complete list of foods and beverages to avoid. Contact a dietitian for more information. Questions to ask your health care provider Diet and lifestyle changes are usually the first steps that are taken to manage symptoms of GERD. If diet and lifestyle changes do not improve your symptoms, talk with your health care provider about taking medicines. Where to find more information International Foundation for Gastrointestinal Disorders: aboutgerd.org Summary When you have gastroesophageal reflux disease (GERD), food and lifestyle choices may be very helpful in easing the discomfort of GERD. Eat frequent, small meals instead of three large meals each day. Eat your meals slowly, in a relaxed setting. Avoid bending over or lying down until 2-3 hours after eating. Limit high-fat foods such as fatty meats or fried foods. This information is not intended to replace advice given to you by your health care provider. Make sure you discuss any questions you have with your health care provider. Document Revised: 07/29/2019 Document Reviewed: 07/29/2019 Elsevier Patient Education  2022 [place],   [name], MD [place], [facility] 02/15/21 3:11 PM

## 2021-03-06 ENCOUNTER — Other Ambulatory Visit: Payer: Self-pay | Admitting: Family Medicine

## 2021-03-18 ENCOUNTER — Encounter: Payer: Self-pay | Admitting: Family Medicine

## 2021-03-18 ENCOUNTER — Ambulatory Visit: Payer: BC Managed Care – PPO | Admitting: Family Medicine

## 2021-03-18 VITALS — BP 130/82 | HR 73 | Temp 98.2°F | Resp 16 | Ht 75.0 in | Wt 249.2 lb

## 2021-03-18 DIAGNOSIS — J4530 Mild persistent asthma, uncomplicated: Secondary | ICD-10-CM

## 2021-03-18 DIAGNOSIS — R0981 Nasal congestion: Secondary | ICD-10-CM

## 2021-03-18 DIAGNOSIS — B349 Viral infection, unspecified: Secondary | ICD-10-CM | POA: Diagnosis not present

## 2021-03-18 DIAGNOSIS — R059 Cough, unspecified: Secondary | ICD-10-CM | POA: Diagnosis not present

## 2021-03-18 NOTE — Progress Notes (Signed)
Subjective:  Patient ID: Joshua Levine, male    DOB: [birth year]  Age: 71 y.o. MRN: [id number]  CC:  Chief Complaint  Patient presents with   Cough    Still has some cough he was sick last week   Nasal Congestion    He is having some sinus drainage from being sick last week and fever for four days, patient feels that he is feeling better today     HPI Joshua Levine presents for   Cough, nasal congestion: Discussed January 16 visit.  Possible component of GERD cough syndrome, asthma, reflux.  Advair was increased to 2 puffs twice daily.  Dymista 1 spray each nostril every 12 hours and stop the additional Flonase.  Trigger avoidance for heartburn and started omeprazole. Was improving on above regimen. Still minimal cough, clearing throat. No wheezing.   Had new illness starting 2/4 - fever, head and chest congestion, improved after a day and half. fever for a few days - 101.2. Jury duty 2 days prior. Did not covid or flu test.  Symptoms resolved - back to baseline now.  No recent need for albuterol.   Minimal cough now. Cough and sinus issues for years. Has seen allergist off and on over the years. Last allergist visit few years ago - declines repeat eval at this time.   History Patient Active Problem List   Diagnosis Date Noted   Family history of factor V Leiden mutation 11/06/2019   Essential hypertension 12/10/2011   Hyperlipidemia 12/10/2011   Erectile dysfunction 12/10/2011   Past Medical History:  Diagnosis Date   Allergy    Arthritis    Basal cell carcinoma    Erectile dysfunction    Hyperlipidemia    Hypertension    Past Surgical History:  Procedure Laterality Date   LUMBAR DISC SURGERY     SPINE SURGERY     No Known Allergies Prior to Admission medications   Medication Sig Start Date End Date Taking? Authorizing Provider  albuterol (VENTOLIN HFA) 108 (90 Base) MCG/ACT inhaler INHALE 1-2 PUFFS INTO THE LUNGS EVERY 4 HOURS AS NEEDED FOR WHEEZING OR SHORTNESS  OF BREATH. 01/15/21  Yes Maximiano Coss, NP  Ascorbic Acid (VITAMIN C) 100 MG tablet Take 100 mg by mouth daily.    Yes [provider]  Azelastine-Fluticasone 137-50 MCG/ACT SUSP PLACE 1 SPRAY INTO THE NOSE EVERY 12 (TWELVE) HOURS. 03/08/21  Yes Wendie Agreste, MD  fluticasone-salmeterol (ADVAIR HFA) 115-21 MCG/ACT inhaler Inhale 2 puffs into the lungs 2 (two) times daily. 12/01/20  Yes Wendie Agreste, MD  glucosamine-chondroitin 500-400 MG tablet Take 1 tablet by mouth 3 (three) times daily.   Yes [provider]  lisinopril-hydrochlorothiazide (ZESTORETIC) 20-25 MG tablet TAKE 1 TABLET BY MOUTH EVERY DAY 01/05/21  Yes Maximiano Coss, NP  loratadine (CLARITIN) 10 MG tablet TAKE 1 TABLET BY MOUTH EVERYDAY AT BEDTIME 02/03/21  Yes Wendie Agreste, MD  omeprazole (PRILOSEC) 20 MG capsule Take 1 capsule (20 mg total) by mouth daily. 02/15/21  Yes Wendie Agreste, MD  pravastatin (PRAVACHOL) 80 MG tablet TAKE 1 TABLET BY MOUTH EVERY DAY 01/04/21  Yes Maximiano Coss, NP   Social History   Socioeconomic History   Marital status: Married    Spouse name: Not on file   Number of children: Not on file   Years of education: Not on file   Highest education level: Not on file  Occupational History   Occupation: Public librarian: [employer]  Tobacco Use   Smoking status: Former   Smokeless tobacco: Never  Substance and Sexual Activity   Alcohol use: No   Drug use: No   Sexual activity: Yes  Other Topics Concern   Not on file  Social History Narrative   Married. Education: [employer]. Exercise: Walk 2 times a week for 45 minutes.   Social Determinants of Health   Financial Resource Strain: Not on file  Food Insecurity: Not on file  Transportation Needs: Not on file  Physical Activity: Not on file  Stress: Not on file  Social Connections: Not on file  Intimate Partner Violence: Not on file    Review of Systems Per HPI  Objective:   Vitals:   03/18/21 1539   BP: 130/82  Pulse: 73  Resp: 16  Temp: 98.2 F (36.8 C)  TempSrc: Temporal  SpO2: 95%  Weight: 249 lb 4 oz (113.1 kg)  Height: 6\' 3"  (1.905 m)     Physical Exam Vitals reviewed.  Constitutional:      Appearance: He is well-developed.  HENT:     Head: Normocephalic and atraumatic.     Nose: No rhinorrhea.     Comments: Sinuses nontender.     Mouth/Throat:     Pharynx: No oropharyngeal exudate or posterior oropharyngeal erythema.  Eyes:     Conjunctiva/sclera: Conjunctivae normal.     Pupils: Pupils are equal, round, and reactive to light.  Cardiovascular:     Rate and Rhythm: Normal rate and regular rhythm.     Heart sounds: Normal heart sounds. No murmur heard. Pulmonary:     Effort: Pulmonary effort is normal.     Breath sounds: Normal breath sounds. No wheezing, rhonchi or rales.  Abdominal:     Palpations: Abdomen is soft.     Tenderness: There is no abdominal tenderness.  Musculoskeletal:     Cervical back: Neck supple.  Lymphadenopathy:     Cervical: No cervical adenopathy.  Skin:    General: Skin is warm and dry.     Findings: No rash.  Neurological:     Mental Status: He is alert and oriented to person, place, and time.  Psychiatric:        Behavior: Behavior normal.       Assessment & Plan:  Joshua Levine is a 71 y.o. male . Mild persistent asthma without complication Cough, unspecified type Chronic nasal congestion -Overall stable.  Plan continue same regimen.  Cough is overall improved.  Option to follow-up with allergist if not continuing to improve/resolve.  Continue current allergy meds.  Viral illness  -Recent viral illness with improvement.  Reassuring exam, RTC precautions.  No orders of the defined types were placed in this encounter.  Patient Instructions  Let me knwo if you would like to meet with allergist again. No change in meds today.     Signed,   [name], MD [place] 03/18/21 4:39 PM

## 2021-03-18 NOTE — Patient Instructions (Signed)
Let me knwo if you would like to meet with allergist again. No change in meds today.

## 2021-03-19 ENCOUNTER — Encounter: Payer: Self-pay | Admitting: Family Medicine

## 2021-05-05 ENCOUNTER — Other Ambulatory Visit: Payer: Self-pay | Admitting: Family Medicine

## 2021-05-14 ENCOUNTER — Other Ambulatory Visit: Payer: Self-pay | Admitting: Family Medicine

## 2021-05-14 DIAGNOSIS — R059 Cough, unspecified: Secondary | ICD-10-CM

## 2021-05-14 DIAGNOSIS — R12 Heartburn: Secondary | ICD-10-CM

## 2021-05-23 ENCOUNTER — Other Ambulatory Visit: Payer: Self-pay | Admitting: Family Medicine

## 2021-06-02 DIAGNOSIS — R229 Localized swelling, mass and lump, unspecified: Secondary | ICD-10-CM | POA: Diagnosis not present

## 2021-06-02 DIAGNOSIS — Z85828 Personal history of other malignant neoplasm of skin: Secondary | ICD-10-CM | POA: Diagnosis not present

## 2021-06-02 DIAGNOSIS — D1801 Hemangioma of skin and subcutaneous tissue: Secondary | ICD-10-CM | POA: Diagnosis not present

## 2021-06-02 DIAGNOSIS — Z08 Encounter for follow-up examination after completed treatment for malignant neoplasm: Secondary | ICD-10-CM | POA: Diagnosis not present

## 2021-06-16 ENCOUNTER — Ambulatory Visit (INDEPENDENT_AMBULATORY_CARE_PROVIDER_SITE_OTHER): Payer: BC Managed Care – PPO | Admitting: Family Medicine

## 2021-06-16 ENCOUNTER — Other Ambulatory Visit: Payer: Self-pay

## 2021-06-16 VITALS — BP 146/70 | HR 80 | Temp 98.3°F | Resp 18 | Ht 75.0 in | Wt 246.6 lb

## 2021-06-16 DIAGNOSIS — Z5321 Procedure and treatment not carried out due to patient leaving prior to being seen by health care provider: Secondary | ICD-10-CM

## 2021-06-16 NOTE — Patient Instructions (Signed)
° ° ° °  If you have lab work done today you will be contacted with your lab results within the next 2 weeks.  If you have not heard from us then please contact us. The fastest way to get your results is to register for My Chart. ° ° °IF you received an x-ray today, you will receive an invoice from [facility]. Please contact [facility] at [phone] with questions or concerns regarding your invoice.  ° °IF you received labwork today, you will receive an invoice from [facility]. Please contact [facility] at [phone] with questions or concerns regarding your invoice.  ° °Our billing staff will not be able to assist you with questions regarding bills from these companies. ° °You will be contacted with the lab results as soon as they are available. The fastest way to get your results is to activate your My Chart account. Instructions are located on the last page of this paperwork. If you have not heard from us regarding the results in 2 weeks, please contact this office. °  ° ° ° °

## 2021-06-16 NOTE — Progress Notes (Signed)
Patient had other obligations at 530, had to leave and reschedule due to wait.  I called and apologized for his wait time today.  He plans to follow-up this Monday at 9:00.  Appreciated the phone call. ?

## 2021-06-28 ENCOUNTER — Other Ambulatory Visit: Payer: Self-pay | Admitting: Family Medicine

## 2021-06-28 DIAGNOSIS — J309 Allergic rhinitis, unspecified: Secondary | ICD-10-CM

## 2021-07-01 ENCOUNTER — Ambulatory Visit: Payer: BC Managed Care – PPO | Admitting: Family Medicine

## 2021-07-01 ENCOUNTER — Encounter: Payer: Self-pay | Admitting: Family Medicine

## 2021-07-01 VITALS — BP 132/86 | HR 77 | Temp 98.5°F | Resp 16 | Ht 75.0 in | Wt 247.2 lb

## 2021-07-01 DIAGNOSIS — J4530 Mild persistent asthma, uncomplicated: Secondary | ICD-10-CM

## 2021-07-01 DIAGNOSIS — E785 Hyperlipidemia, unspecified: Secondary | ICD-10-CM

## 2021-07-01 DIAGNOSIS — J309 Allergic rhinitis, unspecified: Secondary | ICD-10-CM

## 2021-07-01 DIAGNOSIS — R0609 Other forms of dyspnea: Secondary | ICD-10-CM | POA: Diagnosis not present

## 2021-07-01 DIAGNOSIS — I10 Essential (primary) hypertension: Secondary | ICD-10-CM

## 2021-07-01 DIAGNOSIS — R059 Cough, unspecified: Secondary | ICD-10-CM

## 2021-07-01 MED ORDER — PRAVASTATIN SODIUM 80 MG PO TABS: 80.0000 mg | ORAL_TABLET | Freq: Every day | ORAL | 1 refills | Status: DC

## 2021-07-01 MED ORDER — LISINOPRIL-HYDROCHLOROTHIAZIDE 20-25 MG PO TABS: 1.0000 | ORAL_TABLET | Freq: Every day | ORAL | 1 refills | Status: DC

## 2021-07-01 MED ORDER — AZELASTINE-FLUTICASONE 137-50 MCG/ACT NA SUSP: 1.0000 | Freq: Two times a day (BID) | NASAL | 3 refills | Status: DC

## 2021-07-01 MED ORDER — FLUTICASONE-SALMETEROL 115-21 MCG/ACT IN AERO: 2.0000 | INHALATION_SPRAY | Freq: Two times a day (BID) | RESPIRATORY_TRACT | 12 refills | Status: DC

## 2021-07-01 NOTE — Patient Instructions (Addendum)
I would recommend meeting with pulmonary or allergist if cough becomes more frequent.  Continue allergy meds and inhaler same doses for now.  No med changes for now. If cholesterol elevated, we can repeat test with lab visit if needed.

## 2021-07-01 NOTE — Progress Notes (Signed)
Subjective:  Patient ID: Joshua Levine, male    DOB: 1950-04-02  Age: 71 y.o. MRN: [id number]  CC:  Chief Complaint  Patient presents with   Cough    Pt has a cough that is potentially contributed to by either asthma or GERD pt was advised to follow up with allergist, did not see allergist    Hypertension    Pt in need of refill Lisinopril HCTZ doing well, no physical sxs, no concerns     HPI Joshua Levine presents for   Cough: Discussed at previous visits earlier this year.  Possible component of GERD, upper airway cough syndrome, asthma, reflux.  Advair was increased, Dymista nasal spray prescribed in place of Flonase.  Trigger avoidance for heartburn and started omeprazole.  Improved with that regimen with minimal cough at his February visit.  Off-and-on sinus issues and cough for years.  Previous allergist visit.  Cough is better.pretty much gone. Felt like time to recover from February illness. Using advair 2puffs BID, claritin once per day, dymista nasal spray.  Off omeprazole - no change in symptoms.    Hypertension: Lisinopril HCTZ 20/25 mg daily - no side effects.  Home readings: none.  BP Readings from Last 3 Encounters:  07/01/21 132/86  06/16/21 (!) 146/70  03/18/21 130/82   Lab Results  Component Value Date   CREATININE 1.05 11/30/2020   Hyperlipidemia: Pravastatin 80 mg daily, no new myalgias, ate 3 hrs ago.  Lab Results  Component Value Date   CHOL 159 11/30/2020   HDL 33.40 (L) 11/30/2020   LDLCALC 75 11/06/2019   LDLDIRECT 104.0 11/30/2020   TRIG 290.0 (H) 11/30/2020   CHOLHDL 5 11/30/2020   Lab Results  Component Value Date   ALT 50 11/30/2020   AST 31 11/30/2020   ALKPHOS 77 11/30/2020   BILITOT 0.5 11/30/2020         History Patient Active Problem List   Diagnosis Date Noted   Family history of factor V Leiden mutation 11/06/2019   Essential hypertension 12/10/2011   Hyperlipidemia 12/10/2011   Erectile dysfunction 12/10/2011    Past Medical History:  Diagnosis Date   Allergy    Arthritis    Basal cell carcinoma    Erectile dysfunction    Hyperlipidemia    Hypertension    Past Surgical History:  Procedure Laterality Date   LUMBAR DISC SURGERY     SPINE SURGERY     No Known Allergies Prior to Admission medications   Medication Sig Start Date End Date Taking? Authorizing Provider  albuterol (VENTOLIN HFA) 108 (90 Base) MCG/ACT inhaler INHALE 1-2 PUFFS INTO THE LUNGS EVERY 4 HOURS AS NEEDED FOR WHEEZING OR SHORTNESS OF BREATH. 01/15/21  Yes Maximiano Coss, NP  Ascorbic Acid (VITAMIN C) 100 MG tablet Take 100 mg by mouth daily.    Yes [provider]  Azelastine-Fluticasone 137-50 MCG/ACT SUSP PLACE 1 SPRAY INTO THE NOSE EVERY 12 (TWELVE) HOURS. 05/05/21  Yes Wendie Agreste, MD  fluticasone-salmeterol (ADVAIR HFA) 115-21 MCG/ACT inhaler Inhale 2 puffs into the lungs 2 (two) times daily. 12/01/20  Yes Wendie Agreste, MD  glucosamine-chondroitin 500-400 MG tablet Take 1 tablet by mouth 3 (three) times daily.   Yes [provider]  lisinopril-hydrochlorothiazide (ZESTORETIC) 20-25 MG tablet TAKE 1 TABLET BY MOUTH EVERY DAY 01/05/21  Yes Maximiano Coss, NP  loratadine (CLARITIN) 10 MG tablet TAKE 1 TABLET BY MOUTH EVERYDAY AT BEDTIME 06/30/21  Yes Wendie Agreste, MD  pravastatin (PRAVACHOL) 80 MG  tablet TAKE 1 TABLET BY MOUTH EVERY DAY 01/04/21  Yes Maximiano Coss, NP  omeprazole (PRILOSEC) 20 MG capsule TAKE 1 CAPSULE BY MOUTH EVERY DAY Patient not taking: Reported on 07/01/2021 05/14/21   Wendie Agreste, MD   Social History   Socioeconomic History   Marital status: Married    Spouse name: Not on file   Number of children: Not on file   Years of education: Not on file   Highest education level: Not on file  Occupational History   Occupation: Automotive    Employer: [employer]  Tobacco Use   Smoking status: Former   Smokeless tobacco: Never  Substance and Sexual Activity   Alcohol  use: No   Drug use: No   Sexual activity: Yes  Other Topics Concern   Not on file  Social History Narrative   Married. Education: [employer]. Exercise: Walk 2 times a week for 45 minutes.   Social Determinants of Health   Financial Resource Strain: Not on file  Food Insecurity: Not on file  Transportation Needs: Not on file  Physical Activity: Not on file  Stress: Not on file  Social Connections: Not on file  Intimate Partner Violence: Not on file    Review of Systems  Constitutional:  Negative for fatigue and unexpected weight change.  Eyes:  Negative for visual disturbance.  Respiratory:  Negative for cough, chest tightness and shortness of breath.   Cardiovascular:  Negative for chest pain, palpitations and leg swelling.  Gastrointestinal:  Negative for abdominal pain and blood in stool.  Neurological:  Negative for dizziness, light-headedness and headaches.    Objective:   Vitals:   07/01/21 1427  BP: 132/86  Pulse: 77  Resp: 16  Temp: 98.5 F (36.9 C)  TempSrc: Temporal  SpO2: 96%  Weight: 247 lb 3.2 oz (112.1 kg)  Height: '6\' 3"'$  (1.905 m)     Physical Exam Vitals reviewed.  Constitutional:      Appearance: He is well-developed.  HENT:     Head: Normocephalic and atraumatic.  Neck:     Vascular: No carotid bruit or JVD.  Cardiovascular:     Rate and Rhythm: Normal rate and regular rhythm.     Heart sounds: Normal heart sounds. No murmur heard. Pulmonary:     Effort: Pulmonary effort is normal.     Breath sounds: Normal breath sounds. No rales.  Musculoskeletal:     Right lower leg: No edema.     Left lower leg: No edema.  Skin:    General: Skin is warm and dry.  Neurological:     Mental Status: He is alert and oriented to person, place, and time.  Psychiatric:        Mood and Affect: Mood normal.       Assessment & Plan:  Joshua Levine is a 71 y.o. male . Hyperlipidemia, unspecified hyperlipidemia type - Plan: Lipid panel, Comprehensive  metabolic panel, pravastatin (PRAVACHOL) 80 MG tablet  -  Stable, tolerating current regimen. Medications refilled. Labs pending as above.   Cough, unspecified type - Plan: fluticasone-salmeterol (ADVAIR HFA) 115-21 MCG/ACT inhaler DOE (dyspnea on exertion) - Plan: fluticasone-salmeterol (ADVAIR HFA) 115-21 MCG/ACT inhaler Mild persistent asthma without complication - Plan: fluticasone-salmeterol (ADVAIR HFA) 115-21 MCG/ACT inhaler  -Cough improved, likely upper airway cough with postnasal drip as well as asthma.  No significant change with PPI, discontinued.  Continue Advair, Dymista nasal spray, RTC precautions or meet with pulmonary/allergist if cough recurs.  Essential hypertension - Plan:  lisinopril-hydrochlorothiazide (ZESTORETIC) 20-25 MG tablet, Comprehensive metabolic panel  -Stable, continue same regimen Labs ordered.  Allergic rhinitis, unspecified seasonality, unspecified trigger - Plan: Azelastine-Fluticasone 137-50 MCG/ACT SUSP  -As above, continue Dymista.  Meds ordered this encounter  Medications   fluticasone-salmeterol (ADVAIR HFA) 115-21 MCG/ACT inhaler    Sig: Inhale 2 puffs into the lungs 2 (two) times daily.    Dispense:  1 each    Refill:  12   lisinopril-hydrochlorothiazide (ZESTORETIC) 20-25 MG tablet    Sig: Take 1 tablet by mouth daily.    Dispense:  90 tablet    Refill:  1   pravastatin (PRAVACHOL) 80 MG tablet    Sig: Take 1 tablet (80 mg total) by mouth daily.    Dispense:  90 tablet    Refill:  1   Azelastine-Fluticasone 137-50 MCG/ACT SUSP    Sig: Place 1 spray into the nose every 12 (twelve) hours.    Dispense:  23 g    Refill:  3   Patient Instructions  I would recommend meeting with pulmonary or allergist if cough becomes more frequent.  Continue allergy meds and inhaler same doses for now.  No med changes for now. If cholesterol elevated, we can repeat test with lab visit if needed.     Signed,   [name], MD [place] 07/01/21 3:38 PM

## 2021-07-02 LAB — COMPREHENSIVE METABOLIC PANEL
ALT: 50 U/L (ref 0–53)
AST: 30 U/L (ref 0–37)
Albumin: 4.4 g/dL (ref 3.5–5.2)
Alkaline Phosphatase: 81 U/L (ref 39–117)
BUN: 20 mg/dL (ref 6–23)
CO2: 25 mEq/L (ref 19–32)
Calcium: 9.5 mg/dL (ref 8.4–10.5)
Chloride: 103 mEq/L (ref 96–112)
Creatinine, Ser: 1.02 mg/dL (ref 0.40–1.50)
GFR: 75.04 mL/min (ref >60.00)
Glucose, Bld: 97 mg/dL (ref 70–99)
Potassium: 4 mEq/L (ref 3.5–5.1)
Sodium: 138 mEq/L (ref 135–145)
Total Bilirubin: 0.3 mg/dL (ref 0.2–1.2)
Total Protein: 7 g/dL (ref 6.0–8.3)

## 2021-07-02 LAB — LIPID PANEL
Cholesterol: 148 mg/dL (ref 0–200)
HDL: 30.9 mg/dL — ABNORMAL LOW (ref >39.00)
Total CHOL/HDL Ratio: 5
Triglycerides: 444 mg/dL — ABNORMAL HIGH (ref 0.0–149.0)

## 2021-07-02 LAB — LDL CHOLESTEROL, DIRECT: Direct LDL: 88 mg/dL

## 2021-09-10 ENCOUNTER — Telehealth: Payer: Self-pay | Admitting: Family Medicine

## 2021-09-10 NOTE — Telephone Encounter (Signed)
I took care of it 

## 2021-09-10 NOTE — Telephone Encounter (Signed)
Hey Joshua Levine just wondering were there something that needed to be done for this patient.

## 2021-09-25 ENCOUNTER — Other Ambulatory Visit: Payer: Self-pay | Admitting: Family Medicine

## 2021-09-25 DIAGNOSIS — J309 Allergic rhinitis, unspecified: Secondary | ICD-10-CM

## 2021-10-23 ENCOUNTER — Other Ambulatory Visit: Payer: Self-pay | Admitting: Family Medicine

## 2021-10-23 DIAGNOSIS — J309 Allergic rhinitis, unspecified: Secondary | ICD-10-CM

## 2021-12-23 ENCOUNTER — Other Ambulatory Visit: Payer: Self-pay | Admitting: Family Medicine

## 2021-12-23 DIAGNOSIS — J309 Allergic rhinitis, unspecified: Secondary | ICD-10-CM

## 2021-12-30 ENCOUNTER — Other Ambulatory Visit: Payer: Self-pay | Admitting: Family Medicine

## 2021-12-30 DIAGNOSIS — E785 Hyperlipidemia, unspecified: Secondary | ICD-10-CM

## 2021-12-31 ENCOUNTER — Encounter: Payer: Self-pay | Admitting: Family Medicine

## 2021-12-31 ENCOUNTER — Ambulatory Visit (INDEPENDENT_AMBULATORY_CARE_PROVIDER_SITE_OTHER): Payer: BC Managed Care – PPO | Admitting: Family Medicine

## 2021-12-31 VITALS — BP 130/70 | HR 90 | Temp 98.9°F | Ht 75.0 in | Wt 247.6 lb

## 2021-12-31 DIAGNOSIS — R0609 Other forms of dyspnea: Secondary | ICD-10-CM

## 2021-12-31 DIAGNOSIS — J309 Allergic rhinitis, unspecified: Secondary | ICD-10-CM

## 2021-12-31 DIAGNOSIS — R0981 Nasal congestion: Secondary | ICD-10-CM | POA: Diagnosis not present

## 2021-12-31 DIAGNOSIS — Z125 Encounter for screening for malignant neoplasm of prostate: Secondary | ICD-10-CM

## 2021-12-31 DIAGNOSIS — E785 Hyperlipidemia, unspecified: Secondary | ICD-10-CM

## 2021-12-31 DIAGNOSIS — I10 Essential (primary) hypertension: Secondary | ICD-10-CM | POA: Diagnosis not present

## 2021-12-31 DIAGNOSIS — R059 Cough, unspecified: Secondary | ICD-10-CM

## 2021-12-31 DIAGNOSIS — Z0000 Encounter for general adult medical examination without abnormal findings: Secondary | ICD-10-CM

## 2021-12-31 DIAGNOSIS — R7303 Prediabetes: Secondary | ICD-10-CM

## 2021-12-31 DIAGNOSIS — J4530 Mild persistent asthma, uncomplicated: Secondary | ICD-10-CM

## 2021-12-31 MED ORDER — IPRATROPIUM BROMIDE 0.06 % NA SOLN: 1.0000 | Freq: Four times a day (QID) | NASAL | 5 refills | Status: DC

## 2021-12-31 MED ORDER — PRAVASTATIN SODIUM 80 MG PO TABS: 80.0000 mg | ORAL_TABLET | Freq: Every day | ORAL | 1 refills | Status: DC

## 2021-12-31 MED ORDER — LISINOPRIL-HYDROCHLOROTHIAZIDE 20-25 MG PO TABS: 1.0000 | ORAL_TABLET | Freq: Every day | ORAL | 1 refills | Status: DC

## 2021-12-31 MED ORDER — AZELASTINE-FLUTICASONE 137-50 MCG/ACT NA SUSP: 1.0000 | Freq: Two times a day (BID) | NASAL | 3 refills | Status: DC

## 2021-12-31 MED ORDER — LORATADINE 10 MG PO TABS: ORAL_TABLET | ORAL | 0 refills | Status: DC

## 2021-12-31 MED ORDER — FLUTICASONE-SALMETEROL 115-21 MCG/ACT IN AERO: 2.0000 | INHALATION_SPRAY | Freq: Two times a day (BID) | RESPIRATORY_TRACT | 12 refills | Status: DC

## 2021-12-31 NOTE — Progress Notes (Signed)
Subjective:  Patient ID: Joshua Levine, male    DOB: Nov 13, 1950  Age: 71 y.o. MRN: [id number]  CC:  Chief Complaint  Patient presents with   Annual Exam    Pt states all is well   Knee Pain    Pt jumped from a high table and is still in pain from that he has his MRI from that, it was August 1, pt had surgery 3 week in September for it     Sinus Problem    Pt states he has had sinus drainage, pt states he has had it for 10-14 days     HPI Joshua Levine presents for Annual Exam Med review, physical and acute concerns as above.  Hypertension: Lisinopril HCTZ 20/25 mg daily.  Home readings: none. No new side effects.  BP Readings from Last 3 Encounters:  12/31/21 130/70  07/01/21 132/86  06/16/21 (!) 146/70   Lab Results  Component Value Date   CREATININE 1.02 07/01/2021   Allergic rhinitis Treated with azelastine fluticasone, Claritin.  History of recurrent cough, possible upper airway cough syndrome, treated with allergy meds above, as well as Advair 2 puffs twice daily.  Tried PPI in the past without change in symptoms. Concern as above, recent sinus drainage, increase sinus drainage over the past 10 days or so. More than usual. No discolored nasal d/c. No fever/HA. Using usual meds. otherwise feels ok.  No wheeze, cough controlled. No dyspnea.   Prediabetes: No recent testing.  No sugar drinks. Weight stable. Off and on exercise - limited with knee issues.  Lab Results  Component Value Date   HGBA1C 6.4 11/30/2020     Hyperlipidemia: Pravastatin 80 mg daily. No new side effects, occasional leg cramps. Not daily. No changes. Notes at night - plans stretches and follow up if persistent.  Not fasting today.  Lab Results  Component Value Date   CHOL 148 07/01/2021   HDL 30.90 (L) 07/01/2021   LDLCALC 75 11/06/2019   LDLDIRECT 88.0 07/01/2021   TRIG (H) 07/01/2021    444.0 Triglyceride is over 400; calculations on Lipids are invalid.   CHOLHDL 5 07/01/2021    Lab Results  Component Value Date   ALT 50 07/01/2021   AST 30 07/01/2021   ALKPHOS 81 07/01/2021   BILITOT 0.3 07/01/2021     Knee pain Left knee injury in August, jumping down from elevated surface.  status post MRI then surgery. 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 Surgery in September. Completed PT last week. Improving.       12/31/2021    9:14 AM 07/01/2021    2:32 PM 06/16/2021    4:02 PM 03/18/2021    3:43 PM 02/15/2021    2:54 PM  Depression screen PHQ 2/9  Decreased Interest 0 0 0 0 0  Down, Depressed, Hopeless 0 0 0 0 0  PHQ - 2 Score 0 0 0 0 0  Altered sleeping 2 0 0 0   Tired, decreased energy 0 0 0 0   Change in appetite 0 0 0 0   Feeling bad or failure about yourself  0 0 0 0   Trouble concentrating 0 0 0 0   Moving slowly or fidgety/restless 0 0 0 0   Suicidal thoughts 0 0 0 0   PHQ-9 Score 2 0 0 0   Difficult doing work/chores  Not difficult at all Not difficult at all Not difficult at  all     Health Maintenance  Topic Date Due   INFLUENZA VACCINE  08/31/2021   COVID-19 Vaccine (6 - 2023-24 season) 10/01/2021   COLONOSCOPY (Pts 45-62yr Insurance coverage will need to be confirmed)  06/17/2022 (Originally 01/29/1997)   DTaP/Tdap/Td (2 - Td or Tdap) 11/13/2022   Pneumonia Vaccine 70 Years old  Completed   Hepatitis C Screening  Completed   Zoster Vaccines- Shingrix  Completed   HPV VACCINES  Aged Out  Colon ca screening, declined.  Prostate: does  have family history of prostate cancer, father.  The natural history of prostate cancer and ongoing controversy regarding screening and potential treatment outcomes of prostate cancer has been discussed with the patient. The meaning of a false positive PSA and a false negative PSA has been discussed. He indicates understanding of the limitations of this screening test and wishes  to proceed with screening PSA testing. Lab Results  Component  Value Date   PSA1 0.6 09/15/2016   PSA 0.67 11/30/2020   PSA 0.64 01/13/2014   PSA 0.55 11/12/2012  Prior skin lesions removed. Hx of basal cell. Followed by dermatology yearly.   Immunization History  Administered Date(s) Administered   Fluad Quad(high Dose 65+) 10/17/2018, 11/06/2019, 11/30/2020   Influenza Split 11/19/2011, 10/27/2014, 12/02/2015   Influenza, High Dose Seasonal PF 12/13/2017   Influenza,inj,Quad PF,6+ Mos 11/12/2012, 01/13/2014   PFIZER(Purple Top)SARS-COV-2 Vaccination 05/04/2019, 06/01/2019, 01/17/2020, 07/04/2020, 02/06/2021   Pneumococcal Conjugate-13 09/14/2017   Pneumococcal Polysaccharide-23 10/17/2018   Tdap 11/12/2012   Zoster Recombinat (Shingrix) 12/21/2020, 02/15/2021  [facility]   No results found. Optho yearly. Planned cataract surgery.   Dental:Within Last 6 months next week.   Alcohol: none  Tobacco: none  Exercise: limited by recent knee issue.    History Patient Active Problem List   Diagnosis Date Noted   Family history of factor V Leiden mutation 11/06/2019   Essential hypertension 12/10/2011   Hyperlipidemia 12/10/2011   Erectile dysfunction 12/10/2011   Past Medical History:  Diagnosis Date   Allergy    Arthritis    Basal cell carcinoma    Erectile dysfunction    Hyperlipidemia    Hypertension    Past Surgical History:  Procedure Laterality Date   KNEE SURGERY     LUMBAR DISC SURGERY     SPINE SURGERY     No Known Allergies Prior to Admission medications   Medication Sig Start Date End Date Taking? Authorizing Provider  albuterol (VENTOLIN HFA) 108 (90 Base) MCG/ACT inhaler INHALE 1-2 PUFFS INTO THE LUNGS EVERY 4 HOURS AS NEEDED FOR WHEEZING OR SHORTNESS OF BREATH. 01/15/21  Yes MMaximiano Coss NP  Ascorbic Acid (VITAMIN C) 100 MG tablet Take 100 mg by mouth daily.    Yes [provider]  Azelastine-Fluticasone 137-50 MCG/ACT SUSP PLACE 1 SPRAY INTO THE NOSE EVERY 12  (TWELVE) HOURS. 10/25/21  Yes GWendie Agreste MD  Azelastine-Fluticasone 137-50 MCG/ACT SUSP Place into the nose. 07/01/21  Yes [provider]  celecoxib (CELEBREX) 50 MG capsule Take 50 mg by mouth 2 (two) times daily. Pt does not know strength   Yes [provider]  fluticasone-salmeterol (ADVAIR HFA) 115-21 MCG/ACT inhaler Inhale 2 puffs into the lungs 2 (two) times daily. 07/01/21  Yes GWendie Agreste MD  glucosamine-chondroitin 500-400 MG tablet Take 1 tablet by mouth 3 (three) times daily.   Yes [provider]  lisinopril-hydrochlorothiazide (ZESTORETIC) 20-25 MG tablet Take 1 tablet by mouth daily.  07/01/21  Yes Wendie Agreste, MD  loratadine (CLARITIN) 10 MG tablet TAKE 1 TABLET BY MOUTH EVERYDAY AT BEDTIME 12/23/21  Yes Wendie Agreste, MD  loratadine (CLARITIN) 10 MG tablet Take by mouth. 06/30/21  Yes [provider]  pravastatin (PRAVACHOL) 80 MG tablet TAKE 1 TABLET BY MOUTH EVERY DAY 12/30/21  Yes Wendie Agreste, MD   Social History   Socioeconomic History   Marital status: Married    Spouse name: Not on file   Number of children: Not on file   Years of education: Not on file   Highest education level: Not on file  Occupational History   Occupation: Automotive    Employer: [employer]  Tobacco Use   Smoking status: Former   Smokeless tobacco: Never  Substance and Sexual Activity   Alcohol use: No   Drug use: No   Sexual activity: Yes  Other Topics Concern   Not on file  Social History Narrative   Married. Education: [employer]. Exercise: Walk 2 times a week for 45 minutes.   Social Determinants of Health   Financial Resource Strain: Not on file  Food Insecurity: Not on file  Transportation Needs: Not on file  Physical Activity: Not on file  Stress: Not on file  Social Connections: Not on file  Intimate Partner Violence: Not on file    Review of Systems 13 point review of systems per patient health survey noted.   Negative other than as indicated above or in HPI.    Objective:   Vitals:   12/31/21 0915  BP: 130/70  Pulse: 90  Temp: 98.9 F (37.2 C)  SpO2: 97%  Weight: 247 lb 9.6 oz (112.3 kg)  Height: '6\' 3"'$  (1.905 m)     Physical Exam Vitals reviewed.  Constitutional:      Appearance: He is well-developed.  HENT:     Head: Normocephalic and atraumatic.     Right Ear: External ear normal.     Left Ear: External ear normal.     Nose:     Comments: Sinuses nontender.  Eyes:     Conjunctiva/sclera: Conjunctivae normal.     Pupils: Pupils are equal, round, and reactive to light.  Neck:     Thyroid: No thyromegaly.  Cardiovascular:     Rate and Rhythm: Normal rate and regular rhythm.     Heart sounds: Normal heart sounds.  Pulmonary:     Effort: Pulmonary effort is normal. No respiratory distress.     Breath sounds: Normal breath sounds. No wheezing.  Abdominal:     General: There is no distension.     Palpations: Abdomen is soft.     Tenderness: There is no abdominal tenderness.  Musculoskeletal:        General: No tenderness. Normal range of motion.     Cervical back: Normal range of motion and neck supple.  Lymphadenopathy:     Cervical: No cervical adenopathy.  Skin:    General: Skin is warm and dry.  Neurological:     Mental Status: He is alert and oriented to person, place, and time.     Deep Tendon Reflexes: Reflexes are normal and symmetric.  Psychiatric:        Behavior: Behavior normal.        Assessment & Plan:  Joshua Levine is a 71 y.o. male . Annual physical exam  - -anticipatory guidance as below in AVS, screening labs above. Health maintenance items as above in HPI discussed/recommended as applicable.  Essential hypertension - Plan: lisinopril-hydrochlorothiazide (ZESTORETIC) 20-25 MG tablet, Comprehensive metabolic panel  -  Stable, tolerating current regimen. Medications refilled. Labs pending as above.   Allergic rhinitis, unspecified  seasonality, unspecified trigger - Plan: loratadine (CLARITIN) 10 MG tablet, Azelastine-Fluticasone 137-50 MCG/ACT SUSP  -Increased congestion recently, option of additional nasal spray with Atrovent, RTC precautions.  Does not appear to be bacterial sinusitis at this time  Hyperlipidemia, unspecified hyperlipidemia type - Plan: pravastatin (PRAVACHOL) 80 MG tablet, Comprehensive metabolic panel, Lipid panel  -Fasting lab visit planned.  Continue pravastatin as currently tolerated  Sinus congestion - Plan: ipratropium (ATROVENT) 0.06 % nasal spray  -= As above  Screening for malignant neoplasm of prostate - Plan: PSA  Prediabetes - Plan: Hemoglobin A1c  -Watch diet, exercise as tolerated, limited with the issue.  Continue follow-up with Ortho as planned.  Improving.  DOE (dyspnea on exertion) - Plan: fluticasone-salmeterol (ADVAIR HFA) 115-21 MCG/ACT inhaler Mild persistent asthma without complication - Plan: fluticasone-salmeterol (ADVAIR HFA) 115-21 MCG/ACT inhaler Cough, unspecified type - Plan: fluticasone-salmeterol (ADVAIR HFA) 115-21 MCG/ACT inhaler  -Stable with current use of Advair, continue same.  RTC precautions, 15-monthfollow-up.  Meds ordered this encounter  Medications   lisinopril-hydrochlorothiazide (ZESTORETIC) 20-25 MG tablet    Sig: Take 1 tablet by mouth daily.    Dispense:  90 tablet    Refill:  1   loratadine (CLARITIN) 10 MG tablet    Sig: TAKE 1 TABLET BY MOUTH EVERYDAY AT BEDTIME    Dispense:  90 tablet    Refill:  0   pravastatin (PRAVACHOL) 80 MG tablet    Sig: Take 1 tablet (80 mg total) by mouth daily.    Dispense:  90 tablet    Refill:  1   ipratropium (ATROVENT) 0.06 % nasal spray    Sig: Place 1-2 sprays into both nostrils 4 (four) times daily. As needed for nasal congestion    Dispense:  15 mL    Refill:  5   Azelastine-Fluticasone 137-50 MCG/ACT SUSP    Sig: Place 1 spray into the nose every 12 (twelve) hours.    Dispense:  23 g    Refill:   3   fluticasone-salmeterol (ADVAIR HFA) 115-21 MCG/ACT inhaler    Sig: Inhale 2 puffs into the lungs 2 (two) times daily.    Dispense:  1 each    Refill:  12   Patient Instructions  Lab visit next week - fasting: [facility] 8:30-4:30 during weekdays, no appointment needed [address]  [place] [id number] Continue your usual nasal spray, with option to add new nasal spray. Return to the clinic or go to the nearest emergency room if any of your symptoms worsen or new symptoms occur.  Stretches in legs at night, follow up if cramps continue.   Good luck with your knee - glad that is improving.   Preventive Care 635Years and Older, Male Preventive care refers to lifestyle choices and visits with your health care provider that can promote health and wellness. Preventive care visits are also called wellness exams. What can I expect for my preventive care visit? Counseling During your preventive care visit, your health care provider may ask about your: Medical history, including: Past medical problems. Family medical history. History of falls. Current health, including: Emotional well-being. Home life and relationship well-being. Sexual activity. Memory and ability to understand (cognition). Lifestyle, including: Alcohol, nicotine or tobacco, and drug use. Access to firearms. Diet, exercise, and  sleep habits. Work and work Statistician. Sunscreen use. Safety issues such as seatbelt and bike helmet use. Physical exam Your health care provider will check your: Height and weight. These may be used to calculate your BMI (body mass index). BMI is a measurement that tells if you are at a healthy weight. Waist circumference. This measures the distance around your waistline. This measurement also tells if you are at a healthy weight and may help predict your risk of certain diseases, such as type 2 diabetes and high blood pressure. Heart rate and blood pressure. Body  temperature. Skin for abnormal spots. What immunizations do I need?  Vaccines are usually given at various ages, according to a schedule. Your health care provider will recommend vaccines for you based on your age, medical history, and lifestyle or other factors, such as travel or where you work. What tests do I need? Screening Your health care provider may recommend screening tests for certain conditions. This may include: Lipid and cholesterol levels. Diabetes screening. This is done by checking your blood sugar (glucose) after you have not eaten for a while (fasting). Hepatitis C test. Hepatitis B test. HIV (human immunodeficiency virus) test. STI (sexually transmitted infection) testing, if you are at risk. Lung cancer screening. Colorectal cancer screening. Prostate cancer screening. Abdominal aortic aneurysm (AAA) screening. You may need this if you are a current or former smoker. Talk with your health care provider about your test results, treatment options, and if necessary, the need for more tests. Follow these instructions at home: Eating and drinking  Eat a diet that includes fresh fruits and vegetables, whole grains, lean protein, and low-fat dairy products. Limit your intake of foods with high amounts of sugar, saturated fats, and salt. Take vitamin and mineral supplements as recommended by your health care provider. Do not drink alcohol if your health care provider tells you not to drink. If you drink alcohol: Limit how much you have to 0-2 drinks a day. Know how much alcohol is in your drink. In the [place] one drink equals one 12 oz bottle of beer (355 mL), one 5 oz glass of wine (148 mL), or one 1 oz glass of hard liquor (44 mL). Lifestyle Brush your teeth every morning and night with fluoride toothpaste. Floss one time each day. Exercise for at least 30 minutes 5 or more days each week. Do not use any products that contain nicotine or tobacco. These products include  cigarettes, chewing tobacco, and vaping devices, such as e-cigarettes. If you need help quitting, ask your health care provider. Do not use drugs. If you are sexually active, practice safe sex. Use a condom or other form of protection to prevent STIs. Take aspirin only as told by your health care provider. Make sure that you understand how much to take and what form to take. Work with your health care provider to find out whether it is safe and beneficial for you to take aspirin daily. Ask your health care provider if you need to take a cholesterol-lowering medicine (statin). Find healthy ways to manage stress, such as: Meditation, yoga, or listening to music. Journaling. Talking to a trusted person. Spending time with friends and family. Safety Always wear your seat belt while driving or riding in a vehicle. Do not drive: If you have been drinking alcohol. Do not ride with someone who has been drinking. When you are tired or distracted. While texting. If you have been using any mind-altering substances or drugs. Wear a helmet and  other protective equipment during sports activities. If you have firearms in your house, make sure you follow all gun safety procedures. Minimize exposure to UV radiation to reduce your risk of skin cancer. What's next? Visit your health care provider once a year for an annual wellness visit. Ask your health care provider how often you should have your eyes and teeth checked. Stay up to date on all vaccines. This information is not intended to replace advice given to you by your health care provider. Make sure you discuss any questions you have with your health care provider. Document Revised: 07/15/2020 Document Reviewed: 07/15/2020 Elsevier Patient Education  [place],   [name], MD [place], [facility] 12/31/21 1:29 PM

## 2021-12-31 NOTE — Patient Instructions (Addendum)
Lab visit next week - fasting: [facility] 8:30-4:30 during weekdays, no appointment needed [place].  [place], [place] [address]  Continue your usual nasal spray, with option to add new nasal spray. Return to the clinic or go to the nearest emergency room if any of your symptoms worsen or new symptoms occur.  Stretches in legs at night, follow up if cramps continue.   Good luck with your knee - glad that is improving.   Preventive Care 63 Years and Older, Male Preventive care refers to lifestyle choices and visits with your health care provider that can promote health and wellness. Preventive care visits are also called wellness exams. What can I expect for my preventive care visit? Counseling During your preventive care visit, your health care provider may ask about your: Medical history, including: Past medical problems. Family medical history. History of falls. Current health, including: Emotional well-being. Home life and relationship well-being. Sexual activity. Memory and ability to understand (cognition). Lifestyle, including: Alcohol, nicotine or tobacco, and drug use. Access to firearms. Diet, exercise, and sleep habits. Work and work Statistician. Sunscreen use. Safety issues such as seatbelt and bike helmet use. Physical exam Your health care provider will check your: Height and weight. These may be used to calculate your BMI (body mass index). BMI is a measurement that tells if you are at a healthy weight. Waist circumference. This measures the distance around your waistline. This measurement also tells if you are at a healthy weight and may help predict your risk of certain diseases, such as type 2 diabetes and high blood pressure. Heart rate and blood pressure. Body temperature. Skin for abnormal spots. What immunizations do I need?  Vaccines are usually given at various ages, according to a schedule. Your health care provider will recommend  vaccines for you based on your age, medical history, and lifestyle or other factors, such as travel or where you work. What tests do I need? Screening Your health care provider may recommend screening tests for certain conditions. This may include: Lipid and cholesterol levels. Diabetes screening. This is done by checking your blood sugar (glucose) after you have not eaten for a while (fasting). Hepatitis C test. Hepatitis B test. HIV (human immunodeficiency virus) test. STI (sexually transmitted infection) testing, if you are at risk. Lung cancer screening. Colorectal cancer screening. Prostate cancer screening. Abdominal aortic aneurysm (AAA) screening. You may need this if you are a current or former smoker. Talk with your health care provider about your test results, treatment options, and if necessary, the need for more tests. Follow these instructions at home: Eating and drinking  Eat a diet that includes fresh fruits and vegetables, whole grains, lean protein, and low-fat dairy products. Limit your intake of foods with high amounts of sugar, saturated fats, and salt. Take vitamin and mineral supplements as recommended by your health care provider. Do not drink alcohol if your health care provider tells you not to drink. If you drink alcohol: Limit how much you have to 0-2 drinks a day. Know how much alcohol is in your drink. In the [place] one drink equals one 12 oz bottle of beer (355 mL), one 5 oz glass of wine (148 mL), or one 1 oz glass of hard liquor (44 mL). Lifestyle Brush your teeth every morning and night with fluoride toothpaste. Floss one time each day. Exercise for at least 30 minutes 5 or more days each week. Do not use any products that contain nicotine  or tobacco. These products include cigarettes, chewing tobacco, and vaping devices, such as e-cigarettes. If you need help quitting, ask your health care provider. Do not use drugs. If you are sexually active, practice  safe sex. Use a condom or other form of protection to prevent STIs. Take aspirin only as told by your health care provider. Make sure that you understand how much to take and what form to take. Work with your health care provider to find out whether it is safe and beneficial for you to take aspirin daily. Ask your health care provider if you need to take a cholesterol-lowering medicine (statin). Find healthy ways to manage stress, such as: Meditation, yoga, or listening to music. Journaling. Talking to a trusted person. Spending time with friends and family. Safety Always wear your seat belt while driving or riding in a vehicle. Do not drive: If you have been drinking alcohol. Do not ride with someone who has been drinking. When you are tired or distracted. While texting. If you have been using any mind-altering substances or drugs. Wear a helmet and other protective equipment during sports activities. If you have firearms in your house, make sure you follow all gun safety procedures. Minimize exposure to UV radiation to reduce your risk of skin cancer. What's next? Visit your health care provider once a year for an annual wellness visit. Ask your health care provider how often you should have your eyes and teeth checked. Stay up to date on all vaccines. This information is not intended to replace advice given to you by your health care provider. Make sure you discuss any questions you have with your health care provider. Document Revised: 07/15/2020 Document Reviewed: 07/15/2020 Elsevier Patient Education  [place].

## 2022-01-06 ENCOUNTER — Other Ambulatory Visit (INDEPENDENT_AMBULATORY_CARE_PROVIDER_SITE_OTHER): Payer: BC Managed Care – PPO

## 2022-01-06 DIAGNOSIS — Z125 Encounter for screening for malignant neoplasm of prostate: Secondary | ICD-10-CM | POA: Diagnosis not present

## 2022-01-06 LAB — PSA: PSA: 0.64 ng/mL (ref 0.10–4.00)

## 2022-02-01 DIAGNOSIS — H25013 Cortical age-related cataract, bilateral: Secondary | ICD-10-CM | POA: Diagnosis not present

## 2022-02-01 DIAGNOSIS — H18413 Arcus senilis, bilateral: Secondary | ICD-10-CM | POA: Diagnosis not present

## 2022-02-01 DIAGNOSIS — H2513 Age-related nuclear cataract, bilateral: Secondary | ICD-10-CM | POA: Diagnosis not present

## 2022-02-01 DIAGNOSIS — H25043 Posterior subcapsular polar age-related cataract, bilateral: Secondary | ICD-10-CM | POA: Diagnosis not present

## 2022-02-01 DIAGNOSIS — H2511 Age-related nuclear cataract, right eye: Secondary | ICD-10-CM | POA: Diagnosis not present

## 2022-04-25 DIAGNOSIS — H52201 Unspecified astigmatism, right eye: Secondary | ICD-10-CM | POA: Diagnosis not present

## 2022-04-25 DIAGNOSIS — H2511 Age-related nuclear cataract, right eye: Secondary | ICD-10-CM | POA: Diagnosis not present

## 2022-04-26 DIAGNOSIS — H2512 Age-related nuclear cataract, left eye: Secondary | ICD-10-CM | POA: Diagnosis not present

## 2022-05-02 DIAGNOSIS — H2513 Age-related nuclear cataract, bilateral: Secondary | ICD-10-CM | POA: Diagnosis not present

## 2022-05-09 DIAGNOSIS — H52202 Unspecified astigmatism, left eye: Secondary | ICD-10-CM | POA: Diagnosis not present

## 2022-05-09 DIAGNOSIS — H2512 Age-related nuclear cataract, left eye: Secondary | ICD-10-CM | POA: Diagnosis not present

## 2022-05-16 DIAGNOSIS — H2513 Age-related nuclear cataract, bilateral: Secondary | ICD-10-CM | POA: Diagnosis not present

## 2022-06-08 DIAGNOSIS — Z85828 Personal history of other malignant neoplasm of skin: Secondary | ICD-10-CM | POA: Diagnosis not present

## 2022-06-08 DIAGNOSIS — D1801 Hemangioma of skin and subcutaneous tissue: Secondary | ICD-10-CM | POA: Diagnosis not present

## 2022-06-08 DIAGNOSIS — L821 Other seborrheic keratosis: Secondary | ICD-10-CM | POA: Diagnosis not present

## 2022-06-08 DIAGNOSIS — L538 Other specified erythematous conditions: Secondary | ICD-10-CM | POA: Diagnosis not present

## 2022-06-08 DIAGNOSIS — Z08 Encounter for follow-up examination after completed treatment for malignant neoplasm: Secondary | ICD-10-CM | POA: Diagnosis not present

## 2022-06-08 DIAGNOSIS — B078 Other viral warts: Secondary | ICD-10-CM | POA: Diagnosis not present

## 2022-06-20 ENCOUNTER — Other Ambulatory Visit: Payer: Self-pay | Admitting: Family Medicine

## 2022-06-20 DIAGNOSIS — J309 Allergic rhinitis, unspecified: Secondary | ICD-10-CM

## 2022-06-23 ENCOUNTER — Other Ambulatory Visit: Payer: Self-pay | Admitting: Family Medicine

## 2022-06-23 DIAGNOSIS — I10 Essential (primary) hypertension: Secondary | ICD-10-CM

## 2022-06-24 ENCOUNTER — Other Ambulatory Visit: Payer: Self-pay | Admitting: Family Medicine

## 2022-06-24 DIAGNOSIS — J309 Allergic rhinitis, unspecified: Secondary | ICD-10-CM

## 2022-07-08 ENCOUNTER — Ambulatory Visit: Payer: BC Managed Care – PPO | Admitting: Family Medicine

## 2022-07-08 ENCOUNTER — Encounter: Payer: Self-pay | Admitting: Family Medicine

## 2022-07-08 VITALS — BP 122/78 | HR 69 | Temp 98.9°F | Ht 75.0 in | Wt 245.0 lb

## 2022-07-08 DIAGNOSIS — J309 Allergic rhinitis, unspecified: Secondary | ICD-10-CM | POA: Diagnosis not present

## 2022-07-08 DIAGNOSIS — I10 Essential (primary) hypertension: Secondary | ICD-10-CM

## 2022-07-08 DIAGNOSIS — E785 Hyperlipidemia, unspecified: Secondary | ICD-10-CM | POA: Diagnosis not present

## 2022-07-08 DIAGNOSIS — G8929 Other chronic pain: Secondary | ICD-10-CM

## 2022-07-08 DIAGNOSIS — M25561 Pain in right knee: Secondary | ICD-10-CM

## 2022-07-08 DIAGNOSIS — R7303 Prediabetes: Secondary | ICD-10-CM

## 2022-07-08 DIAGNOSIS — M791 Myalgia, unspecified site: Secondary | ICD-10-CM

## 2022-07-08 DIAGNOSIS — M25562 Pain in left knee: Secondary | ICD-10-CM

## 2022-07-08 LAB — COMPREHENSIVE METABOLIC PANEL
ALT: 57 U/L — ABNORMAL HIGH (ref 0–53)
AST: 36 U/L (ref 0–37)
Albumin: 4.8 g/dL (ref 3.5–5.2)
Alkaline Phosphatase: 68 U/L (ref 39–117)
BUN: 18 mg/dL (ref 6–23)
CO2: 27 mEq/L (ref 19–32)
Calcium: 9.9 mg/dL (ref 8.4–10.5)
Chloride: 101 mEq/L (ref 96–112)
Creatinine, Ser: 1.1 mg/dL (ref 0.40–1.50)
GFR: 68.05 mL/min (ref >60.00)
Glucose, Bld: 95 mg/dL (ref 70–99)
Potassium: 4.5 mEq/L (ref 3.5–5.1)
Sodium: 138 mEq/L (ref 135–145)
Total Bilirubin: 0.5 mg/dL (ref 0.2–1.2)
Total Protein: 7.8 g/dL (ref 6.0–8.3)

## 2022-07-08 LAB — LIPID PANEL
Cholesterol: 159 mg/dL (ref 0–200)
HDL: 32.6 mg/dL — ABNORMAL LOW (ref >39.00)
NonHDL: 125.91
Total CHOL/HDL Ratio: 5
Triglycerides: 328 mg/dL — ABNORMAL HIGH (ref 0.0–149.0)
VLDL: 65.6 mg/dL — ABNORMAL HIGH (ref 0.0–40.0)

## 2022-07-08 LAB — LDL CHOLESTEROL, DIRECT: Direct LDL: 100 mg/dL

## 2022-07-08 LAB — HEMOGLOBIN A1C: Hgb A1c MFr Bld: 6.1 % (ref 4.6–6.5)

## 2022-07-08 NOTE — Progress Notes (Signed)
Subjective:  Patient ID: Joshua Levine, male    DOB: Nov 23, 1950  Age: 72 y.o. MRN: [id number]  CC:  Chief Complaint  Patient presents with   Arthritis    Pt reports pain in knees for years. Pt reports this has been worse lately. Pt arthritis Pt is FASTING    HPI Joshua Levine presents for   Chronic medical issue follow-up and concern of knees as above.  Hypertension: Lisinopril HCTZ 20/25 mg daily. Home readings:none. No new med side effects.  BP Readings from Last 3 Encounters:  07/08/22 122/78  12/31/21 130/70  07/01/21 132/86   Lab Results  Component Value Date   CREATININE 1.02 07/01/2021   Allergic rhinitis Discussed at his physical, treated with azelastine, fluticasone, Claritin.  Advair 2 puffs twice daily, persistent sinus drainage.  Option of Atrovent nasal spray in addition to his other meds. Uses atrovent occasionally - helps.   Prediabetes: Diet/exercise approach, no current meds, weight down 2 pounds. Rare sugar beverages.  Exercise with work only.  Biscuit every morning.   Lab Results  Component Value Date   HGBA1C 6.4 11/30/2020   Wt Readings from Last 3 Encounters:  07/08/22 245 lb (111.1 kg)  12/31/21 247 lb 9.6 oz (112.3 kg)  07/01/21 247 lb 3.2 oz (112.1 kg)   Hyperlipidemia: Pravastatin 80 mg daily. Some muscle aches - legs, hands, feet, thigh, sides. Few years. Has not tried trial off statin.  Fasting today.  Lab Results  Component Value Date   CHOL 148 07/01/2021   HDL 30.90 (L) 07/01/2021   LDLCALC 75 11/06/2019   LDLDIRECT 88.0 07/01/2021   TRIG (H) 07/01/2021    444.0 Triglyceride is over 400; calculations on Lipids are invalid.   CHOLHDL 5 07/01/2021   Lab Results  Component Value Date   ALT 50 07/01/2021   AST 30 07/01/2021   ALKPHOS 81 07/01/2021   BILITOT 0.3 07/01/2021   Knee pain Discussed at his last physical, left knee injury in August with subsequent surgery, ACL tear, posterior lateral tibial plateau fracture  and meniscus tears.  Was in physical therapy last year, surgery in September. Pain in both knees for years, just getting worse. Has discussed with surgeon in past. Treated with celebrex prior. Still taking one per day on workdays. No recent injury.  MRI left knee 09/07/2021, complete tear proximal ACL with pivot shift pattern of bone injury comminuted impaction fracture of the posterior lateral tibial plateau, grade 1 sprain MCL and fibular collateral ligaments, tears of the medial lateral menisci and tricompartmental osteoarthritis with chondrosis.   History Patient Active Problem List   Diagnosis Date Noted   Family history of factor V Leiden mutation 11/06/2019   Essential hypertension 12/10/2011   Hyperlipidemia 12/10/2011   Erectile dysfunction 12/10/2011   Past Medical History:  Diagnosis Date   Allergy    Arthritis    Basal cell carcinoma    Erectile dysfunction    Hyperlipidemia    Hypertension    Past Surgical History:  Procedure Laterality Date   cataract Bilateral    KNEE SURGERY     LUMBAR DISC SURGERY     SPINE SURGERY     No Known Allergies Prior to Admission medications   Medication Sig Start Date End Date Taking? Authorizing Provider  albuterol (VENTOLIN HFA) 108 (90 Base) MCG/ACT inhaler INHALE 1-2 PUFFS INTO THE LUNGS EVERY 4 HOURS AS NEEDED FOR WHEEZING OR SHORTNESS OF BREATH. 01/15/21  Yes Janeece Agee, NP  Ascorbic Acid (VITAMIN C)  100 MG tablet Take 100 mg by mouth daily.    Yes [provider]  Azelastine-Fluticasone 137-50 MCG/ACT SUSP PLACE 1 SPRAY INTO THE NOSE EVERY 12 (TWELVE) HOURS. 06/20/22  Yes Shade Flood, MD  celecoxib (CELEBREX) 50 MG capsule Take 50 mg by mouth 2 (two) times daily. Pt does not know strength   Yes [provider]  fluticasone-salmeterol (ADVAIR HFA) 115-21 MCG/ACT inhaler Inhale 2 puffs into the lungs 2 (two) times daily. 12/31/21  Yes Shade Flood, MD  gatifloxacin (ZYMAXID) 0.5 % SOLN Place 1 drop  into the left eye 4 (four) times daily. 04/26/22  Yes [provider]  glucosamine-chondroitin 500-400 MG tablet Take 1 tablet by mouth 3 (three) times daily.   Yes [provider]  ipratropium (ATROVENT) 0.06 % nasal spray Place 1-2 sprays into both nostrils 4 (four) times daily. As needed for nasal congestion 12/31/21  Yes Shade Flood, MD  lisinopril-hydrochlorothiazide (ZESTORETIC) 20-25 MG tablet TAKE 1 TABLET BY MOUTH EVERY DAY 06/23/22  Yes Shade Flood, MD  loratadine (CLARITIN) 10 MG tablet TAKE 1 TABLET BY MOUTH EVERYDAY AT BEDTIME 06/24/22  Yes Shade Flood, MD  pravastatin (PRAVACHOL) 80 MG tablet Take 1 tablet (80 mg total) by mouth daily. 12/31/21  Yes Shade Flood, MD  diclofenac Sodium (VOLTAREN) 1 % GEL Apply topically. Patient not taking: Reported on 07/08/2022 09/06/21   [provider]  Difluprednate 0.05 % EMUL Place 1 drop into the left eye 4 (four) times daily. Patient not taking: Reported on 07/08/2022 04/26/22   [provider]  ketorolac (ACULAR) 0.5 % ophthalmic solution Place 1 drop into the left eye 4 (four) times daily. Patient not taking: Reported on 07/08/2022 04/26/22   [provider]  loratadine (CLARITIN) 10 MG tablet Take by mouth. Patient not taking: Reported on 07/08/2022 06/30/21   [provider]   Social History   Socioeconomic History   Marital status: Married    Spouse name: Not on file   Number of children: Not on file   Years of education: Not on file   Highest education level: Associate degree: occupational, Scientist, product/process development, or vocational program  Occupational History   Occupation: Radiation protection practitioner: [employer]  Tobacco Use   Smoking status: Former   Smokeless tobacco: Never  Substance and Sexual Activity   Alcohol use: No   Drug use: No   Sexual activity: Yes  Other Topics Concern   Not on file  Social History Narrative   Married. Education: [employer]. Exercise: Walk 2 times a week  for 45 minutes.   Social Determinants of Health   Financial Resource Strain: Low Risk  (07/07/2022)   Overall Financial Resource Strain (CARDIA)    Difficulty of Paying Living Expenses: Not hard at all  Food Insecurity: No Food Insecurity (07/07/2022)   Hunger Vital Sign    Worried About Running Out of Food in the Last Year: Never true    Ran Out of Food in the Last Year: Never true  Transportation Needs: No Transportation Needs (07/07/2022)   PRAPARE - Administrator, Civil Service (Medical): No    Lack of Transportation (Non-Medical): No  Physical Activity: Insufficiently Active (07/07/2022)   Exercise Vital Sign    Days of Exercise per Week: 1 day    Minutes of Exercise per Session: 10 min  Stress: No Stress Concern Present (07/07/2022)   [employer] of Occupational Health - Occupational Stress Questionnaire  Feeling of Stress : Not at all  Social Connections: Moderately Isolated (07/07/2022)   Social Connection and Isolation Panel [NHANES]    Frequency of Communication with Friends and Family: Three times a week    Frequency of Social Gatherings with Friends and Family: Once a week    Attends Religious Services: Never    Database administrator or Organizations: No    Attends Engineer, structural: Not on file    Marital Status: Married  Catering manager Violence: Not on file    Review of Systems  Constitutional:  Negative for fatigue and unexpected weight change.  Eyes:  Negative for visual disturbance.  Respiratory:  Negative for cough, chest tightness and shortness of breath.   Cardiovascular:  Negative for chest pain and palpitations.  Gastrointestinal:  Negative for abdominal pain and blood in stool.  Musculoskeletal:  Positive for arthralgias and myalgias.  Neurological:  Negative for dizziness, light-headedness and headaches.     Objective:   Vitals:   07/08/22 0759  BP: 122/78  Pulse: 69  Temp: 98.9 F (37.2 C)  SpO2: 96%  Weight: 245 lb  (111.1 kg)  Height: 6\' 3"  (1.905 m)     Physical Exam Vitals reviewed.  Constitutional:      Appearance: He is well-developed.  HENT:     Head: Normocephalic and atraumatic.  Neck:     Vascular: No carotid bruit or JVD.  Cardiovascular:     Rate and Rhythm: Normal rate and regular rhythm.     Heart sounds: Normal heart sounds. No murmur heard. Pulmonary:     Effort: Pulmonary effort is normal.     Breath sounds: Normal breath sounds. No rales.  Musculoskeletal:     Right lower leg: No edema.     Left lower leg: No edema.     Comments: Intact range of motion bilateral knees, no focal bony tenderness.  No appreciable effusion.  Negative McMurray bilaterally.  Skin:    General: Skin is warm and dry.  Neurological:     Mental Status: He is alert and oriented to person, place, and time.  Psychiatric:        Mood and Affect: Mood normal.        Assessment & Plan:  Joshua Levine is a 73 y.o. male . Essential hypertension Stable on current regimen, check updated labs with medication adjustments accordingly.  Hyperlipidemia, unspecified hyperlipidemia type - Plan: Comprehensive metabolic panel, Lipid panel  -Check labs, option of intermittent statin dosing depending on myalgia symptoms off meds as below.  Prediabetes - Plan: Hemoglobin A1c  -Diet/exercise approach, check updated A1c with plan accordingly.  Borderline diabetes.  Avoidance of fast food recommended.  Allergic rhinitis, unspecified seasonality, unspecified trigger  -Stable with current med regimen, continue same  Myalgia  -Diffuse arthralgias/myalgias.  He is on statin as above.  Recommended temporary drug holiday for 1 to 2 weeks.  If myalgias resolve then restart once per week, slowly increasing dosing to tolerated amount.  May need other meds given hypertriglyceridemia.  If no change off meds, restart daily and follow-up to discuss other causes.  Chronic pain of both knees - Plan: Ambulatory referral to  Orthopedic Surgery  -Recommended follow-up with orthopedics given persistent symptoms and minimal change with daily Celebrex.  Potential long-term concerns discussed with Celebrex and would prefer he look into other treatment options, whether that be injection or surgical options.  Again can discuss with Ortho.  No orders of the defined types were placed  in this encounter.  Patient Instructions  Watch diet - avoid fast food for prediabetes. No new meds today.  Can try stopping pravastatin for the next 1 to 2 weeks.  If muscle aches improve off of the statin, then that could be the cause.  Would recommend restarting statin once per week, then increase by 1 day/week until muscle aches return, then return to previous dose.  We can discuss this further at follow-up.  If no change in muscle aches off the pravastatin, then that is not because and I would recommend restarting that daily. I will recheck your labs and if triglycerides are still high we may need to add other med.   I will refer you to ortho to discuss options for knee pain as I would like you to come off the celebrex if possible.   Recheck 1 month. Return to the clinic or go to the nearest emergency room if any of your symptoms worsen or new symptoms occur.   Chronic Knee Pain, Adult Chronic knee pain is pain in one or both knees that lasts longer than 3 months. Symptoms of chronic knee pain may include swelling, stiffness, and discomfort. Age-related wear and tear (osteoarthritis) of the knee joint is the most common cause of chronic knee pain. Other possible causes include: A long-term immune-related disease that causes inflammation of the knee (rheumatoid arthritis). This usually affects both knees. Inflammatory arthritis, such as gout or pseudogout. An injury to the knee that causes arthritis. An injury to the knee that damages the ligaments. Ligaments are strong tissues that connect bones to each other. Runner's knee or pain behind  the kneecap. Treatment for chronic knee pain depends on the cause. The main treatments for chronic knee pain are physical therapy and weight loss. This condition may also be treated with medicines, injections, a knee sleeve or brace, and by using crutches. Rest, ice, pressure (compression), and elevation, also known as RICE therapy, may also be recommended. Follow these instructions at home: If you have a knee sleeve or brace:  Wear the knee sleeve or brace as told by your health care provider. Remove it only as told by your health care provider. Loosen it if your toes tingle, become numb, or turn cold and blue. Keep it clean. If the sleeve or brace is not waterproof: Do not let it get wet. Remove it if allowed by your health care provider, or cover it with a watertight covering when you take a bath or a shower. Managing pain, stiffness, and swelling     If directed, apply heat to the affected area as often as told by your health care provider. Use the heat source that your health care provider recommends, such as a moist heat pack or a heating pad. If you have a removable knee sleeve or brace, remove it as told by your health care provider. Place a towel between your skin and the heat source. Leave the heat on for 20-30 minutes. Remove the heat if your skin turns bright red. This is especially important if you are unable to feel pain, heat, or cold. You may have a greater risk of getting burned. If directed, put ice on the affected area. To do this: If you have a removable knee sleeve or brace, remove it as told by your health care provider. Put ice in a plastic bag. Place a towel between your skin and the bag. Leave the ice on for 20 minutes, 2-3 times a day. Remove the ice if  your skin turns bright red. This is very important. If you cannot feel pain, heat, or cold, you have a greater risk of damage to the area. Move your toes often to reduce stiffness and swelling. Raise (elevate) the  injured area above the level of your heart while you are sitting or lying down. Activity Avoid high-impact activities or exercises, such as running, jumping rope, or doing jumping jacks. Follow the exercise plan that your health care provider designed for you. Your health care provider may suggest that you: Avoid activities that make knee pain worse. This may require you to change your exercise routines, sport participation, or job duties. Wear shoes with cushioned soles. Avoid sports that require running and sudden changes in direction. Do physical therapy. Physical therapy is planned to match your needs and abilities. It may include exercises for strength, flexibility, stability, and endurance. Do exercises that increase balance and strength, such as tai chi and yoga. Do not use the injured limb to support your body weight until your health care provider says that you can. Use crutches as told by your health care provider. Return to your normal activities as told by your health care provider. Ask your health care provider what activities are safe for you. General instructions Take over-the-counter and prescription medicines only as told by your health care provider. Lose weight if you are overweight. Losing even a little weight can reduce knee pain. Ask your health care provider what your ideal weight is, and how to safely lose extra weight. A dietitian may be able to help you plan your meals. Do not use any products that contain nicotine or tobacco, such as cigarettes, e-cigarettes, and chewing tobacco. These can delay healing. If you need help quitting, ask your health care provider. Keep all follow-up visits. This is important. Contact a health care provider if: You have knee pain that is not getting better or gets worse. You are unable to do your physical therapy exercises due to knee pain. Get help right away if: Your knee swells and the swelling becomes worse. You cannot move your  knee. You have severe knee pain. Summary Knee pain that lasts more than 3 months is considered chronic knee pain. The main treatments for chronic knee pain are physical therapy and weight loss. You may also need to take medicines, wear a knee sleeve or brace, use crutches, and apply ice or heat. Losing even a little weight can reduce knee pain. Ask your health care provider what your ideal weight is, and how to safely lose extra weight. A dietitian may be able to help you plan your meals. Follow the exercise plan that your health care provider designed for you. This information is not intended to replace advice given to you by your health care provider. Make sure you discuss any questions you have with your health care provider. Document Revised: 07/02/2019 Document Reviewed: 07/03/2019 Elsevier Patient Education  2024 Else[employer] Inc.     Signed,   Meredith Staggers, MD [facility], [facility] 07/08/22 8:42 AM

## 2022-07-08 NOTE — Patient Instructions (Addendum)
Watch diet - avoid fast food for prediabetes. No new meds today.  Can try stopping pravastatin for the next 1 to 2 weeks.  If muscle aches improve off of the statin, then that could be the cause.  Would recommend restarting statin once per week, then increase by 1 day/week until muscle aches return, then return to previous dose.  We can discuss this further at follow-up.  If no change in muscle aches off the pravastatin, then that is not because and I would recommend restarting that daily. I will recheck your labs and if triglycerides are still high we may need to add other med.   I will refer you to ortho to discuss options for knee pain as I would like you to come off the celebrex if possible.   Recheck 1 month. Return to the clinic or go to the nearest emergency room if any of your symptoms worsen or new symptoms occur.   Chronic Knee Pain, Adult Chronic knee pain is pain in one or both knees that lasts longer than 3 months. Symptoms of chronic knee pain may include swelling, stiffness, and discomfort. Age-related wear and tear (osteoarthritis) of the knee joint is the most common cause of chronic knee pain. Other possible causes include: A long-term immune-related disease that causes inflammation of the knee (rheumatoid arthritis). This usually affects both knees. Inflammatory arthritis, such as gout or pseudogout. An injury to the knee that causes arthritis. An injury to the knee that damages the ligaments. Ligaments are strong tissues that connect bones to each other. Runner's knee or pain behind the kneecap. Treatment for chronic knee pain depends on the cause. The main treatments for chronic knee pain are physical therapy and weight loss. This condition may also be treated with medicines, injections, a knee sleeve or brace, and by using crutches. Rest, ice, pressure (compression), and elevation, also known as RICE therapy, may also be recommended. Follow these instructions at home: If you  have a knee sleeve or brace:  Wear the knee sleeve or brace as told by your health care provider. Remove it only as told by your health care provider. Loosen it if your toes tingle, become numb, or turn cold and blue. Keep it clean. If the sleeve or brace is not waterproof: Do not let it get wet. Remove it if allowed by your health care provider, or cover it with a watertight covering when you take a bath or a shower. Managing pain, stiffness, and swelling     If directed, apply heat to the affected area as often as told by your health care provider. Use the heat source that your health care provider recommends, such as a moist heat pack or a heating pad. If you have a removable knee sleeve or brace, remove it as told by your health care provider. Place a towel between your skin and the heat source. Leave the heat on for 20-30 minutes. Remove the heat if your skin turns bright red. This is especially important if you are unable to feel pain, heat, or cold. You may have a greater risk of getting burned. If directed, put ice on the affected area. To do this: If you have a removable knee sleeve or brace, remove it as told by your health care provider. Put ice in a plastic bag. Place a towel between your skin and the bag. Leave the ice on for 20 minutes, 2-3 times a day. Remove the ice if your skin turns bright red. This is  very important. If you cannot feel pain, heat, or cold, you have a greater risk of damage to the area. Move your toes often to reduce stiffness and swelling. Raise (elevate) the injured area above the level of your heart while you are sitting or lying down. Activity Avoid high-impact activities or exercises, such as running, jumping rope, or doing jumping jacks. Follow the exercise plan that your health care provider designed for you. Your health care provider may suggest that you: Avoid activities that make knee pain worse. This may require you to change your exercise  routines, sport participation, or job duties. Wear shoes with cushioned soles. Avoid sports that require running and sudden changes in direction. Do physical therapy. Physical therapy is planned to match your needs and abilities. It may include exercises for strength, flexibility, stability, and endurance. Do exercises that increase balance and strength, such as tai chi and yoga. Do not use the injured limb to support your body weight until your health care provider says that you can. Use crutches as told by your health care provider. Return to your normal activities as told by your health care provider. Ask your health care provider what activities are safe for you. General instructions Take over-the-counter and prescription medicines only as told by your health care provider. Lose weight if you are overweight. Losing even a little weight can reduce knee pain. Ask your health care provider what your ideal weight is, and how to safely lose extra weight. A dietitian may be able to help you plan your meals. Do not use any products that contain nicotine or tobacco, such as cigarettes, e-cigarettes, and chewing tobacco. These can delay healing. If you need help quitting, ask your health care provider. Keep all follow-up visits. This is important. Contact a health care provider if: You have knee pain that is not getting better or gets worse. You are unable to do your physical therapy exercises due to knee pain. Get help right away if: Your knee swells and the swelling becomes worse. You cannot move your knee. You have severe knee pain. Summary Knee pain that lasts more than 3 months is considered chronic knee pain. The main treatments for chronic knee pain are physical therapy and weight loss. You may also need to take medicines, wear a knee sleeve or brace, use crutches, and apply ice or heat. Losing even a little weight can reduce knee pain. Ask your health care provider what your ideal weight is,  and how to safely lose extra weight. A dietitian may be able to help you plan your meals. Follow the exercise plan that your health care provider designed for you. This information is not intended to replace advice given to you by your health care provider. Make sure you discuss any questions you have with your health care provider. Document Revised: 07/02/2019 Document Reviewed: 07/03/2019 Elsevier Patient Education  2024 [employer].

## 2022-07-19 DIAGNOSIS — M25561 Pain in right knee: Secondary | ICD-10-CM | POA: Diagnosis not present

## 2022-07-19 DIAGNOSIS — M170 Bilateral primary osteoarthritis of knee: Secondary | ICD-10-CM | POA: Diagnosis not present

## 2022-07-19 DIAGNOSIS — M25562 Pain in left knee: Secondary | ICD-10-CM | POA: Diagnosis not present

## 2022-07-25 ENCOUNTER — Encounter: Payer: Self-pay | Admitting: Family Medicine

## 2022-07-25 ENCOUNTER — Ambulatory Visit: Payer: BC Managed Care – PPO | Admitting: Family Medicine

## 2022-07-25 VITALS — BP 130/74 | HR 87 | Temp 98.7°F | Ht 75.0 in | Wt 245.0 lb

## 2022-07-25 DIAGNOSIS — J019 Acute sinusitis, unspecified: Secondary | ICD-10-CM | POA: Diagnosis not present

## 2022-07-25 MED ORDER — AMOXICILLIN-POT CLAVULANATE 875-125 MG PO TABS
1.0000 | ORAL_TABLET | Freq: Two times a day (BID) | ORAL | 0 refills | Status: DC
Start: 2022-07-25 — End: 2022-12-16

## 2022-07-25 NOTE — Patient Instructions (Signed)
Start antibiotic for likely sinus infection.  Based on previous issues with cortisone injections I am hesitant to start prednisone at this time.  If pain is not improving into the next week to 10 days of the medicine, let me know and we can either order imaging or have ear nose and throat specialist see you.  Please follow-up if any new or worsening symptoms sooner.  Okay to continue the other chronic congestion medications as needed.  Take care.  Sinus Infection, Adult A sinus infection, also called sinusitis, is inflammation of your sinuses. Sinuses are hollow spaces in the bones around your face. Your sinuses are located: Around your eyes. In the middle of your forehead. Behind your nose. In your cheekbones. Mucus normally drains out of your sinuses. When your nasal tissues become inflamed or swollen, mucus can become trapped or blocked. This allows bacteria, viruses, and fungi to grow, which leads to infection. Most infections of the sinuses are caused by a virus. A sinus infection can develop quickly. It can last for up to 4 weeks (acute) or for more than 12 weeks (chronic). A sinus infection often develops after a cold. What are the causes? This condition is caused by anything that creates swelling in the sinuses or stops mucus from draining. This includes: Allergies. Asthma. Infection from bacteria or viruses. Deformities or blockages in your nose or sinuses. Abnormal growths in the nose (nasal polyps). Pollutants, such as chemicals or irritants in the air. Infection from fungi. This is rare. What increases the risk? You are more likely to develop this condition if you: Have a weak body defense system (immune system). Do a lot of swimming or diving. Overuse nasal sprays. Smoke. What are the signs or symptoms? The main symptoms of this condition are pain and a feeling of pressure around the affected sinuses. Other symptoms include: Stuffy nose or congestion that makes it difficult to  breathe through your nose. Thick yellow or greenish drainage from your nose. Tenderness, swelling, and warmth over the affected sinuses. A cough that may get worse at night. Decreased sense of smell and taste. Extra mucus that collects in the throat or the back of the nose (postnasal drip) causing a sore throat or bad breath. Tiredness (fatigue). Fever. How is this diagnosed? This condition is diagnosed based on: Your symptoms. Your medical history. A physical exam. Tests to find out if your condition is acute or chronic. This may include: Checking your nose for nasal polyps. Viewing your sinuses using a device that has a light (endoscope). Testing for allergies or bacteria. Imaging tests, such as an MRI or CT scan. In rare cases, a bone biopsy may be done to rule out more serious types of fungal sinus disease. How is this treated? Treatment for a sinus infection depends on the cause and whether your condition is chronic or acute. If caused by a virus, your symptoms should go away on their own within 10 days. You may be given medicines to relieve symptoms. They include: Medicines that shrink swollen nasal passages (decongestants). A spray that eases inflammation of the nostrils (topical intranasal corticosteroids). Rinses that help get rid of thick mucus in your nose (nasal saline washes). Medicines that treat allergies (antihistamines). Over-the-counter pain relievers. If caused by bacteria, your health care provider may recommend waiting to see if your symptoms improve. Most bacterial infections will get better without antibiotic medicine. You may be given antibiotics if you have: A severe infection. A weak immune system. If caused by narrow  nasal passages or nasal polyps, surgery may be needed. Follow these instructions at home: Medicines Take, use, or apply over-the-counter and prescription medicines only as told by your health care provider. These may include nasal sprays. If  you were prescribed an antibiotic medicine, take it as told by your health care provider. Do not stop taking the antibiotic even if you start to feel better. Hydrate and humidify  Drink enough fluid to keep your urine pale yellow. Staying hydrated will help to thin your mucus. Use a cool mist humidifier to keep the humidity level in your home above 50%. Inhale steam for 10-15 minutes, 3-4 times a day, or as told by your health care provider. You can do this in the bathroom while a hot shower is running. Limit your exposure to cool or dry air. Rest Rest as much as possible. Sleep with your head raised (elevated). Make sure you get enough sleep each night. General instructions  Apply a warm, moist washcloth to your face 3-4 times a day or as told by your health care provider. This will help with discomfort. Use nasal saline washes as often as told by your health care provider. Wash your hands often with soap and water to reduce your exposure to germs. If soap and water are not available, use hand sanitizer. Do not smoke. Avoid being around people who are smoking (secondhand smoke). Keep all follow-up visits. This is important. Contact a health care provider if: You have a fever. Your symptoms get worse. Your symptoms do not improve within 10 days. Get help right away if: You have a severe headache. You have persistent vomiting. You have severe pain or swelling around your face or eyes. You have vision problems. You develop confusion. Your neck is stiff. You have trouble breathing. These symptoms may be an emergency. Get help right away. Call 911. Do not wait to see if the symptoms will go away. Do not drive yourself to the hospital. Summary A sinus infection is soreness and inflammation of your sinuses. Sinuses are hollow spaces in the bones around your face. This condition is caused by nasal tissues that become inflamed or swollen. The swelling traps or blocks the flow of mucus.  This allows bacteria, viruses, and fungi to grow, which leads to infection. If you were prescribed an antibiotic medicine, take it as told by your health care provider. Do not stop taking the antibiotic even if you start to feel better. Keep all follow-up visits. This is important. This information is not intended to replace advice given to you by your health care provider. Make sure you discuss any questions you have with your health care provider. Document Revised: 12/22/2020 Document Reviewed: 12/22/2020 Elsevier Patient Education  2024 [employer].

## 2022-07-25 NOTE — Progress Notes (Signed)
Subjective:  Patient ID: Joshua Levine, male    DOB: Aug 16, 1950  Age: 72 y.o. MRN: [id number]  CC:  Chief Complaint  Patient presents with   Facial Pain    Pt notes sinus pain in the Rt cheek, 3 weeks, notes will have a lot of drainage and get some relief but short lived, some nausea,     HPI Joshua Levine presents for   Sinus pain: History of allergic rhinitis, last discussed June 7.  Treated azelastine, fluticasone Claritin.  Persistent sinus drainage with option of Atrovent nasal spray with occasional use that was helpful.    Recently reports sinus pain in his left cheek for the past 3 weeks, painful episodes at times, improves with drainage (unknown if discolored - drains down back), minimal short-term relief with atrovent NS. minimal nausea.slight HA - top of head, mild. No fevers.  No apparent facial swelling or bleeding. Upper and lower teeth. Left ear aches at times. No hot/cold sensitivity to teeth.   Recurrent hiccups with cortisone shots prior.   History Patient Active Problem List   Diagnosis Date Noted   Family history of factor V Leiden mutation 11/06/2019   Essential hypertension 12/10/2011   Hyperlipidemia 12/10/2011   Erectile dysfunction 12/10/2011   Past Medical History:  Diagnosis Date   Allergy    Arthritis    Basal cell carcinoma    Erectile dysfunction    Hyperlipidemia    Hypertension    Past Surgical History:  Procedure Laterality Date   cataract Bilateral    KNEE SURGERY     LUMBAR DISC SURGERY     SPINE SURGERY     No Known Allergies Prior to Admission medications   Medication Sig Start Date End Date Taking? Authorizing Provider  albuterol (VENTOLIN HFA) 108 (90 Base) MCG/ACT inhaler INHALE 1-2 PUFFS INTO THE LUNGS EVERY 4 HOURS AS NEEDED FOR WHEEZING OR SHORTNESS OF BREATH. 01/15/21   Janeece Agee, NP  Ascorbic Acid (VITAMIN C) 100 MG tablet Take 100 mg by mouth daily.     [provider]  Azelastine-Fluticasone 137-50  MCG/ACT SUSP PLACE 1 SPRAY INTO THE NOSE EVERY 12 (TWELVE) HOURS. 06/20/22   Shade Flood, MD  celecoxib (CELEBREX) 50 MG capsule Take 50 mg by mouth 2 (two) times daily. Pt does not know strength    [provider]  fluticasone-salmeterol (ADVAIR HFA) 115-21 MCG/ACT inhaler Inhale 2 puffs into the lungs 2 (two) times daily. 12/31/21   Shade Flood, MD  gatifloxacin (ZYMAXID) 0.5 % SOLN Place 1 drop into the left eye 4 (four) times daily. 04/26/22   [provider]  glucosamine-chondroitin 500-400 MG tablet Take 1 tablet by mouth 3 (three) times daily.    [provider]  ipratropium (ATROVENT) 0.06 % nasal spray Place 1-2 sprays into both nostrils 4 (four) times daily. As needed for nasal congestion 12/31/21   Shade Flood, MD  ketorolac (ACULAR) 0.5 % ophthalmic solution Place 1 drop into the left eye 4 (four) times daily. Patient not taking: Reported on 07/08/2022 04/26/22   [provider]  lisinopril-hydrochlorothiazide (ZESTORETIC) 20-25 MG tablet TAKE 1 TABLET BY MOUTH EVERY DAY 06/23/22   Shade Flood, MD  loratadine (CLARITIN) 10 MG tablet Take by mouth. Patient not taking: Reported on 07/08/2022 06/30/21   [provider]  loratadine (CLARITIN) 10 MG tablet TAKE 1 TABLET BY MOUTH EVERYDAY AT BEDTIME 06/24/22   Shade Flood, MD  pravastatin (PRAVACHOL) 80 MG tablet Take 1  tablet (80 mg total) by mouth daily. 12/31/21   Shade Flood, MD   Social History   Socioeconomic History   Marital status: Married    Spouse name: Not on file   Number of children: Not on file   Years of education: Not on file   Highest education level: Associate degree: occupational, Scientist, product/process development, or vocational program  Occupational History   Occupation: Radiation protection practitioner: [employer]  Tobacco Use   Smoking status: Former   Smokeless tobacco: Never  Substance and Sexual Activity   Alcohol use: No   Drug use: No   Sexual activity: Yes  Other Topics  Concern   Not on file  Social History Narrative   Married. Education: [employer]. Exercise: Walk 2 times a week for 45 minutes.   Social Determinants of Health   Financial Resource Strain: Low Risk  (07/07/2022)   Overall Financial Resource Strain (CARDIA)    Difficulty of Paying Living Expenses: Not hard at all  Food Insecurity: No Food Insecurity (07/07/2022)   Hunger Vital Sign    Worried About Running Out of Food in the Last Year: Never true    Ran Out of Food in the Last Year: Never true  Transportation Needs: No Transportation Needs (07/07/2022)   PRAPARE - Administrator, Civil Service (Medical): No    Lack of Transportation (Non-Medical): No  Physical Activity: Insufficiently Active (07/07/2022)   Exercise Vital Sign    Days of Exercise per Week: 1 day    Minutes of Exercise per Session: 10 min  Stress: No Stress Concern Present (07/07/2022)   [employer] of Occupational Health - Occupational Stress Questionnaire    Feeling of Stress : Not at all  Social Connections: Moderately Isolated (07/07/2022)   Social Connection and Isolation Panel [NHANES]    Frequency of Communication with Friends and Family: Three times a week    Frequency of Social Gatherings with Friends and Family: Once a week    Attends Religious Services: Never    Database administrator or Organizations: No    Attends Engineer, structural: Not on file    Marital Status: Married  Catering manager Violence: Not on file    Review of Systems   Objective:   Vitals:   07/25/22 1521  BP: 130/74  Pulse: 87  Temp: 98.7 F (37.1 C)  TempSrc: Temporal  SpO2: 97%  Weight: 245 lb (111.1 kg)  Height: 6\' 3"  (1.905 m)     Physical Exam Vitals reviewed.  Constitutional:      Appearance: He is well-developed.  HENT:     Head: Normocephalic and atraumatic.     Right Ear: Tympanic membrane, ear canal and external ear normal.     Left Ear: Tympanic membrane, ear canal and external ear  normal.     Nose: No rhinorrhea.     Comments: Small amount of dried crusted discharge at the left nasal ala.  No bleeding or dried blood appreciated, no visible masses on external exam of ala.  Frontal, maxillary sinus without focal area of tenderness but does report area of discomfort behind the left maxillary sinus.  TMJ nontender, no clicks or discomfort with jaw motion.  No neck lymphadenopathy.  Pinnae nontender, left canal clear, TM pearly gray.    Mouth/Throat:     Mouth: Mucous membranes are moist.     Pharynx: No oropharyngeal exudate or posterior oropharyngeal erythema.  Eyes:     Conjunctiva/sclera: Conjunctivae normal.  Pupils: Pupils are equal, round, and reactive to light.  Cardiovascular:     Rate and Rhythm: Normal rate and regular rhythm.     Heart sounds: Normal heart sounds. No murmur heard. Pulmonary:     Effort: Pulmonary effort is normal.     Breath sounds: Normal breath sounds. No wheezing, rhonchi or rales.  Abdominal:     Palpations: Abdomen is soft.     Tenderness: There is no abdominal tenderness.  Musculoskeletal:     Cervical back: Neck supple.  Lymphadenopathy:     Cervical: No cervical adenopathy.  Skin:    General: Skin is warm and dry.     Findings: No rash.  Neurological:     Mental Status: He is alert and oriented to person, place, and time.  Psychiatric:        Behavior: Behavior normal.        Assessment & Plan:  Joshua Levine is a 72 y.o. male . Subacute sinusitis, unspecified location - Plan: amoxicillin-clavulanate (AUGMENTIN) 875-125 MG tablet  Nasal congestion with likely secondary sinusitis.  Differential includes sphenoid sinusitis based on location, lack of pain with maxillary precaution.  No red flags on exam, afebrile, no appreciable facial swelling.  No rash.  Trial of Augmentin, hold on prednisone for now given side effects with cortisone injections in the past.  Consider limited CT of sinuses if not improving or ENT eval.  RTC precautions.    Meds ordered this encounter  Medications   amoxicillin-clavulanate (AUGMENTIN) 875-125 MG tablet    Sig: Take 1 tablet by mouth 2 (two) times daily.    Dispense:  20 tablet    Refill:  0   Patient Instructions  Start antibiotic for likely sinus infection.  Based on previous issues with cortisone injections I am hesitant to start prednisone at this time.  If pain is not improving into the next week to 10 days of the medicine, let me know and we can either order imaging or have ear nose and throat specialist see you.  Please follow-up if any new or worsening symptoms sooner.  Okay to continue the other chronic congestion medications as needed.  Take care.  Sinus Infection, Adult A sinus infection, also called sinusitis, is inflammation of your sinuses. Sinuses are hollow spaces in the bones around your face. Your sinuses are located: Around your eyes. In the middle of your forehead. Behind your nose. In your cheekbones. Mucus normally drains out of your sinuses. When your nasal tissues become inflamed or swollen, mucus can become trapped or blocked. This allows bacteria, viruses, and fungi to grow, which leads to infection. Most infections of the sinuses are caused by a virus. A sinus infection can develop quickly. It can last for up to 4 weeks (acute) or for more than 12 weeks (chronic). A sinus infection often develops after a cold. What are the causes? This condition is caused by anything that creates swelling in the sinuses or stops mucus from draining. This includes: Allergies. Asthma. Infection from bacteria or viruses. Deformities or blockages in your nose or sinuses. Abnormal growths in the nose (nasal polyps). Pollutants, such as chemicals or irritants in the air. Infection from fungi. This is rare. What increases the risk? You are more likely to develop this condition if you: Have a weak body defense system (immune system). Do a lot of swimming or  diving. Overuse nasal sprays. Smoke. What are the signs or symptoms? The main symptoms of this condition are pain and a  feeling of pressure around the affected sinuses. Other symptoms include: Stuffy nose or congestion that makes it difficult to breathe through your nose. Thick yellow or greenish drainage from your nose. Tenderness, swelling, and warmth over the affected sinuses. A cough that may get worse at night. Decreased sense of smell and taste. Extra mucus that collects in the throat or the back of the nose (postnasal drip) causing a sore throat or bad breath. Tiredness (fatigue). Fever. How is this diagnosed? This condition is diagnosed based on: Your symptoms. Your medical history. A physical exam. Tests to find out if your condition is acute or chronic. This may include: Checking your nose for nasal polyps. Viewing your sinuses using a device that has a light (endoscope). Testing for allergies or bacteria. Imaging tests, such as an MRI or CT scan. In rare cases, a bone biopsy may be done to rule out more serious types of fungal sinus disease. How is this treated? Treatment for a sinus infection depends on the cause and whether your condition is chronic or acute. If caused by a virus, your symptoms should go away on their own within 10 days. You may be given medicines to relieve symptoms. They include: Medicines that shrink swollen nasal passages (decongestants). A spray that eases inflammation of the nostrils (topical intranasal corticosteroids). Rinses that help get rid of thick mucus in your nose (nasal saline washes). Medicines that treat allergies (antihistamines). Over-the-counter pain relievers. If caused by bacteria, your health care provider may recommend waiting to see if your symptoms improve. Most bacterial infections will get better without antibiotic medicine. You may be given antibiotics if you have: A severe infection. A weak immune system. If caused by  narrow nasal passages or nasal polyps, surgery may be needed. Follow these instructions at home: Medicines Take, use, or apply over-the-counter and prescription medicines only as told by your health care provider. These may include nasal sprays. If you were prescribed an antibiotic medicine, take it as told by your health care provider. Do not stop taking the antibiotic even if you start to feel better. Hydrate and humidify  Drink enough fluid to keep your urine pale yellow. Staying hydrated will help to thin your mucus. Use a cool mist humidifier to keep the humidity level in your home above 50%. Inhale steam for 10-15 minutes, 3-4 times a day, or as told by your health care provider. You can do this in the bathroom while a hot shower is running. Limit your exposure to cool or dry air. Rest Rest as much as possible. Sleep with your head raised (elevated). Make sure you get enough sleep each night. General instructions  Apply a warm, moist washcloth to your face 3-4 times a day or as told by your health care provider. This will help with discomfort. Use nasal saline washes as often as told by your health care provider. Wash your hands often with soap and water to reduce your exposure to germs. If soap and water are not available, use hand sanitizer. Do not smoke. Avoid being around people who are smoking (secondhand smoke). Keep all follow-up visits. This is important. Contact a health care provider if: You have a fever. Your symptoms get worse. Your symptoms do not improve within 10 days. Get help right away if: You have a severe headache. You have persistent vomiting. You have severe pain or swelling around your face or eyes. You have vision problems. You develop confusion. Your neck is stiff. You have trouble breathing. These symptoms  may be an emergency. Get help right away. Call 911. Do not wait to see if the symptoms will go away. Do not drive yourself to the  hospital. Summary A sinus infection is soreness and inflammation of your sinuses. Sinuses are hollow spaces in the bones around your face. This condition is caused by nasal tissues that become inflamed or swollen. The swelling traps or blocks the flow of mucus. This allows bacteria, viruses, and fungi to grow, which leads to infection. If you were prescribed an antibiotic medicine, take it as told by your health care provider. Do not stop taking the antibiotic even if you start to feel better. Keep all follow-up visits. This is important. This information is not intended to replace advice given to you by your health care provider. Make sure you discuss any questions you have with your health care provider. Document Revised: 12/22/2020 Document Reviewed: 12/22/2020 Elsevier Patient Education  2024 [employer]     Signed,   Meredith Staggers, MD  Primary Care, [facility] 07/25/22 4:00 PM

## 2022-08-10 ENCOUNTER — Telehealth: Payer: BC Managed Care – PPO | Admitting: Family Medicine

## 2022-08-15 ENCOUNTER — Encounter: Payer: Self-pay | Admitting: Family Medicine

## 2022-08-15 ENCOUNTER — Telehealth: Payer: BC Managed Care – PPO | Admitting: Family Medicine

## 2022-08-15 DIAGNOSIS — E785 Hyperlipidemia, unspecified: Secondary | ICD-10-CM | POA: Diagnosis not present

## 2022-08-15 DIAGNOSIS — M791 Myalgia, unspecified site: Secondary | ICD-10-CM | POA: Diagnosis not present

## 2022-08-15 NOTE — Progress Notes (Signed)
Virtual Visit via Video Note  I connected with [name] on 08/15/22 at 4:50 PM by a video enabled telemedicine application and verified that I am speaking with the correct person using two identifiers.  Patient location: work, in private location.  My location: office - [place].    I discussed the limitations, risks, security and privacy concerns of performing an evaluation and management service by telephone and the availability of in person appointments. I also discussed with the patient that there may be a patient responsible charge related to this service. The patient expressed understanding and agreed to proceed, consent obtained  Chief complaint:  Chief Complaint  Patient presents with   Medical Management of Chronic Issues    Pt notes change to statin has improved the muscle aches, notes he is still working the medication back in, did see ortho for knee pain notes had Cortizone injections in both legs and those both helped significantly     History of Present Illness: Joshua Levine is a 72 y.o. male  Sinusitis - treated with Augmentin. Completed course - symptoms resolved.   Hyperlipidemia: Diffuse arthralgia/myalgia discussed in June.  Prior on pravastatin. Option of drug holiday for 1 to 2 weeks, if myalgias resolve then restart intermittent dosing, initially once per week. Aches improved off statin - off for 2 weeks. Not completely gone, but aches did improve some. Some aches with activity in legs after injections.  Back on pravastatin and, has been able to increase up to every other day (past week). Still doing ok and not to pain that he had with daily use.   Lab Results  Component Value Date   CHOL 159 07/08/2022   HDL 32.60 (L) 07/08/2022   LDLCALC 75 11/06/2019   LDLDIRECT 100.0 07/08/2022   TRIG 328.0 (H) 07/08/2022   CHOLHDL 5 07/08/2022   Lab Results  Component Value Date   ALT 57 (H) 07/08/2022   AST 36 07/08/2022   ALKPHOS 68 07/08/2022    BILITOT 0.5 07/08/2022   Bilateral knee pain Discussed in June, minimal change with Celebrex.  Potential long-term concerns discussed, referred to orthopedics.  Has seen Ortho, bilateral corticosteroid injections have been helpful.    Patient Active Problem List   Diagnosis Date Noted   Family history of factor V Leiden mutation 11/06/2019   Essential hypertension 12/10/2011   Hyperlipidemia 12/10/2011   Erectile dysfunction 12/10/2011   Past Medical History:  Diagnosis Date   Allergy    Arthritis    Basal cell carcinoma    Erectile dysfunction    Hyperlipidemia    Hypertension    Past Surgical History:  Procedure Laterality Date   cataract Bilateral    KNEE SURGERY     LUMBAR DISC SURGERY     SPINE SURGERY     No Known Allergies Prior to Admission medications   Medication Sig Start Date End Date Taking? Authorizing Provider  albuterol (VENTOLIN HFA) 108 (90 Base) MCG/ACT inhaler INHALE 1-2 PUFFS INTO THE LUNGS EVERY 4 HOURS AS NEEDED FOR WHEEZING OR SHORTNESS OF BREATH. 01/15/21  Yes Janeece Agee, NP  Ascorbic Acid (VITAMIN C) 100 MG tablet Take 100 mg by mouth daily.    Yes [provider]  Azelastine-Fluticasone 137-50 MCG/ACT SUSP PLACE 1 SPRAY INTO THE NOSE EVERY 12 (TWELVE) HOURS. 06/20/22  Yes Shade Flood, MD  celecoxib (CELEBREX) 50 MG capsule Take 50 mg by mouth 2 (two) times daily. Pt does not know strength   Yes [provider]  fluticasone-salmeterol (ADVAIR HFA) 115-21 MCG/ACT inhaler Inhale 2 puffs into the lungs 2 (two) times daily. 12/31/21  Yes Shade Flood, MD  gatifloxacin (ZYMAXID) 0.5 % SOLN Place 1 drop into the left eye 4 (four) times daily. 04/26/22  Yes [provider]  glucosamine-chondroitin 500-400 MG tablet Take 1 tablet by mouth 3 (three) times daily.   Yes [provider]  ipratropium (ATROVENT) 0.06 % nasal spray Place 1-2 sprays into both nostrils 4 (four) times daily. As needed for nasal congestion  12/31/21  Yes Shade Flood, MD  lisinopril-hydrochlorothiazide (ZESTORETIC) 20-25 MG tablet TAKE 1 TABLET BY MOUTH EVERY DAY 06/23/22  Yes Shade Flood, MD  loratadine (CLARITIN) 10 MG tablet TAKE 1 TABLET BY MOUTH EVERYDAY AT BEDTIME 06/24/22  Yes Shade Flood, MD  pravastatin (PRAVACHOL) 80 MG tablet Take 1 tablet (80 mg total) by mouth daily. 12/31/21  Yes Shade Flood, MD  amoxicillin-clavulanate (AUGMENTIN) 875-125 MG tablet Take 1 tablet by mouth 2 (two) times daily. Patient not taking: Reported on 08/15/2022 07/25/22   Shade Flood, MD  ketorolac (ACULAR) 0.5 % ophthalmic solution Place 1 drop into the left eye 4 (four) times daily. Patient not taking: Reported on 07/08/2022 04/26/22   [provider]  loratadine (CLARITIN) 10 MG tablet Take by mouth. Patient not taking: Reported on 07/08/2022 06/30/21   [provider]   Social History   Socioeconomic History   Marital status: Married    Spouse name: Not on file   Number of children: Not on file   Years of education: Not on file   Highest education level: Associate degree: occupational, Scientist, product/process development, or vocational program  Occupational History   Occupation: Radiation protection practitioner: [employer]  Tobacco Use   Smoking status: Former   Smokeless tobacco: Never  Substance and Sexual Activity   Alcohol use: No   Drug use: No   Sexual activity: Yes  Other Topics Concern   Not on file  Social History Narrative   Married. Education: [employer]. Exercise: Walk 2 times a week for 45 minutes.   Social Determinants of Health   Financial Resource Strain: Low Risk  (07/07/2022)   Overall Financial Resource Strain (CARDIA)    Difficulty of Paying Living Expenses: Not hard at all  Food Insecurity: No Food Insecurity (07/07/2022)   Hunger Vital Sign    Worried About Running Out of Food in the Last Year: Never true    Ran Out of Food in the Last Year: Never true  Transportation Needs: No Transportation Needs  (07/07/2022)   PRAPARE - Administrator, Civil Service (Medical): No    Lack of Transportation (Non-Medical): No  Physical Activity: Insufficiently Active (07/07/2022)   Exercise Vital Sign    Days of Exercise per Week: 1 day    Minutes of Exercise per Session: 10 min  Stress: No Stress Concern Present (07/07/2022)   [employer] of Occupational Health - Occupational Stress Questionnaire    Feeling of Stress : Not at all  Social Connections: Moderately Isolated (07/07/2022)   Social Connection and Isolation Panel [NHANES]    Frequency of Communication with Friends and Family: Three times a week    Frequency of Social Gatherings with Friends and Family: Once a week    Attends Religious Services: Never    Database administrator or Organizations: No    Attends Engineer, structural: Not on file    Marital Status: Married  Intimate  Partner Violence: Unknown (08/31/2021)   Received from [facility]   HITS    Physically Hurt: Not on file    Insult or Talk Down To: Not on file    Threaten Physical Harm: Not on file    Scream or Curse: Not on file    Observations/Objective: There were no vitals filed for this visit. Nontoxic appearance on video, speaking full sentences without distress or respiratory distress.  Appropriate responses, all questions answered with understanding of plan expressed  Assessment and Plan: Hyperlipidemia, unspecified hyperlipidemia type - Plan: Comprehensive metabolic panel, Lipid panel  Myalgia - Plan: Comprehensive metabolic panel, Lipid panel Improved myalgias on statin although not completely resolved.  Possibly multifactorial.  Knee pain has improved with injections, some leg symptoms could be due to increased activity/exercises after injections but did notice some improvement off statin.  Has increased frequency of pravastatin 80 mg since restarting, now at every other day dosing and tolerating current regimen without significant change in  myalgias.  Continue same for now with lab only visit in 3 to 4 weeks.  Depending on those results consider other statin versus continued treatment with current regimen.  2-month in office follow-up.  Follow Up Instructions: As above   I discussed the assessment and treatment plan with the patient. The patient was provided an opportunity to ask questions and all were answered. The patient agreed with the plan and demonstrated an understanding of the instructions.   The patient was advised to call back or seek an in-person evaluation if the symptoms worsen or if the condition fails to improve as anticipated.   Shade Flood, MD

## 2022-08-15 NOTE — Patient Instructions (Addendum)
Good talking to you today.  I am glad to hear the muscle aches have improved.  Okay to stay at every other day dosing of pravastatin for now with a lab only visit at [facility] lab below in the next 3 to 4 weeks.  Depending on those results we can decide to continue the same regimen or switch to a different cholesterol medicine to see if that would be tolerated better and more effective.  Again no changes for now.  I am also glad to hear that your knees have improved, please keep me posted and let me know if there are any questions.  [facility] or xray: Walk in 8:30-4:30 during weekdays, no appointment needed [address]  [place], [place] [address]

## 2022-08-16 NOTE — Progress Notes (Signed)
Patient has been scheduled

## 2022-09-02 ENCOUNTER — Encounter: Payer: Self-pay | Admitting: Family Medicine

## 2022-09-02 ENCOUNTER — Telehealth: Payer: BC Managed Care – PPO | Admitting: Family Medicine

## 2022-09-02 DIAGNOSIS — J309 Allergic rhinitis, unspecified: Secondary | ICD-10-CM

## 2022-09-02 DIAGNOSIS — R42 Dizziness and giddiness: Secondary | ICD-10-CM

## 2022-09-02 DIAGNOSIS — R0981 Nasal congestion: Secondary | ICD-10-CM

## 2022-09-02 NOTE — Patient Instructions (Signed)
Sinus congestion/pressure was likely the cause for your episode of dizziness this morning.  Glad to hear that has improved.  Does not sound like a bacterial infection at this time, okay to try the Atrovent up to 4 times per day, Afrin for a day or 2 at the most if needed.  Saline nasal spray over-the-counter throughout the day can help with congestion/pressure as well.  Make sure to drink plenty of fluids.  No change in allergy meds for now.  Give me an update in the next 4 to 5 days and if not improving at that time could consider antibiotic, be seen sooner if any worsening symptoms.  Take care.

## 2022-09-02 NOTE — Progress Notes (Signed)
Virtual Visit via Video Note  I connected with Joshua Levine on 09/02/22 at 10:23 AM by a video enabled telemedicine application and verified that I am speaking with the correct person using two identifiers.  Patient location: home - by self My location: office - [place].    I discussed the limitations, risks, security and privacy concerns of performing an evaluation and management service by telephone and the availability of in person appointments. I also discussed with the patient that there may be a patient responsible charge related to this service. The patient expressed understanding and agreed to proceed, consent obtained  Chief complaint:  Chief Complaint  Patient presents with   Sinus Problem    Pt notes has had pain on the Rt side of face near cheek bone, and some pain into back molars, notes dentist looked at these teeth and found nothing at that time, sinus congestion and drainage, sneezing a lot, denied fever, after waking this morning had trouble with balance which is new this morning,     History of Present Illness: Joshua Levine is a 72 y.o. male  Sinus pain/congestion As above. Past few days. Twinges of pain prior.  Sinus pain on the right face, near his cheekbone, pain into the back molars.  Had dental evaluation  2 days ago - without apparent dental cause with exam and XR.  Possible soreness from replacement crown from a few months ago.   Some increased congestion, drainage  not discolored. sneezing but no fever.  Somewhat off balance this morning - for about an hour after wakening, resolved now. No chest pain, slight HA with facial pain - better. No focal weakness, facial droop or slurred speech. Improved pain with drainage improvement. No body aches. No covid testing.   Tx: atrovent NS- has helped with congestion and pain. Has used 2 times per day. Possible mouth sore with increased use in past.  Afrin - few uses - last used 2 days ago. That also helped.   Azelastine/fluticasone - using BID consistently. Taking claritin.   Treated with Augmentin for subacute sinusitis in June. Had resolved after abx.   Patient Active Problem List   Diagnosis Date Noted   Family history of factor V Leiden mutation 11/06/2019   Essential hypertension 12/10/2011   Hyperlipidemia 12/10/2011   Erectile dysfunction 12/10/2011   Past Medical History:  Diagnosis Date   Allergy    Arthritis    Basal cell carcinoma    Erectile dysfunction    Hyperlipidemia    Hypertension    Past Surgical History:  Procedure Laterality Date   cataract Bilateral    KNEE SURGERY     LUMBAR DISC SURGERY     SPINE SURGERY     No Known Allergies Prior to Admission medications   Medication Sig Start Date End Date Taking? Authorizing Provider  albuterol (VENTOLIN HFA) 108 (90 Base) MCG/ACT inhaler INHALE 1-2 PUFFS INTO THE LUNGS EVERY 4 HOURS AS NEEDED FOR WHEEZING OR SHORTNESS OF BREATH. 01/15/21   Janeece Agee, NP  amoxicillin-clavulanate (AUGMENTIN) 875-125 MG tablet Take 1 tablet by mouth 2 (two) times daily. Patient not taking: Reported on 08/15/2022 07/25/22   Shade Flood, MD  Ascorbic Acid (VITAMIN C) 100 MG tablet Take 100 mg by mouth daily.     [provider]  Azelastine-Fluticasone 137-50 MCG/ACT SUSP PLACE 1 SPRAY INTO THE NOSE EVERY 12 (TWELVE) HOURS. 06/20/22   Shade Flood, MD  celecoxib (CELEBREX) 50 MG capsule Take 50 mg by  mouth 2 (two) times daily. Pt does not know strength    [provider]  fluticasone-salmeterol (ADVAIR HFA) 115-21 MCG/ACT inhaler Inhale 2 puffs into the lungs 2 (two) times daily. 12/31/21   Shade Flood, MD  gatifloxacin (ZYMAXID) 0.5 % SOLN Place 1 drop into the left eye 4 (four) times daily. 04/26/22   [provider]  glucosamine-chondroitin 500-400 MG tablet Take 1 tablet by mouth 3 (three) times daily.    [provider]  ipratropium (ATROVENT) 0.06 % nasal spray Place 1-2 sprays  into both nostrils 4 (four) times daily. As needed for nasal congestion 12/31/21   Shade Flood, MD  ketorolac (ACULAR) 0.5 % ophthalmic solution Place 1 drop into the left eye 4 (four) times daily. Patient not taking: Reported on 07/08/2022 04/26/22   [provider]  lisinopril-hydrochlorothiazide (ZESTORETIC) 20-25 MG tablet TAKE 1 TABLET BY MOUTH EVERY DAY 06/23/22   Shade Flood, MD  loratadine (CLARITIN) 10 MG tablet Take by mouth. Patient not taking: Reported on 07/08/2022 06/30/21   [provider]  loratadine (CLARITIN) 10 MG tablet TAKE 1 TABLET BY MOUTH EVERYDAY AT BEDTIME 06/24/22   Shade Flood, MD  pravastatin (PRAVACHOL) 80 MG tablet Take 1 tablet (80 mg total) by mouth daily. 12/31/21   Shade Flood, MD   Social History   Socioeconomic History   Marital status: Married    Spouse name: Not on file   Number of children: Not on file   Years of education: Not on file   Highest education level: Associate degree: occupational, Scientist, product/process development, or vocational program  Occupational History   Occupation: Radiation protection practitioner: [employer]  Tobacco Use   Smoking status: Former   Smokeless tobacco: Never  Substance and Sexual Activity   Alcohol use: No   Drug use: No   Sexual activity: Yes  Other Topics Concern   Not on file  Social History Narrative   Married. Education: [employer]. Exercise: Walk 2 times a week for 45 minutes.   Social Determinants of Health   Financial Resource Strain: Low Risk  (07/07/2022)   Overall Financial Resource Strain (CARDIA)    Difficulty of Paying Living Expenses: Not hard at all  Food Insecurity: No Food Insecurity (07/07/2022)   Hunger Vital Sign    Worried About Running Out of Food in the Last Year: Never true    Ran Out of Food in the Last Year: Never true  Transportation Needs: No Transportation Needs (07/07/2022)   PRAPARE - Administrator, Civil Service (Medical): No    Lack of Transportation (Non-Medical):  No  Physical Activity: Insufficiently Active (07/07/2022)   Exercise Vital Sign    Days of Exercise per Week: 1 day    Minutes of Exercise per Session: 10 min  Stress: No Stress Concern Present (07/07/2022)   [employer] of Occupational Health - Occupational Stress Questionnaire    Feeling of Stress : Not at all  Social Connections: Moderately Isolated (07/07/2022)   Social Connection and Isolation Panel [NHANES]    Frequency of Communication with Friends and Family: Three times a week    Frequency of Social Gatherings with Friends and Family: Once a week    Attends Religious Services: Never    Database administrator or Organizations: No    Attends Banker Meetings: Not on file    Marital Status: Married  Intimate Partner Violence: Unknown (08/31/2021)   Received from [facility]  HITS    Physically Hurt: Not on file    Insult or Talk Down To: Not on file    Threaten Physical Harm: Not on file    Scream or Curse: Not on file    Observations/Objective: There were no vitals filed for this visit. Nontoxic appearance on video, speaking in full sentences, no respiratory distress.  No facial weakness, no facial droop.  All questions answered with understanding of plan expressed  Assessment and Plan: Sinus congestion  Allergic rhinitis, unspecified seasonality, unspecified trigger  Episode of dizziness Increase sinus congestion, pressure past few days.  Flare of allergies versus viral syndrome.  Option of COVID testing at home discussed.  Relief with Atrovent nasal spray or Afrin nasal spray, less likely acute bacterial sinusitis, more likely sinus pressure, congestion with underlying allergies.  He has been compliant with azelastine fluticasone nasal spray, option to increase Atrovent up to 4 times daily for now as well as saline nasal spray.  Short-term Afrin if needed for acute pain/pressure but do not exceed more than 2 days use.  Without discolored nasal discharge and  based on timing unlikely acute bacterial cause and decided against antibiotics at this time.  If symptoms or not improving in the next 3 to 4 days, advised to call and I would consider an antibiotic and at that time.   Episode of dizziness was likely related to pain and sinus/pressure with possible eustachian tube dysfunction/middle ear involvement.  Resolution of symptoms currently, no change in plan above.  ER/urgent care precautions given.  Follow Up Instructions: As needed   Patient Instructions  Sinus congestion/pressure was likely the cause for your episode of dizziness this morning.  Glad to hear that has improved.  Does not sound like a bacterial infection at this time, okay to try the Atrovent up to 4 times per day, Afrin for a day or 2 at the most if needed.  Saline nasal spray over-the-counter throughout the day can help with congestion/pressure as well.  Make sure to drink plenty of fluids.  No change in allergy meds for now.  Give me an update in the next 4 to 5 days and if not improving at that time could consider antibiotic, be seen sooner if any worsening symptoms.  Take care.   I discussed the assessment and treatment plan with the patient. The patient was provided an opportunity to ask questions and all were answered. The patient agreed with the plan and demonstrated an understanding of the instructions.   The patient was advised to call back or seek an in-person evaluation if the symptoms worsen or if the condition fails to improve as anticipated.   Shade Flood, MD

## 2022-09-03 ENCOUNTER — Other Ambulatory Visit: Payer: Self-pay | Admitting: Family Medicine

## 2022-09-03 DIAGNOSIS — E785 Hyperlipidemia, unspecified: Secondary | ICD-10-CM

## 2022-10-21 ENCOUNTER — Other Ambulatory Visit: Payer: Self-pay | Admitting: Family Medicine

## 2022-10-21 DIAGNOSIS — J309 Allergic rhinitis, unspecified: Secondary | ICD-10-CM

## 2022-10-27 DIAGNOSIS — M25561 Pain in right knee: Secondary | ICD-10-CM | POA: Diagnosis not present

## 2022-10-27 DIAGNOSIS — M25562 Pain in left knee: Secondary | ICD-10-CM | POA: Diagnosis not present

## 2022-12-16 ENCOUNTER — Encounter: Payer: Self-pay | Admitting: Family Medicine

## 2022-12-16 ENCOUNTER — Telehealth: Payer: BC Managed Care – PPO | Admitting: Family Medicine

## 2022-12-16 DIAGNOSIS — J019 Acute sinusitis, unspecified: Secondary | ICD-10-CM

## 2022-12-16 MED ORDER — AMOXICILLIN-POT CLAVULANATE 875-125 MG PO TABS
1.0000 | ORAL_TABLET | Freq: Two times a day (BID) | ORAL | 0 refills | Status: DC
Start: 2022-12-16 — End: 2022-12-27

## 2022-12-16 NOTE — Progress Notes (Signed)
Virtual Visit via Video Note  I connected with [name] on 12/16/22 at 10:30 AM by a video enabled telemedicine application and verified that I am speaking with the correct person using two identifiers.  Patient location: office - private area.  [place] location: office - [place].    I discussed the limitations, risks, security and privacy concerns of performing an evaluation and management service by telephone and the availability of in person appointments. I also discussed with the patient that there may be a patient responsible charge related to this service. The patient expressed understanding and agreed to proceed, consent obtained  Chief complaint:  Chief Complaint  Patient presents with   Nasal Congestion    Pt notes congestion and sinus drainage, notes negative COVID test (started 3.5 weeks ago) still having issue with sinus drainage notes discolored green and very thick and notes a bad taste and smell to this     History of Present Illness: Joshua Levine is a 72 y.o. male  Nasal congestion, sinus drainage Past 3.5 weeks. Cough, congestion, mild fever. Chest congestion and sinus congestion.  Negative COVID testing. Was feeling like his symptoms were improving until 5 days ago- left sided green, thick nasal discharge, bad taste and smell. No chest pain/wheezing/dyspnea. Min cough during day. Productive cough usually in am. Minimal during day - improved.   No fever, face pain, HA.  Tmax 99 few days ago.   History of allergic rhinitis treated with Atrovent nasal spray, azelastine, fluticasone previously.  Discussed in August, and without discoloration or apparent sign of bacterial sinusitis at that time, deferred antibiotics.He was treated with Augmentin in June for subacute sinusitis at that time. Patient Active Problem List   Diagnosis Date Noted   Family history of factor V Leiden mutation 11/06/2019   Essential hypertension 12/10/2011   Hyperlipidemia 12/10/2011    Erectile dysfunction 12/10/2011   Past Medical History:  Diagnosis Date   Allergy    Arthritis    Basal cell carcinoma    Erectile dysfunction    Hyperlipidemia    Hypertension    Past Surgical History:  Procedure Laterality Date   cataract Bilateral    KNEE SURGERY     LUMBAR DISC SURGERY     SPINE SURGERY     No Known Allergies Prior to Admission medications   Medication Sig Start Date End Date Taking? Authorizing Provider  albuterol (VENTOLIN HFA) 108 (90 Base) MCG/ACT inhaler INHALE 1-2 PUFFS INTO THE LUNGS EVERY 4 HOURS AS NEEDED FOR WHEEZING OR SHORTNESS OF BREATH. 01/15/21  Yes Janeece Agee, NP  Ascorbic Acid (VITAMIN C) 100 MG tablet Take 100 mg by mouth daily.    Yes [provider]  Azelastine-Fluticasone 137-50 MCG/ACT SUSP PLACE 1 SPRAY INTO THE NOSE EVERY 12 (TWELVE) HOURS. 10/21/22  Yes Shade Flood, MD  celecoxib (CELEBREX) 50 MG capsule Take 50 mg by mouth 2 (two) times daily. Pt does not know strength   Yes [provider]  fluticasone-salmeterol (ADVAIR HFA) 115-21 MCG/ACT inhaler Inhale 2 puffs into the lungs 2 (two) times daily. 12/31/21  Yes Shade Flood, MD  gatifloxacin (ZYMAXID) 0.5 % SOLN Place 1 drop into the left eye 4 (four) times daily. 04/26/22  Yes [provider]  glucosamine-chondroitin 500-400 MG tablet Take 1 tablet by mouth 3 (three) times daily.   Yes [provider]  ipratropium (ATROVENT) 0.06 % nasal spray Place 1-2 sprays into both nostrils 4 (four) times daily. As needed for nasal congestion  12/31/21  Yes Shade Flood, MD  lisinopril-hydrochlorothiazide (ZESTORETIC) 20-25 MG tablet TAKE 1 TABLET BY MOUTH EVERY DAY 06/23/22  Yes Shade Flood, MD  loratadine (CLARITIN) 10 MG tablet TAKE 1 TABLET BY MOUTH EVERYDAY AT BEDTIME 06/24/22  Yes Shade Flood, MD  pravastatin (PRAVACHOL) 80 MG tablet TAKE 1 TABLET BY MOUTH EVERY DAY 09/05/22  Yes Shade Flood, MD   Social History    Socioeconomic History   Marital status: Married    Spouse name: Not on file   Number of children: Not on file   Years of education: Not on file   Highest education level: Associate degree: occupational, Scientist, product/process development, or vocational program  Occupational History   Occupation: Radiation protection practitioner: [employer]  Tobacco Use   Smoking status: Former   Smokeless tobacco: Never  Substance and Sexual Activity   Alcohol use: No   Drug use: No   Sexual activity: Yes  Other Topics Concern   Not on file  Social History Narrative   Married. Education: [employer]. Exercise: Walk 2 times a week for 45 minutes.   Social Determinants of Health   Financial Resource Strain: Low Risk  (07/07/2022)   Overall Financial Resource Strain (CARDIA)    Difficulty of Paying Living Expenses: Not hard at all  Food Insecurity: No Food Insecurity (07/07/2022)   Hunger Vital Sign    Worried About Running Out of Food in the Last Year: Never true    Ran Out of Food in the Last Year: Never true  Transportation Needs: No Transportation Needs (07/07/2022)   PRAPARE - Administrator, Civil Service (Medical): No    Lack of Transportation (Non-Medical): No  Physical Activity: Insufficiently Active (07/07/2022)   Exercise Vital Sign    Days of Exercise per Week: 1 day    Minutes of Exercise per Session: 10 min  Stress: No Stress Concern Present (07/07/2022)   [employer] of Occupational Health - Occupational Stress Questionnaire    Feeling of Stress : Not at all  Social Connections: Moderately Isolated (07/07/2022)   Social Connection and Isolation Panel [NHANES]    Frequency of Communication with Friends and Family: Three times a week    Frequency of Social Gatherings with Friends and Family: Once a week    Attends Religious Services: Never    Database administrator or Organizations: No    Attends Engineer, structural: Not on file    Marital Status: Married  Intimate Partner Violence: Unknown  (08/31/2021)   Received from [employer], [facility]   HITS    Physically Hurt: Not on file    Insult or Talk Down To: Not on file    Threaten Physical Harm: Not on file    Scream or Curse: Not on file    Observations/Objective: There were no vitals filed for this visit. Nontoxic appearance on video.  Equal facial movements.  No audible stridor or wheeze.  Speaking in full sentences without respiratory distress.  No cough heard during video visit.  Appropriate responses.  All questions answered with understanding of plan expressed.  Assessment and Plan: Acute sinusitis, recurrence not specified, unspecified location - Plan: amoxicillin-clavulanate (AUGMENTIN) 875-125 MG tablet Likely initial viral illness past few weeks that has been improving with secondary sickening, sinusitis this week.  Started Augmentin.  Has tolerated previously, potential side effects discussed.  Saline nasal spray, other symptomatic care for sinus infection including fluids and rest with RTC precautions given.  If he does have recurrent sinus infections I would recommend ENT evaluation but will hold on that referral for now.  Follow Up Instructions: Patient Instructions  You likely did have a virus infection initially, and glad to hear that was improving.  Recent symptoms this week are likely a bacterial sinus infection or what we call secondary sickening.  Start Augmentin to treat the infection, make sure to drink plenty of fluids, saline nasal spray can help with congestion.  See other information below.  If you have fevers, shortness of breath or worsening symptoms be seen but I do not expect that to occur.  Take care!  Sinus Infection, Adult A sinus infection, also called sinusitis, is inflammation of your sinuses. Sinuses are hollow spaces in the bones around your face. Your sinuses are located: Around your eyes. In the middle of your forehead. Behind your nose. In your cheekbones. Mucus normally drains out  of your sinuses. When your nasal tissues become inflamed or swollen, mucus can become trapped or blocked. This allows bacteria, viruses, and fungi to grow, which leads to infection. Most infections of the sinuses are caused by a virus. A sinus infection can develop quickly. It can last for up to 4 weeks (acute) or for more than 12 weeks (chronic). A sinus infection often develops after a cold. What are the causes? This condition is caused by anything that creates swelling in the sinuses or stops mucus from draining. This includes: Allergies. Asthma. Infection from bacteria or viruses. Deformities or blockages in your nose or sinuses. Abnormal growths in the nose (nasal polyps). Pollutants, such as chemicals or irritants in the air. Infection from fungi. This is rare. What increases the risk? You are more likely to develop this condition if you: Have a weak body defense system (immune system). Do a lot of swimming or diving. Overuse nasal sprays. Smoke. What are the signs or symptoms? The main symptoms of this condition are pain and a feeling of pressure around the affected sinuses. Other symptoms include: Stuffy nose or congestion that makes it difficult to breathe through your nose. Thick yellow or greenish drainage from your nose. Tenderness, swelling, and warmth over the affected sinuses. A cough that may get worse at night. Decreased sense of smell and taste. Extra mucus that collects in the throat or the back of the nose (postnasal drip) causing a sore throat or bad breath. Tiredness (fatigue). Fever. How is this diagnosed? This condition is diagnosed based on: Your symptoms. Your medical history. A physical exam. Tests to find out if your condition is acute or chronic. This may include: Checking your nose for nasal polyps. Viewing your sinuses using a device that has a light (endoscope). Testing for allergies or bacteria. Imaging tests, such as an MRI or CT scan. In rare  cases, a bone biopsy may be done to rule out more serious types of fungal sinus disease. How is this treated? Treatment for a sinus infection depends on the cause and whether your condition is chronic or acute. If caused by a virus, your symptoms should go away on their own within 10 days. You may be given medicines to relieve symptoms. They include: Medicines that shrink swollen nasal passages (decongestants). A spray that eases inflammation of the nostrils (topical intranasal corticosteroids). Rinses that help get rid of thick mucus in your nose (nasal saline washes). Medicines that treat allergies (antihistamines). Over-the-counter pain relievers. If caused by bacteria, your health care provider may recommend waiting to see if your symptoms  improve. Most bacterial infections will get better without antibiotic medicine. You may be given antibiotics if you have: A severe infection. A weak immune system. If caused by narrow nasal passages or nasal polyps, surgery may be needed. Follow these instructions at home: Medicines Take, use, or apply over-the-counter and prescription medicines only as told by your health care provider. These may include nasal sprays. If you were prescribed an antibiotic medicine, take it as told by your health care provider. Do not stop taking the antibiotic even if you start to feel better. Hydrate and humidify  Drink enough fluid to keep your urine pale yellow. Staying hydrated will help to thin your mucus. Use a cool mist humidifier to keep the humidity level in your home above 50%. Inhale steam for 10-15 minutes, 3-4 times a day, or as told by your health care provider. You can do this in the bathroom while a hot shower is running. Limit your exposure to cool or dry air. Rest Rest as much as possible. Sleep with your head raised (elevated). Make sure you get enough sleep each night. General instructions  Apply a warm, moist washcloth to your face 3-4 times a  day or as told by your health care provider. This will help with discomfort. Use nasal saline washes as often as told by your health care provider. Wash your hands often with soap and water to reduce your exposure to germs. If soap and water are not available, use hand sanitizer. Do not smoke. Avoid being around people who are smoking (secondhand smoke). Keep all follow-up visits. This is important. Contact a health care provider if: You have a fever. Your symptoms get worse. Your symptoms do not improve within 10 days. Get help right away if: You have a severe headache. You have persistent vomiting. You have severe pain or swelling around your face or eyes. You have vision problems. You develop confusion. Your neck is stiff. You have trouble breathing. These symptoms may be an emergency. Get help right away. Call 911. Do not wait to see if the symptoms will go away. Do not drive yourself to the hospital. Summary A sinus infection is soreness and inflammation of your sinuses. Sinuses are hollow spaces in the bones around your face. This condition is caused by nasal tissues that become inflamed or swollen. The swelling traps or blocks the flow of mucus. This allows bacteria, viruses, and fungi to grow, which leads to infection. If you were prescribed an antibiotic medicine, take it as told by your health care provider. Do not stop taking the antibiotic even if you start to feel better. Keep all follow-up visits. This is important. This information is not intended to replace advice given to you by your health care provider. Make sure you discuss any questions you have with your health care provider. Document Revised: 12/22/2020 Document Reviewed: 12/22/2020 Elsevier Patient Education  2024 Elsevier Inc.     I discussed the assessment and treatment plan with the patient. The patient was provided an opportunity to ask questions and all were answered. The patient agreed with the plan and  demonstrated an understanding of the instructions.   The patient was advised to call back or seek an in-person evaluation if the symptoms worsen or if the condition fails to improve as anticipated.   Shade Flood, MD

## 2022-12-16 NOTE — Patient Instructions (Addendum)
You likely did have a virus infection initially, and glad to hear that was improving.  Recent symptoms this week are likely a bacterial sinus infection or what we call secondary sickening.  Start Augmentin to treat the infection, make sure to drink plenty of fluids, saline nasal spray can help with congestion.  See other information below.  If you have fevers, shortness of breath or worsening symptoms be seen but I do not expect that to occur.  Take care!  Sinus Infection, Adult A sinus infection, also called sinusitis, is inflammation of your sinuses. Sinuses are hollow spaces in the bones around your face. Your sinuses are located: Around your eyes. In the middle of your forehead. Behind your nose. In your cheekbones. Mucus normally drains out of your sinuses. When your nasal tissues become inflamed or swollen, mucus can become trapped or blocked. This allows bacteria, viruses, and fungi to grow, which leads to infection. Most infections of the sinuses are caused by a virus. A sinus infection can develop quickly. It can last for up to 4 weeks (acute) or for more than 12 weeks (chronic). A sinus infection often develops after a cold. What are the causes? This condition is caused by anything that creates swelling in the sinuses or stops mucus from draining. This includes: Allergies. Asthma. Infection from bacteria or viruses. Deformities or blockages in your nose or sinuses. Abnormal growths in the nose (nasal polyps). Pollutants, such as chemicals or irritants in the air. Infection from fungi. This is rare. What increases the risk? You are more likely to develop this condition if you: Have a weak body defense system (immune system). Do a lot of swimming or diving. Overuse nasal sprays. Smoke. What are the signs or symptoms? The main symptoms of this condition are pain and a feeling of pressure around the affected sinuses. Other symptoms include: Stuffy nose or congestion that makes it  difficult to breathe through your nose. Thick yellow or greenish drainage from your nose. Tenderness, swelling, and warmth over the affected sinuses. A cough that may get worse at night. Decreased sense of smell and taste. Extra mucus that collects in the throat or the back of the nose (postnasal drip) causing a sore throat or bad breath. Tiredness (fatigue). Fever. How is this diagnosed? This condition is diagnosed based on: Your symptoms. Your medical history. A physical exam. Tests to find out if your condition is acute or chronic. This may include: Checking your nose for nasal polyps. Viewing your sinuses using a device that has a light (endoscope). Testing for allergies or bacteria. Imaging tests, such as an MRI or CT scan. In rare cases, a bone biopsy may be done to rule out more serious types of fungal sinus disease. How is this treated? Treatment for a sinus infection depends on the cause and whether your condition is chronic or acute. If caused by a virus, your symptoms should go away on their own within 10 days. You may be given medicines to relieve symptoms. They include: Medicines that shrink swollen nasal passages (decongestants). A spray that eases inflammation of the nostrils (topical intranasal corticosteroids). Rinses that help get rid of thick mucus in your nose (nasal saline washes). Medicines that treat allergies (antihistamines). Over-the-counter pain relievers. If caused by bacteria, your health care provider may recommend waiting to see if your symptoms improve. Most bacterial infections will get better without antibiotic medicine. You may be given antibiotics if you have: A severe infection. A weak immune system. If caused by  narrow nasal passages or nasal polyps, surgery may be needed. Follow these instructions at home: Medicines Take, use, or apply over-the-counter and prescription medicines only as told by your health care provider. These may include nasal  sprays. If you were prescribed an antibiotic medicine, take it as told by your health care provider. Do not stop taking the antibiotic even if you start to feel better. Hydrate and humidify  Drink enough fluid to keep your urine pale yellow. Staying hydrated will help to thin your mucus. Use a cool mist humidifier to keep the humidity level in your home above 50%. Inhale steam for 10-15 minutes, 3-4 times a day, or as told by your health care provider. You can do this in the bathroom while a hot shower is running. Limit your exposure to cool or dry air. Rest Rest as much as possible. Sleep with your head raised (elevated). Make sure you get enough sleep each night. General instructions  Apply a warm, moist washcloth to your face 3-4 times a day or as told by your health care provider. This will help with discomfort. Use nasal saline washes as often as told by your health care provider. Wash your hands often with soap and water to reduce your exposure to germs. If soap and water are not available, use hand sanitizer. Do not smoke. Avoid being around people who are smoking (secondhand smoke). Keep all follow-up visits. This is important. Contact a health care provider if: You have a fever. Your symptoms get worse. Your symptoms do not improve within 10 days. Get help right away if: You have a severe headache. You have persistent vomiting. You have severe pain or swelling around your face or eyes. You have vision problems. You develop confusion. Your neck is stiff. You have trouble breathing. These symptoms may be an emergency. Get help right away. Call 911. Do not wait to see if the symptoms will go away. Do not drive yourself to the hospital. Summary A sinus infection is soreness and inflammation of your sinuses. Sinuses are hollow spaces in the bones around your face. This condition is caused by nasal tissues that become inflamed or swollen. The swelling traps or blocks the flow  of mucus. This allows bacteria, viruses, and fungi to grow, which leads to infection. If you were prescribed an antibiotic medicine, take it as told by your health care provider. Do not stop taking the antibiotic even if you start to feel better. Keep all follow-up visits. This is important. This information is not intended to replace advice given to you by your health care provider. Make sure you discuss any questions you have with your health care provider. Document Revised: 12/22/2020 Document Reviewed: 12/22/2020 Elsevier Patient Education  2024 [employer].

## 2022-12-20 ENCOUNTER — Other Ambulatory Visit: Payer: Self-pay | Admitting: Family Medicine

## 2022-12-20 DIAGNOSIS — I10 Essential (primary) hypertension: Secondary | ICD-10-CM

## 2022-12-21 ENCOUNTER — Encounter: Payer: Self-pay | Admitting: Family Medicine

## 2022-12-21 DIAGNOSIS — J019 Acute sinusitis, unspecified: Secondary | ICD-10-CM

## 2022-12-22 ENCOUNTER — Other Ambulatory Visit: Payer: Self-pay | Admitting: Family Medicine

## 2022-12-22 DIAGNOSIS — E785 Hyperlipidemia, unspecified: Secondary | ICD-10-CM

## 2022-12-26 NOTE — Telephone Encounter (Signed)
Patient reports continued drainage please advise

## 2022-12-27 MED ORDER — AMOXICILLIN-POT CLAVULANATE 875-125 MG PO TABS: 1.0000 | ORAL_TABLET | Freq: Two times a day (BID) | ORAL | 0 refills | Status: DC

## 2022-12-27 NOTE — Addendum Note (Signed)
Addended by: Meredith Staggers R on: 12/27/2022 09:51 AM   Modules accepted: Orders

## 2023-01-14 ENCOUNTER — Other Ambulatory Visit: Payer: Self-pay | Admitting: Family Medicine

## 2023-01-14 DIAGNOSIS — J309 Allergic rhinitis, unspecified: Secondary | ICD-10-CM

## 2023-01-16 ENCOUNTER — Ambulatory Visit: Payer: BC Managed Care – PPO | Admitting: Family Medicine

## 2023-01-16 ENCOUNTER — Encounter: Payer: Self-pay | Admitting: Family Medicine

## 2023-01-16 VITALS — BP 132/78 | HR 70 | Temp 98.2°F | Ht 75.0 in | Wt 245.4 lb

## 2023-01-16 DIAGNOSIS — R7303 Prediabetes: Secondary | ICD-10-CM

## 2023-01-16 DIAGNOSIS — J45909 Unspecified asthma, uncomplicated: Secondary | ICD-10-CM

## 2023-01-16 DIAGNOSIS — E785 Hyperlipidemia, unspecified: Secondary | ICD-10-CM | POA: Diagnosis not present

## 2023-01-16 DIAGNOSIS — J329 Chronic sinusitis, unspecified: Secondary | ICD-10-CM | POA: Diagnosis not present

## 2023-01-16 DIAGNOSIS — I10 Essential (primary) hypertension: Secondary | ICD-10-CM | POA: Diagnosis not present

## 2023-01-16 LAB — COMPREHENSIVE METABOLIC PANEL
ALT: 38 U/L (ref 0–53)
AST: 28 U/L (ref 0–37)
Albumin: 4.7 g/dL (ref 3.5–5.2)
Alkaline Phosphatase: 62 U/L (ref 39–117)
BUN: 25 mg/dL — ABNORMAL HIGH (ref 6–23)
CO2: 25 meq/L (ref 19–32)
Calcium: 10.1 mg/dL (ref 8.4–10.5)
Chloride: 103 meq/L (ref 96–112)
Creatinine, Ser: 1.01 mg/dL (ref 0.40–1.50)
GFR: 75.11 mL/min (ref >60.00)
Glucose, Bld: 105 mg/dL — ABNORMAL HIGH (ref 70–99)
Potassium: 4.5 meq/L (ref 3.5–5.1)
Sodium: 138 meq/L (ref 135–145)
Total Bilirubin: 0.3 mg/dL (ref 0.2–1.2)
Total Protein: 7.7 g/dL (ref 6.0–8.3)

## 2023-01-16 LAB — HEMOGLOBIN A1C: Hgb A1c MFr Bld: 6.4 % (ref 4.6–6.5)

## 2023-01-16 LAB — LIPID PANEL
Cholesterol: 140 mg/dL (ref 0–200)
HDL: 32.7 mg/dL — ABNORMAL LOW (ref >39.00)
LDL Cholesterol: 55 mg/dL (ref 0–99)
NonHDL: 107
Total CHOL/HDL Ratio: 4
Triglycerides: 260 mg/dL — ABNORMAL HIGH (ref 0.0–149.0)
VLDL: 52 mg/dL — ABNORMAL HIGH (ref 0.0–40.0)

## 2023-01-16 MED ORDER — ALBUTEROL SULFATE HFA 108 (90 BASE) MCG/ACT IN AERS: 1.0000 | INHALATION_SPRAY | Freq: Four times a day (QID) | RESPIRATORY_TRACT | 1 refills | Status: DC | PRN

## 2023-01-16 MED ORDER — PRAVASTATIN SODIUM 80 MG PO TABS: 80.0000 mg | ORAL_TABLET | Freq: Every day | ORAL | 1 refills | Status: DC

## 2023-01-16 MED ORDER — LEVOFLOXACIN 500 MG PO TABS: 500.0000 mg | ORAL_TABLET | Freq: Every day | ORAL | 0 refills | Status: DC

## 2023-01-16 MED ORDER — LISINOPRIL-HYDROCHLOROTHIAZIDE 20-25 MG PO TABS: 1.0000 | ORAL_TABLET | Freq: Every day | ORAL | 1 refills | Status: DC

## 2023-01-16 NOTE — Progress Notes (Signed)
Subjective:  Patient ID: Joshua Levine, male    DOB: 03/20/1950  Age: 72 y.o. MRN: [id number]  CC:  Chief Complaint  Patient presents with   Medical Management of Chronic Issues    Pt is doing okay, thinks he would like to see an ENT about recurrent sinus infection     HPI Rosio Levine presents for   Recurrent sinusitis Treated in June for subacute sinusitis, allergy treatment previously with azelastine, fluticasone, Claritin.  Progressed to suspected bacterial sinusitis.  Treated with Augmentin. Video visit August 2 with sinus congestion, again thought to have component of allergies.  Thought to be allergic versus viral at that visit.  Held on antibiotics. November 15 visit for suspected sinusitis with symptoms for 3-1/2 weeks, initial chest and sinus congestion.  Some improvement with secondary sickening.  Treated with Augmentin again - additional 1 week added for persistent symptoms Finished abx few weeks ago - improving, then some increased drainage at night when lying down. Discolored nasal d/c with blowing nose in am, sometimes during day. Has not become clear.  Feels like worsening congestion and discoloration with bad taste of d/c. Remains on left side.  No fever, dyspnea,chest pain.  No tooth/face pain - noted earlier in the summer Some diarrhea with prior augmentin, better now and tolerated otherwise.  Hyperlipidemia: Discussed in June, July.  Temporary statin holiday due to arthralgias/myalgias with some improvement.  He was able to return to every other day dosing that was tolerated better than daily dosing.  Currently taking pravastatin 80 mg daily for past few months. Tylenol for arthritis as managed prior cramps - not feeing those now.  Lab Results  Component Value Date   CHOL 159 07/08/2022   HDL 32.60 (L) 07/08/2022   LDLCALC 75 11/06/2019   LDLDIRECT 100.0 07/08/2022   TRIG 328.0 (H) 07/08/2022   CHOLHDL 5 07/08/2022   Lab Results  Component Value Date   ALT  57 (H) 07/08/2022   AST 36 07/08/2022   ALKPHOS 68 07/08/2022   BILITOT 0.5 07/08/2022   Prediabetes: Diet/exercise approach.  A1c had improved from 6.4-6.1 when last checked in June.no new diet or exercise.  Lab Results  Component Value Date   HGBA1C 6.1 07/08/2022   Wt Readings from Last 3 Encounters:  01/16/23 245 lb 6.4 oz (111.3 kg)  07/25/22 245 lb (111.1 kg)  07/08/22 245 lb (111.1 kg)   Mild persistent asthma Advair HFA 115/21 1 puff twice daily. Has albuterol as needed - rare need - once per week or less.   Hypertension: Lisinopril HCTZ 20/25 mg daily. No new side effects.  Home readings:none.  BP Readings from Last 3 Encounters:  01/16/23 132/78  07/25/22 130/74  07/08/22 122/78   Lab Results  Component Value Date   CREATININE 1.10 07/08/2022     History Patient Active Problem List   Diagnosis Date Noted   Family history of factor V Leiden mutation 11/06/2019   Essential hypertension 12/10/2011   Hyperlipidemia 12/10/2011   Erectile dysfunction 12/10/2011   Past Medical History:  Diagnosis Date   Allergy    Arthritis    Basal cell carcinoma    Erectile dysfunction    Hyperlipidemia    Hypertension    Past Surgical History:  Procedure Laterality Date   cataract Bilateral    KNEE SURGERY     LUMBAR DISC SURGERY     SPINE SURGERY     No Known Allergies Prior to Admission medications   Medication Sig Start  Date End Date Taking? Authorizing Provider  albuterol (VENTOLIN HFA) 108 (90 Base) MCG/ACT inhaler INHALE 1-2 PUFFS INTO THE LUNGS EVERY 4 HOURS AS NEEDED FOR WHEEZING OR SHORTNESS OF BREATH. 01/15/21  Yes Janeece Agee, NP  amoxicillin-clavulanate (AUGMENTIN) 875-125 MG tablet Take 1 tablet by mouth 2 (two) times daily. 12/27/22  Yes Shade Flood, MD  Ascorbic Acid (VITAMIN C) 100 MG tablet Take 100 mg by mouth daily.    Yes [provider]  Azelastine-Fluticasone 137-50 MCG/ACT SUSP PLACE 1 SPRAY INTO THE NOSE EVERY 12 (TWELVE)  HOURS. 10/21/22  Yes Shade Flood, MD  celecoxib (CELEBREX) 50 MG capsule Take 50 mg by mouth 2 (two) times daily. Pt does not know strength   Yes [provider]  fluticasone-salmeterol (ADVAIR HFA) 115-21 MCG/ACT inhaler Inhale 2 puffs into the lungs 2 (two) times daily. 12/31/21  Yes Shade Flood, MD  gatifloxacin (ZYMAXID) 0.5 % SOLN Place 1 drop into the left eye 4 (four) times daily. 04/26/22  Yes [provider]  glucosamine-chondroitin 500-400 MG tablet Take 1 tablet by mouth 3 (three) times daily.   Yes [provider]  ipratropium (ATROVENT) 0.06 % nasal spray Place 1-2 sprays into both nostrils 4 (four) times daily. As needed for nasal congestion 12/31/21  Yes Shade Flood, MD  lisinopril-hydrochlorothiazide (ZESTORETIC) 20-25 MG tablet TAKE 1 TABLET BY MOUTH EVERY DAY 12/20/22  Yes Shade Flood, MD  loratadine (CLARITIN) 10 MG tablet TAKE 1 TABLET BY MOUTH EVERYDAY AT BEDTIME 06/24/22  Yes Shade Flood, MD  pravastatin (PRAVACHOL) 80 MG tablet TAKE 1 TABLET BY MOUTH EVERY DAY 09/05/22  Yes Shade Flood, MD   Social History   Socioeconomic History   Marital status: Married    Spouse name: Not on file   Number of children: Not on file   Years of education: Not on file   Highest education level: Associate degree: occupational, Scientist, product/process development, or vocational program  Occupational History   Occupation: Radiation protection practitioner: [employer]  Tobacco Use   Smoking status: Former   Smokeless tobacco: Never  Substance and Sexual Activity   Alcohol use: No   Drug use: No   Sexual activity: Yes  Other Topics Concern   Not on file  Social History Narrative   Married. Education: [employer]. Exercise: Walk 2 times a week for 45 minutes.   Social Drivers of Corporate investment banker Strain: Low Risk  (01/15/2023)   Overall Financial Resource Strain (CARDIA)    Difficulty of Paying Living Expenses: Not hard at all  Food Insecurity: No Food  Insecurity (01/15/2023)   Hunger Vital Sign    Worried About Running Out of Food in the Last Year: Never true    Ran Out of Food in the Last Year: Never true  Transportation Needs: No Transportation Needs (01/15/2023)   PRAPARE - Administrator, Civil Service (Medical): No    Lack of Transportation (Non-Medical): No  Physical Activity: Insufficiently Active (01/15/2023)   Exercise Vital Sign    Days of Exercise per Week: 1 day    Minutes of Exercise per Session: 10 min  Stress: No Stress Concern Present (01/15/2023)   [employer] of Occupational Health - Occupational Stress Questionnaire    Feeling of Stress : Only a little  Social Connections: Moderately Isolated (01/15/2023)   Social Connection and Isolation Panel [NHANES]    Frequency of Communication with Friends and Family: More than three times a  week    Frequency of Social Gatherings with Friends and Family: Once a week    Attends Religious Services: Never    Database administrator or Organizations: No    Attends Engineer, structural: Not on file    Marital Status: Married  Intimate Partner Violence: Unknown (08/31/2021)   Received from [employer], [facility]   HITS    Physically Hurt: Not on file    Insult or Talk Down To: Not on file    Threaten Physical Harm: Not on file    Scream or Curse: Not on file    Review of Systems  Constitutional:  Negative for fatigue and unexpected weight change.  Eyes:  Negative for visual disturbance.  Respiratory:  Negative for cough, chest tightness and shortness of breath.   Cardiovascular:  Negative for chest pain, palpitations and leg swelling.  Gastrointestinal:  Negative for abdominal pain and blood in stool.  Neurological:  Negative for dizziness, light-headedness and headaches.     Objective:   Vitals:   01/16/23 0818  BP: 132/78  Pulse: 70  Temp: 98.2 F (36.8 C)  TempSrc: Temporal  SpO2: 98%  Weight: 245 lb 6.4 oz (111.3 kg)  Height:  6\' 3"  (1.905 m)     Physical Exam Vitals reviewed.  Constitutional:      Appearance: He is well-developed.  HENT:     Head: Normocephalic and atraumatic.     Right Ear: Tympanic membrane, ear canal and external ear normal.     Left Ear: Tympanic membrane, ear canal and external ear normal.     Nose: No rhinorrhea.     Comments: No Sinus ttp    Mouth/Throat:     Pharynx: No oropharyngeal exudate or posterior oropharyngeal erythema.  Eyes:     Conjunctiva/sclera: Conjunctivae normal.     Pupils: Pupils are equal, round, and reactive to light.  Neck:     Vascular: No carotid bruit or JVD.  Cardiovascular:     Rate and Rhythm: Normal rate and regular rhythm.     Heart sounds: Normal heart sounds. No murmur heard. Pulmonary:     Effort: Pulmonary effort is normal.     Breath sounds: Normal breath sounds. No wheezing, rhonchi or rales.  Abdominal:     Palpations: Abdomen is soft.     Tenderness: There is no abdominal tenderness.  Musculoskeletal:     Cervical back: Neck supple.     Right lower leg: No edema.     Left lower leg: No edema.  Lymphadenopathy:     Cervical: No cervical adenopathy.  Skin:    General: Skin is warm and dry.     Findings: No rash.  Neurological:     Mental Status: He is alert and oriented to person, place, and time.  Psychiatric:        Mood and Affect: Mood normal.        Behavior: Behavior normal.        Assessment & Plan:  Stacey Matejka is a 72 y.o. male . Recurrent sinusitis - Plan: Ambulatory referral to ENT, CT MAXILLOFACIAL WO CONTRAST, levofloxacin (LEVAQUIN) 500 MG tablet  -Recurrent with multiple antibiotic treatment courses as above, most recently Augmentin with extension of course, recurrence of symptoms off antibiotic.  Early/mild symptoms, but given recent Augmentin will start Levaquin.  5-day course given mild symptoms.  Potential side effects and risks including but not limited to tendinopathy/tendon rupture, neuropathy.   Understanding expressed.  Referred to ENT  for further evaluation, and check maxillofacial CT.  Continue allergy treatments, symptomatic care with RTC precautions.  Asthma, unspecified asthma severity, unspecified whether persistent - Plan: albuterol (VENTOLIN HFA) 108 (90 Base) MCG/ACT inhaler  -Stable, continue Advair with albuterol as needed for breakthrough symptoms with RTC precautions if increased frequency of need.  Essential hypertension - Plan: Comprehensive metabolic panel, lisinopril-hydrochlorothiazide (ZESTORETIC) 20-25 MG tablet  -Borderline but overall stable and tolerating current regimen, continue same, check labs.  Hyperlipidemia, unspecified hyperlipidemia type - Plan: Comprehensive metabolic panel, Lipid panel, pravastatin (PRAVACHOL) 80 MG tablet  -Now tolerating daily dosing of pravastatin, check labs and adjust plan accordingly, RTC precautions if new myalgias.  Prediabetes - Plan: Hemoglobin A1c  -Watch diet/exercise, check A1c and adjust plan accordingly.  Meds ordered this encounter  Medications   levofloxacin (LEVAQUIN) 500 MG tablet    Sig: Take 1 tablet (500 mg total) by mouth daily.    Dispense:  5 tablet    Refill:  0   albuterol (VENTOLIN HFA) 108 (90 Base) MCG/ACT inhaler    Sig: Inhale 1-2 puffs into the lungs every 6 (six) hours as needed for wheezing or shortness of breath.    Dispense:  18 each    Refill:  1   lisinopril-hydrochlorothiazide (ZESTORETIC) 20-25 MG tablet    Sig: Take 1 tablet by mouth daily.    Dispense:  90 tablet    Refill:  1   pravastatin (PRAVACHOL) 80 MG tablet    Sig: Take 1 tablet (80 mg total) by mouth daily.    Dispense:  90 tablet    Refill:  1   Patient Instructions  I will refer you to ENT to discuss the recurrent sinus infections but meantime I would like you to have a maxillofacial CT scan.  That was ordered and they should be calling you to schedule that appointment soon.  I also prescribed a different antibiotic  called levofloxacin is taken once per day for 5 days.  Continue saline nasal spray and other allergy medications.  Return to the clinic or go to the nearest emergency room if any of your symptoms worsen or new symptoms occur.  No other med changes at this time. Take care.   Sinus Infection, Adult A sinus infection is soreness and swelling (inflammation) of your sinuses. Sinuses are hollow spaces in the bones around your face. They are located: Around your eyes. In the middle of your forehead. Behind your nose. In your cheekbones. Your sinuses and nasal passages are lined with a fluid called mucus. Mucus drains out of your sinuses. Swelling can trap mucus in your sinuses. This lets germs (bacteria, virus, or fungus) grow, which leads to infection. Most of the time, this condition is caused by a virus. What are the causes? Allergies. Asthma. Germs. Things that block your nose or sinuses. Growths in the nose (nasal polyps). Chemicals or irritants in the air. A fungus. This is rare. What increases the risk? Having a weak body defense system (immune system). Doing a lot of swimming or diving. Using nasal sprays too much. Smoking. What are the signs or symptoms? The main symptoms of this condition are pain and a feeling of pressure around the sinuses. Other symptoms include: Stuffy nose (congestion). This may make it hard to breathe through your nose. Runny nose (drainage). Soreness, swelling, and warmth in the sinuses. A cough that may get worse at night. Being unable to smell and taste. Mucus that collects in the throat or the back of  the nose (postnasal drip). This may cause a sore throat or bad breath. Being very tired (fatigued). A fever. How is this diagnosed? Your symptoms. Your medical history. A physical exam. Tests to find out if your condition is short-term (acute) or long-term (chronic). Your doctor may: Check your nose for growths (polyps). Check your sinuses using a  tool that has a light on one end (endoscope). Check for allergies or germs. Do imaging tests, such as an MRI or CT scan. How is this treated? Treatment for this condition depends on the cause and whether it is short-term or long-term. If caused by a virus, your symptoms should go away on their own within 10 days. You may be given medicines to relieve symptoms. They include: Medicines that shrink swollen tissue in the nose. A spray that treats swelling of the nostrils. Rinses that help get rid of thick mucus in your nose (nasal saline washes). Medicines that treat allergies (antihistamines). Over-the-counter pain relievers. If caused by bacteria, your doctor may wait to see if you will get better without treatment. You may be given antibiotic medicine if you have: A very bad infection. A weak body defense system. If caused by growths in the nose, surgery may be needed. Follow these instructions at home: Medicines Take, use, or apply over-the-counter and prescription medicines only as told by your doctor. These may include nasal sprays. If you were prescribed an antibiotic medicine, take it as told by your doctor. Do not stop taking it even if you start to feel better. Hydrate and humidify  Drink enough water to keep your pee (urine) pale yellow. Use a cool mist humidifier to keep the humidity level in your home above 50%. Breathe in steam for 10-15 minutes, 3-4 times a day, or as told by your doctor. You can do this in the bathroom while a hot shower is running. Try not to spend time in cool or dry air. Rest Rest as much as you can. Sleep with your head raised (elevated). Make sure you get enough sleep each night. General instructions  Put a warm, moist washcloth on your face 3-4 times a day, or as often as told by your doctor. Use nasal saline washes as often as told by your doctor. Wash your hands often with soap and water. If you cannot use soap and water, use hand sanitizer. Do  not smoke. Avoid being around people who are smoking (secondhand smoke). Keep all follow-up visits. Contact a doctor if: You have a fever. Your symptoms get worse. Your symptoms do not get better within 10 days. Get help right away if: You have a very bad headache. You cannot stop vomiting. You have very bad pain or swelling around your face or eyes. You have trouble seeing. You feel confused. Your neck is stiff. You have trouble breathing. These symptoms may be an emergency. Get help right away. Call 911. Do not wait to see if the symptoms will go away. Do not drive yourself to the hospital. Summary A sinus infection is swelling of your sinuses. Sinuses are hollow spaces in the bones around your face. This condition is caused by tissues in your nose that become inflamed or swollen. This traps germs. These can lead to infection. If you were prescribed an antibiotic medicine, take it as told by your doctor. Do not stop taking it even if you start to feel better. Keep all follow-up visits. This information is not intended to replace advice given to you by your health care  provider. Make sure you discuss any questions you have with your health care provider. Document Revised: 12/22/2020 Document Reviewed: 12/22/2020 Elsevier Patient Education  2024 Elsevier Inc.     Signed,   Meredith Staggers, MD [facility], [facility] 01/16/23 9:00 AM

## 2023-01-16 NOTE — Patient Instructions (Addendum)
I will refer you to ENT to discuss the recurrent sinus infections but meantime I would like you to have a maxillofacial CT scan.  That was ordered and they should be calling you to schedule that appointment soon.  I also prescribed a different antibiotic called levofloxacin is taken once per day for 5 days.  Continue saline nasal spray and other allergy medications.  Return to the clinic or go to the nearest emergency room if any of your symptoms worsen or new symptoms occur.  No other med changes at this time. Take care.   Sinus Infection, Adult A sinus infection is soreness and swelling (inflammation) of your sinuses. Sinuses are hollow spaces in the bones around your face. They are located: Around your eyes. In the middle of your forehead. Behind your nose. In your cheekbones. Your sinuses and nasal passages are lined with a fluid called mucus. Mucus drains out of your sinuses. Swelling can trap mucus in your sinuses. This lets germs (bacteria, virus, or fungus) grow, which leads to infection. Most of the time, this condition is caused by a virus. What are the causes? Allergies. Asthma. Germs. Things that block your nose or sinuses. Growths in the nose (nasal polyps). Chemicals or irritants in the air. A fungus. This is rare. What increases the risk? Having a weak body defense system (immune system). Doing a lot of swimming or diving. Using nasal sprays too much. Smoking. What are the signs or symptoms? The main symptoms of this condition are pain and a feeling of pressure around the sinuses. Other symptoms include: Stuffy nose (congestion). This may make it hard to breathe through your nose. Runny nose (drainage). Soreness, swelling, and warmth in the sinuses. A cough that may get worse at night. Being unable to smell and taste. Mucus that collects in the throat or the back of the nose (postnasal drip). This may cause a sore throat or bad breath. Being very tired (fatigued). A  fever. How is this diagnosed? Your symptoms. Your medical history. A physical exam. Tests to find out if your condition is short-term (acute) or long-term (chronic). Your doctor may: Check your nose for growths (polyps). Check your sinuses using a tool that has a light on one end (endoscope). Check for allergies or germs. Do imaging tests, such as an MRI or CT scan. How is this treated? Treatment for this condition depends on the cause and whether it is short-term or long-term. If caused by a virus, your symptoms should go away on their own within 10 days. You may be given medicines to relieve symptoms. They include: Medicines that shrink swollen tissue in the nose. A spray that treats swelling of the nostrils. Rinses that help get rid of thick mucus in your nose (nasal saline washes). Medicines that treat allergies (antihistamines). Over-the-counter pain relievers. If caused by bacteria, your doctor may wait to see if you will get better without treatment. You may be given antibiotic medicine if you have: A very bad infection. A weak body defense system. If caused by growths in the nose, surgery may be needed. Follow these instructions at home: Medicines Take, use, or apply over-the-counter and prescription medicines only as told by your doctor. These may include nasal sprays. If you were prescribed an antibiotic medicine, take it as told by your doctor. Do not stop taking it even if you start to feel better. Hydrate and humidify  Drink enough water to keep your pee (urine) pale yellow. Use a cool mist humidifier to  keep the humidity level in your home above 50%. Breathe in steam for 10-15 minutes, 3-4 times a day, or as told by your doctor. You can do this in the bathroom while a hot shower is running. Try not to spend time in cool or dry air. Rest Rest as much as you can. Sleep with your head raised (elevated). Make sure you get enough sleep each night. General  instructions  Put a warm, moist washcloth on your face 3-4 times a day, or as often as told by your doctor. Use nasal saline washes as often as told by your doctor. Wash your hands often with soap and water. If you cannot use soap and water, use hand sanitizer. Do not smoke. Avoid being around people who are smoking (secondhand smoke). Keep all follow-up visits. Contact a doctor if: You have a fever. Your symptoms get worse. Your symptoms do not get better within 10 days. Get help right away if: You have a very bad headache. You cannot stop vomiting. You have very bad pain or swelling around your face or eyes. You have trouble seeing. You feel confused. Your neck is stiff. You have trouble breathing. These symptoms may be an emergency. Get help right away. Call 911. Do not wait to see if the symptoms will go away. Do not drive yourself to the hospital. Summary A sinus infection is swelling of your sinuses. Sinuses are hollow spaces in the bones around your face. This condition is caused by tissues in your nose that become inflamed or swollen. This traps germs. These can lead to infection. If you were prescribed an antibiotic medicine, take it as told by your doctor. Do not stop taking it even if you start to feel better. Keep all follow-up visits. This information is not intended to replace advice given to you by your health care provider. Make sure you discuss any questions you have with your health care provider. Document Revised: 12/22/2020 Document Reviewed: 12/22/2020 Elsevier Patient Education  2024 [employer].

## 2023-01-18 ENCOUNTER — Other Ambulatory Visit: Payer: Self-pay | Admitting: Family Medicine

## 2023-01-18 DIAGNOSIS — R0609 Other forms of dyspnea: Secondary | ICD-10-CM

## 2023-01-18 DIAGNOSIS — R059 Cough, unspecified: Secondary | ICD-10-CM

## 2023-01-18 DIAGNOSIS — J4530 Mild persistent asthma, uncomplicated: Secondary | ICD-10-CM

## 2023-01-24 ENCOUNTER — Telehealth: Payer: Self-pay | Admitting: Family Medicine

## 2023-01-26 DIAGNOSIS — M25561 Pain in right knee: Secondary | ICD-10-CM | POA: Diagnosis not present

## 2023-01-26 DIAGNOSIS — M25562 Pain in left knee: Secondary | ICD-10-CM | POA: Diagnosis not present

## 2023-01-27 ENCOUNTER — Encounter (INDEPENDENT_AMBULATORY_CARE_PROVIDER_SITE_OTHER): Payer: Self-pay | Admitting: Otolaryngology

## 2023-01-27 NOTE — Telephone Encounter (Signed)
error 

## 2023-01-30 ENCOUNTER — Other Ambulatory Visit: Payer: Self-pay | Admitting: Family Medicine

## 2023-01-30 DIAGNOSIS — J309 Allergic rhinitis, unspecified: Secondary | ICD-10-CM

## 2023-01-31 ENCOUNTER — Ambulatory Visit
Admission: RE | Admit: 2023-01-31 | Discharge: 2023-01-31 | Disposition: A | Discharge status: Home / Self Care | Payer: BC Managed Care – PPO | Source: Ambulatory Visit | Attending: Family Medicine | Admitting: Family Medicine

## 2023-01-31 DIAGNOSIS — J329 Chronic sinusitis, unspecified: Secondary | ICD-10-CM

## 2023-01-31 DIAGNOSIS — J3489 Other specified disorders of nose and nasal sinuses: Secondary | ICD-10-CM | POA: Diagnosis not present

## 2023-02-02 ENCOUNTER — Telehealth (INDEPENDENT_AMBULATORY_CARE_PROVIDER_SITE_OTHER): Payer: Self-pay | Admitting: Otolaryngology

## 2023-02-02 ENCOUNTER — Encounter: Payer: Self-pay | Admitting: Family Medicine

## 2023-02-02 NOTE — Telephone Encounter (Signed)
 Scheduled referral, gave correct location of new patient appt

## 2023-02-03 NOTE — Telephone Encounter (Signed)
 Called radiology room to get read on the CT should patient return since appt was 01/16/23?

## 2023-02-06 ENCOUNTER — Other Ambulatory Visit: Payer: Self-pay | Admitting: Family Medicine

## 2023-02-06 DIAGNOSIS — J329 Chronic sinusitis, unspecified: Secondary | ICD-10-CM

## 2023-02-06 MED ORDER — LEVOFLOXACIN 500 MG PO TABS: 500.0000 mg | ORAL_TABLET | Freq: Every day | ORAL | 0 refills | Status: DC

## 2023-02-06 NOTE — Telephone Encounter (Signed)
 MyChart message reviewed.  See CT scan results for plan.

## 2023-02-06 NOTE — Progress Notes (Signed)
 Levaquin 10-day course prescribed.  See CT scan results regarding persistent, recurrent sinusitis.

## 2023-02-11 IMAGING — DX DG CHEST 2V
2 series · 3 of 3 positions shown · non-contrast
Comparison: 10/17/2018

CLINICAL DATA: Dyspnea on exertion, asthma, recent NYIGY-8M
infection, post COVID cough, congestion, shortness of breath,
history hypertension

EXAM:
CHEST - 2 VIEW

[Series 1: chest pa · 0.14mm/px · 2 of 2 slices shown]
[im 1/2]
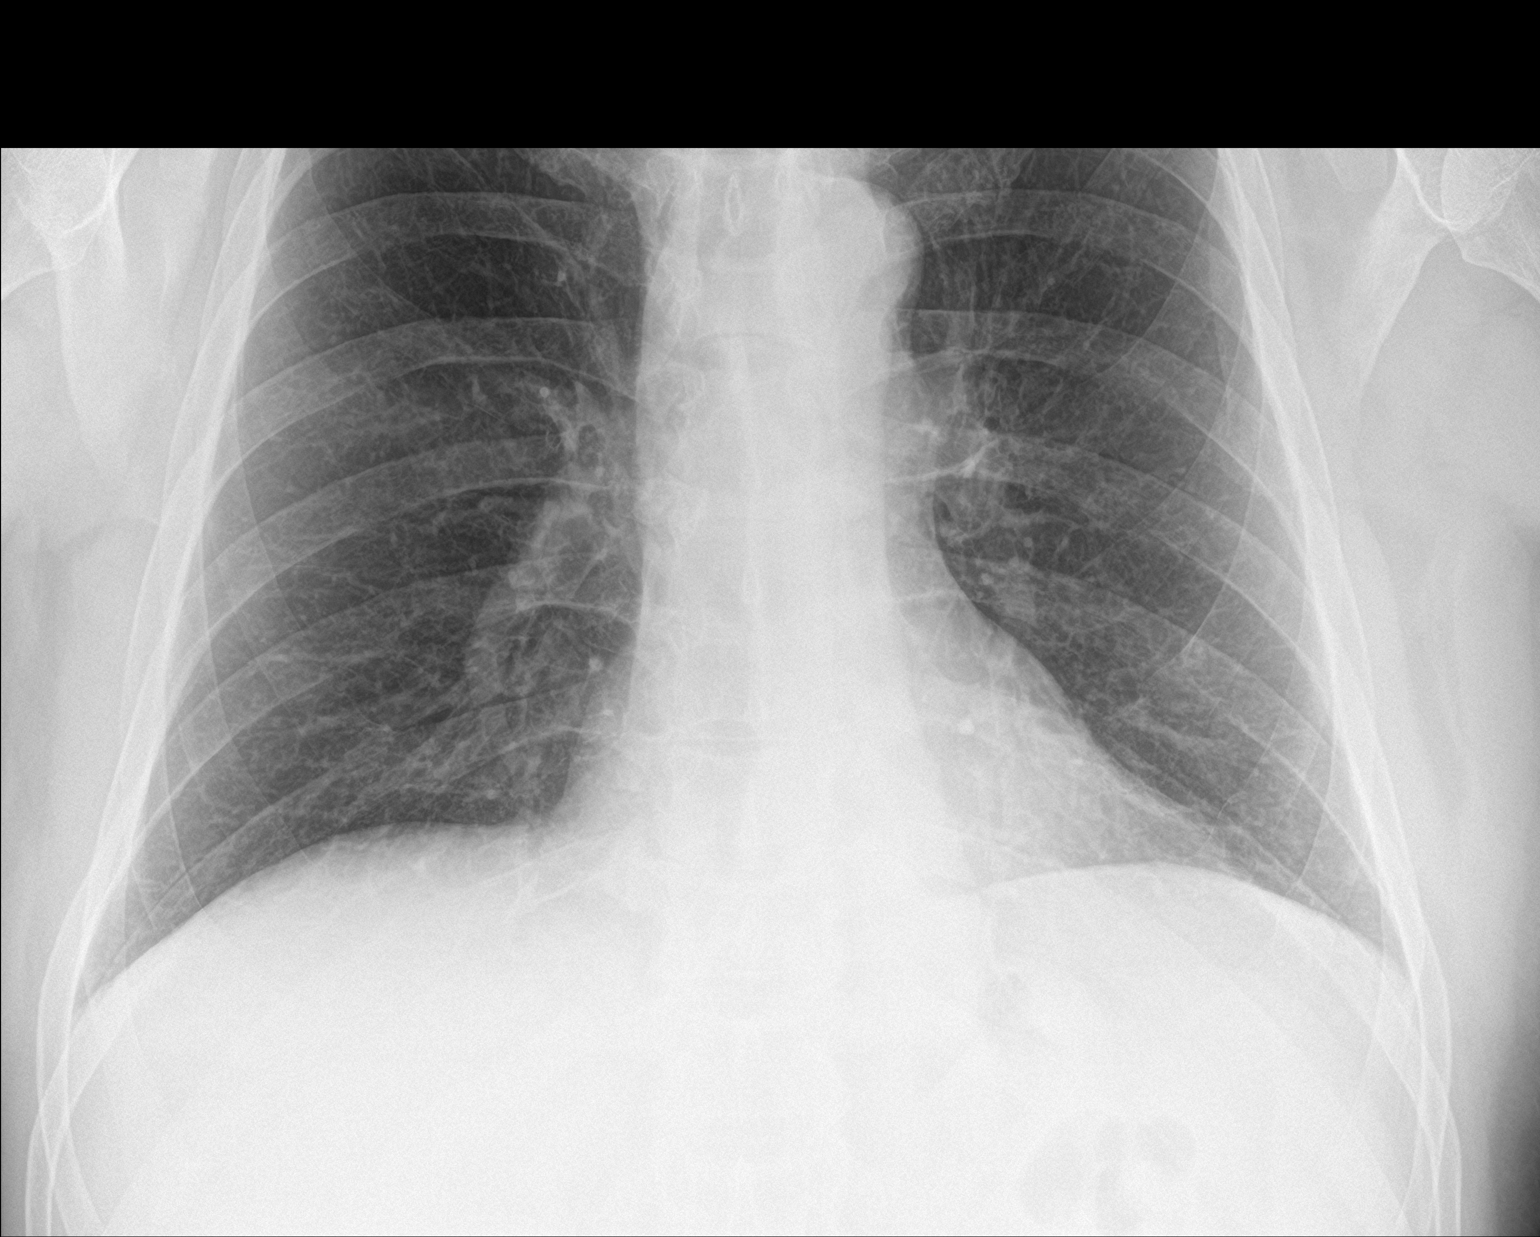
[im 2/2]
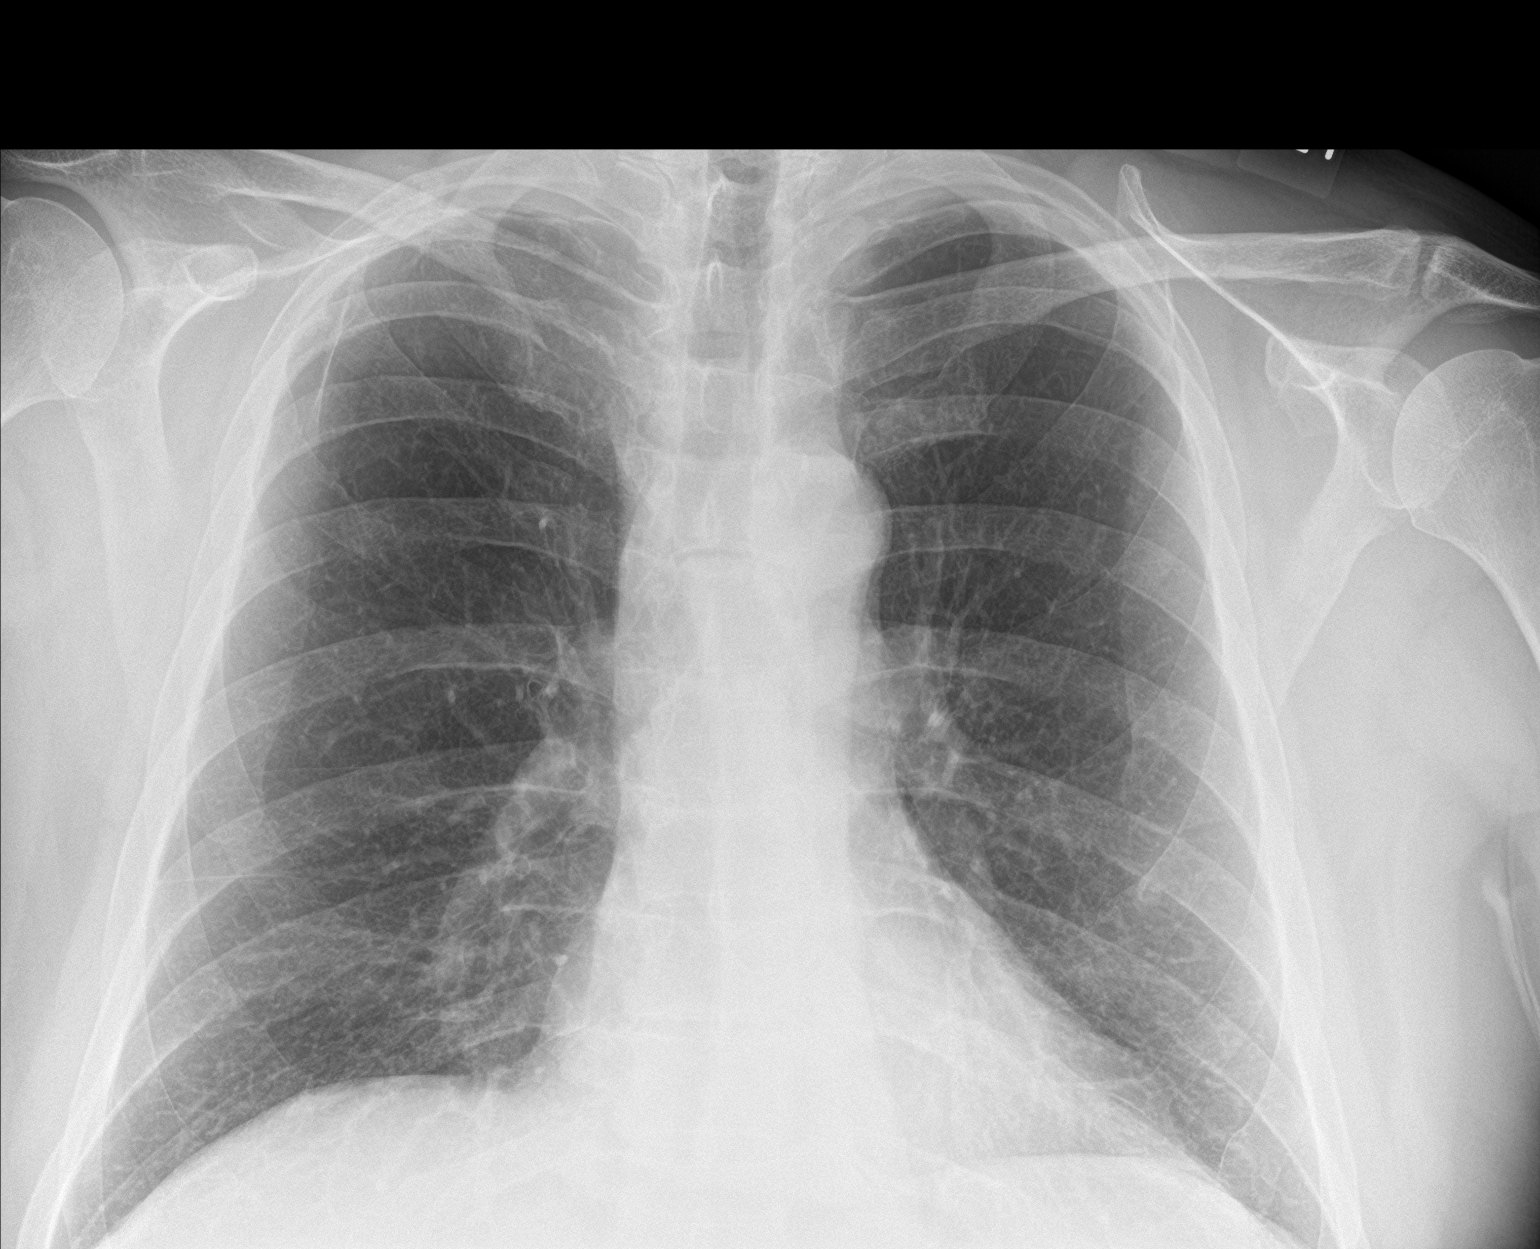

[chest lat]
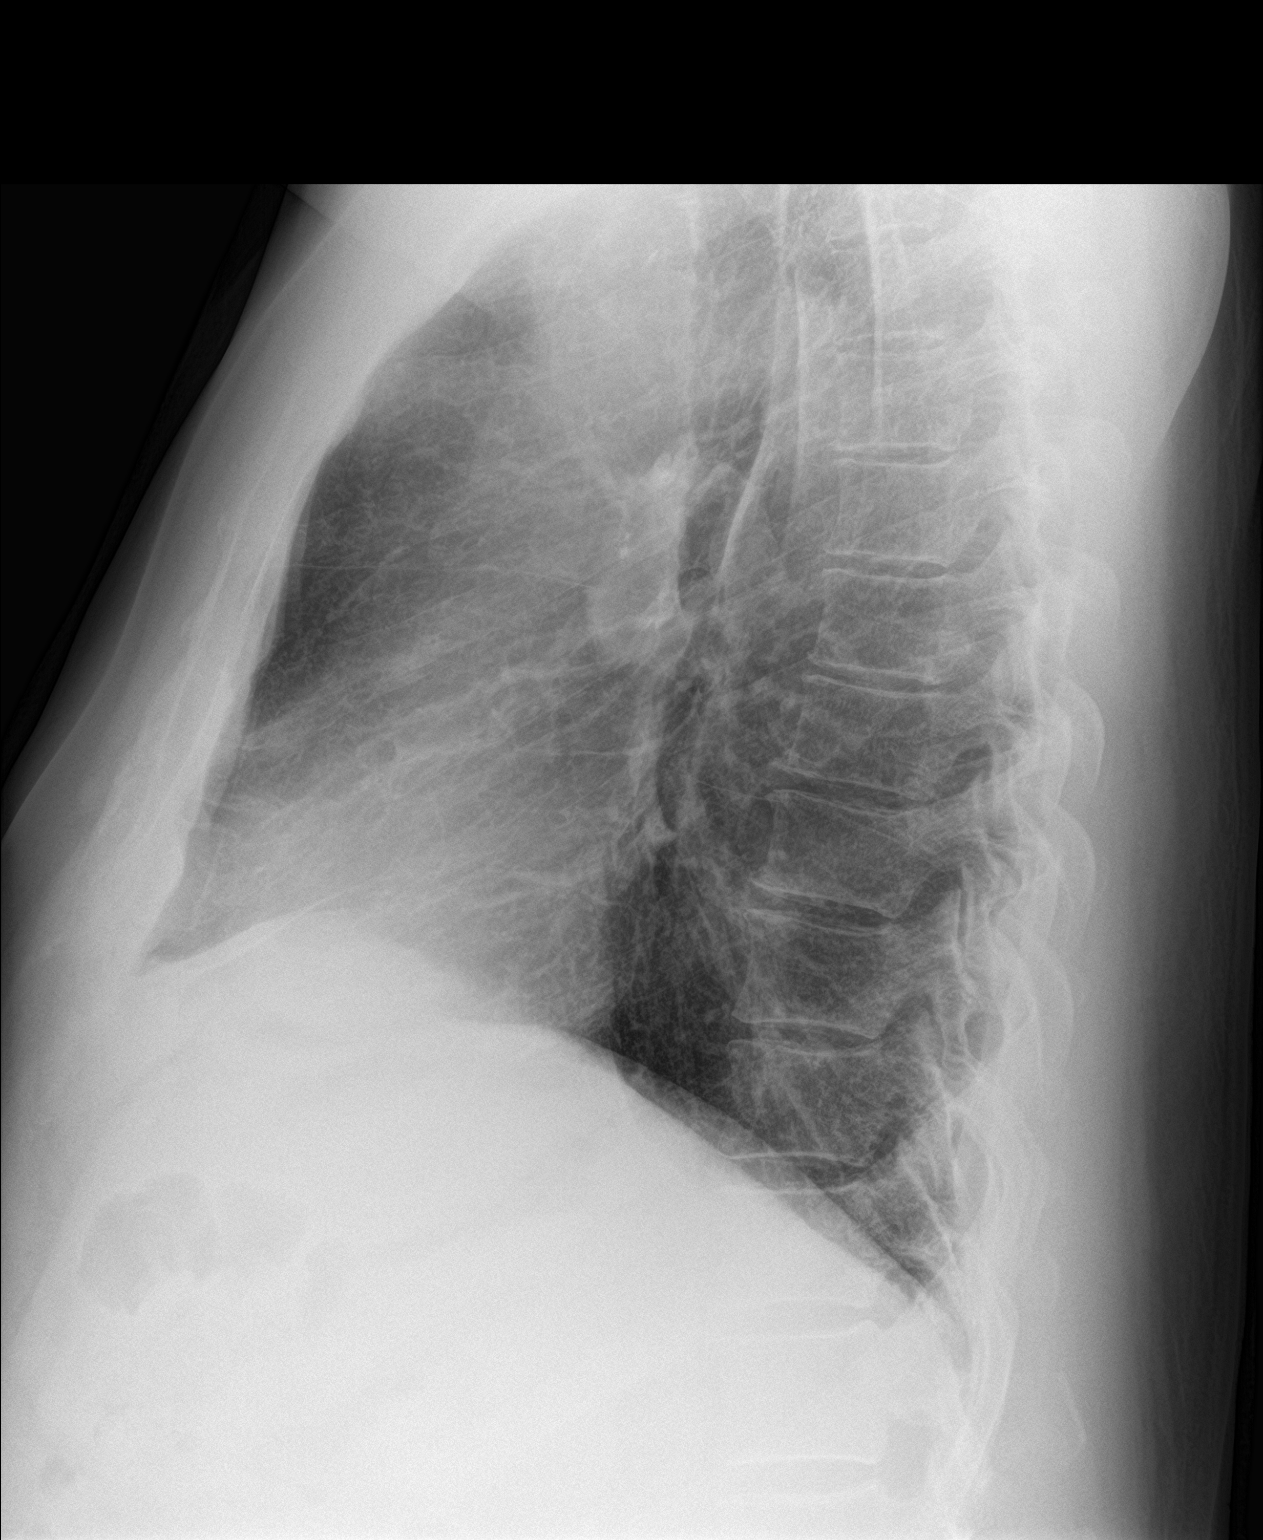

[3 of 3 positions shown; findings below may reference images not displayed]

FINDINGS: Normal heart size, mediastinal contours, and pulmonary vascularity.

Lungs clear.

No pleural effusion or pneumothorax.

Bones unremarkable.
IMPRESSION: No acute abnormalities.

## 2023-02-16 DIAGNOSIS — M170 Bilateral primary osteoarthritis of knee: Secondary | ICD-10-CM | POA: Diagnosis not present

## 2023-02-23 DIAGNOSIS — M170 Bilateral primary osteoarthritis of knee: Secondary | ICD-10-CM | POA: Diagnosis not present

## 2023-03-01 ENCOUNTER — Telehealth (INDEPENDENT_AMBULATORY_CARE_PROVIDER_SITE_OTHER): Payer: Self-pay | Admitting: Otolaryngology

## 2023-03-01 NOTE — Telephone Encounter (Signed)
Reminder Call: Date: 03/02/2023 Status: Sch  Time: 8:30 AM [address] [place], [place] [address]  Confirmed time and location w/patient.

## 2023-03-02 ENCOUNTER — Ambulatory Visit (INDEPENDENT_AMBULATORY_CARE_PROVIDER_SITE_OTHER): Payer: BC Managed Care – PPO | Admitting: Otolaryngology

## 2023-03-02 ENCOUNTER — Encounter (INDEPENDENT_AMBULATORY_CARE_PROVIDER_SITE_OTHER): Payer: Self-pay

## 2023-03-02 VITALS — BP 137/83 | HR 87 | Resp 19 | Wt 245.0 lb

## 2023-03-02 DIAGNOSIS — J3489 Other specified disorders of nose and nasal sinuses: Secondary | ICD-10-CM

## 2023-03-02 DIAGNOSIS — J342 Deviated nasal septum: Secondary | ICD-10-CM

## 2023-03-02 DIAGNOSIS — R0982 Postnasal drip: Secondary | ICD-10-CM | POA: Diagnosis not present

## 2023-03-02 DIAGNOSIS — J343 Hypertrophy of nasal turbinates: Secondary | ICD-10-CM

## 2023-03-02 DIAGNOSIS — J329 Chronic sinusitis, unspecified: Secondary | ICD-10-CM

## 2023-03-02 DIAGNOSIS — J328 Other chronic sinusitis: Secondary | ICD-10-CM | POA: Diagnosis not present

## 2023-03-02 DIAGNOSIS — R0981 Nasal congestion: Secondary | ICD-10-CM | POA: Diagnosis not present

## 2023-03-02 DIAGNOSIS — M170 Bilateral primary osteoarthritis of knee: Secondary | ICD-10-CM | POA: Diagnosis not present

## 2023-03-02 MED ORDER — PREDNISONE 10 MG PO TABS: 10.0000 mg | ORAL_TABLET | Freq: Every day | ORAL | 0 refills | Status: AC

## 2023-03-02 MED ORDER — LEVOFLOXACIN 500 MG PO TABS: 500.0000 mg | ORAL_TABLET | Freq: Every day | ORAL | 0 refills | Status: DC

## 2023-03-02 NOTE — Progress Notes (Signed)
Dear Dr. Neva Seat, Here is my assessment for our mutual patient, Joshua Levine. Thank you for allowing me the opportunity to care for your patient. Please do not hesitate to contact me should you have any other questions. Sincerely, Dr. Jovita Kussmaul  Otolaryngology Clinic Note Referring provider: Dr. Neva Seat HPI:  Joshua Levine is a 73 y.o. male kindly referred by Dr. Neva Seat for evaluation of chronic sinusitis.  Initial visit (02/2023): He reports chronic sinus trouble "all my life." Patient reports that he started to have particularly bad issues with his sinuses this summer with left > right sided maxillary sinus tenderness, and some ethmoid pressure. He reports he also then had discolored drainage, and with bad taste and odor. Lasted for 2 months. He had a dental eval during this time, and reported that his teeth were doing well. He reports that he does not have pain currently and not tasting drainage. He has been prescribed multiple courses of antibiotics - levaquin, augmentin, and 3 more courses (mostly augmentin). The antibiotics seemed to help, but then came back. He reports that he is doing better now, but back at his baseline (which is as noted below)  He reports that before this, he has chronic issues with his sinuses. Reports bilateral post nasal drip, some anterior rhinorrhea (mucus), and no facial pressure/pain. Breathes fairly well through the nose, occassional congestion. Some left obstruction. Sense of smell is decent.  He gets about 2-3 infections/year for "decades" - discolored mucus, facial pain -- mostly on left but also intermittent on right.  He currently saline spray, has used astelin, flonase, and atrovent in the past, currently using. Using dymista currently and claritin. Has used sinus rinses intermittently  - did not help much.  Allergy testing - last time 5 years ago -- allergic to dust, molds, dogs, grasses,  trees. -------------------------------------------------------------  H&N Surgery: no Personal or FHx of bleeding dz or anesthesia difficulty: no - but family history of Factor V Leiden Mutation  GLP-1: no AP/AC: no  PMHx: HTN, HLD, Arthritis on Celebrex  Tobacco: former. 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 Lives in [place], [place]  Independent Review of Additional Tests or Records:  Dr. Neva Seat (01/16/2023 and 12/16/2022): Dx: Sinusitis in 12/2022; Rx: Augmentin; Dec 2024: subacute sinusitis, improved after with abx but worsening drainage, discolored drainage, not clear; Rx: levaquin, Ref ENT, CT Sinus Dr. Neva Seat (FM) (07/25/2022 and 09/02/2022): 07/25/2022 - Sinusitis; Rx: Augmentin; Aug 2024, sinus pain, right face, nothing on dental eval; congestion, drainage, no discolor; Atrovent has helped; intermittent using afrin; astelin/flonsae using consistently; Rx: symptomatic mgmt CMP 01/16/2023: BUN/Cr: 25/1.01 CBC 11/30/2020: WBC 12.1, Hgb 16, Plt 294; Eos 200  CT Face (01/31/2023): left max completely opacified, b/l frontal recess MPT, b/l ethmoid mild MPT; left septal deviation  PMH/Meds/All/SocHx/FamHx/ROS:   Past Medical History:  Diagnosis Date   Allergy    Arthritis    Basal cell carcinoma    Erectile dysfunction    Hyperlipidemia    Hypertension      Past Surgical History:  Procedure Laterality Date   cataract Bilateral    KNEE SURGERY     LUMBAR DISC SURGERY     SPINE SURGERY      Family History  Problem Relation Age of Onset   Heart disease Mother    Parkinson's disease Father    Alzheimer's disease Father    Factor V Leiden deficiency Brother      Social Connections: Moderately Isolated (01/15/2023)   Social Connection and Isolation  Panel [NHANES]    Frequency of Communication with Friends and Family: More than three times a week    Frequency of Social Gatherings with Friends and Family: Once a week    Attends Religious Services: Never     Database administrator or Organizations: No    Attends Engineer, structural: Not on file    Marital Status: Married      Current Outpatient Medications:    albuterol (VENTOLIN HFA) 108 (90 Base) MCG/ACT inhaler, Inhale 1-2 puffs into the lungs every 6 (six) hours as needed for wheezing or shortness of breath., Disp: 18 each, Rfl: 1   Ascorbic Acid (VITAMIN C) 100 MG tablet, Take 100 mg by mouth daily. , Disp: , Rfl:    Azelastine-Fluticasone 137-50 MCG/ACT SUSP, PLACE 1 SPRAY INTO THE NOSE EVERY 12 (TWELVE) HOURS., Disp: 23 g, Rfl: 2   celecoxib (CELEBREX) 50 MG capsule, Take 50 mg by mouth 2 (two) times daily. Pt does not know strength, Disp: , Rfl:    fluticasone-salmeterol (ADVAIR HFA) 115-21 MCG/ACT inhaler, INHALE 2 PUFFS INTO THE LUNGS TWICE A DAY, Disp: 12 each, Rfl: 12   gatifloxacin (ZYMAXID) 0.5 % SOLN, Place 1 drop into the left eye 4 (four) times daily., Disp: , Rfl:    glucosamine-chondroitin 500-400 MG tablet, Take 1 tablet by mouth 3 (three) times daily., Disp: , Rfl:    ipratropium (ATROVENT) 0.06 % nasal spray, Place 1-2 sprays into both nostrils 4 (four) times daily. As needed for nasal congestion, Disp: 15 mL, Rfl: 5   lisinopril-hydrochlorothiazide (ZESTORETIC) 20-25 MG tablet, Take 1 tablet by mouth daily., Disp: 90 tablet, Rfl: 1   loratadine (CLARITIN) 10 MG tablet, TAKE 1 TABLET BY MOUTH EVERYDAY AT BEDTIME, Disp: 90 tablet, Rfl: 0   pravastatin (PRAVACHOL) 80 MG tablet, Take 1 tablet (80 mg total) by mouth daily., Disp: 90 tablet, Rfl: 1   predniSONE (DELTASONE) 10 MG tablet, Take 1 tablet (10 mg total) by mouth daily with breakfast for 7 days. Take Prednisone by mouth (PO) 30mg  x 4 days (3 pills in morning), then 20mg  x4 days (2 pills), then 10mg  x 6 days (1 pill), then stop, Disp: 7 tablet, Rfl: 0   levofloxacin (LEVAQUIN) 500 MG tablet, Take 1 tablet (500 mg total) by mouth daily for 14 days., Disp: 14 tablet, Rfl: 0   Physical Exam:   BP 137/83 (BP  Location: Left Arm, Patient Position: Sitting, Cuff Size: Normal)   Pulse 87   Resp 19   Wt 245 lb (111.1 kg)   SpO2 95%   BMI 30.62 kg/m   Salient findings:  CN II-XII intact  Bilateral EAC clear and TM intact with well pneumatized middle ear spaces Anterior rhinoscopy: Septum deviates left, fair amount of crusting bilaterally; bilateral inferior turbinates with R > L hypertrophy; Nasal endoscopy was indicated to better evaluate the nose and paranasal sinuses, given the patient's history and exam findings, and is detailed below. No lesions of oral cavity/oropharynx; dentition is fair No obviously palpable neck masses/lymphadenopathy/thyromegaly No respiratory distress or stridor  Seprately Identifiable Procedures:  PROCEDURE: Bilateral Diagnostic Rigid Nasal Endoscopy Pre-procedure diagnosis: Concern for chronic sinusitis Post-procedure diagnosis: same Indication: See pre-procedure diagnosis and physical exam above Complications: None apparent EBL: 0 mL Anesthesia: Lidocaine 4% and topical decongestant was topically sprayed in each nasal cavity  Description of Procedure:  Patient was identified. A rigid 30 degree endoscope was utilized to evaluate the sinonasal cavities, mucosa, sinus ostia and turbinates and septum.  Overall, signs of mucosal inflammation are noted.  Also noted are fair amount of crusting bilaterally.  No polyps, or masses noted.   Right Middle meatus: see below - purulent secretions from right max down to NP Right SE Recess: clear, some mucosal edema Left MM: clear purulence running down into nasopharynx Left SE Recess: clear but mild mucosal edema    Photodocumentation was obtained.  CPT CODE -- 16109 - Mod 25   Impression & Plans:  Gawain Crombie is a 73 y.o. male with:  1. Other chronic sinusitis   2. Recurrent sinusitis   3. Nasal obstruction   4. Nasal congestion   5. Hypertrophy of both inferior nasal turbinates   6. Nasal septal deviation   7.  Post-nasal drip    Unfortunately Mr. Bracken has had chronic sinusitis with multiple exacerbations over past year requiring at least 4 rounds of antibiotics. He has been on maximal medical therapy including intermittent rinses and PO antihistamine, dymista and all of these with some benefit but with continued radiographic evidence of sinusitis and clinical evidence including purulence both both middle meati today.  We discussed his options, including R/B/A for each options. Given continued purulence from both sides despite medical therapy, we discussed bilateral FESS.  We discussed the goals of sinus surgery, and expectations for postoperative management. We discussed R/B/A including pain, infection, bleeding (~5% risk of operative visit for control), persistent symptoms, need for revision surgery, and other risks including damage to the eye and loss of vision, and injury to skull base with risk of CSF leak and additional intracranial complications, anesthetic complications, among others.  Patient understands and is ready to proceed.  We also discussed that for his nasal obstruction, he has a structural problem (septum). We discussed the goals of septoplasty and turbinate reduction, and expectations for postoperative management. Will plan to leave splints in place, and removal was also discussed. We also discussed nasal obstruction post-operatively until splints in place and pain management.  We discussed R/B/A including pain, infection, bleeding (~5% risk of operative visit for control), persistent symptoms, need for revision surgery, and other risks including damage to surrounding structures, septal perforation, and injury to skull base with risk of CSF leak and additional intracranial complications, anesthetic complications, among others.  Patient understands and wishes to move forward with bilateral FESS and septoplasty and turbinate reduction. We will proceed with bilateral maxillary antrostomy and  total ethmoidectomy due to persistent purulence from both middle meatus today despite antibiotics, and we will do a LEFT frontal sinusotomy with image guidance. Will avoid right for now as he does not have significant right frontal pressure. Sphenoid are clear and sphenoethmoid recesses without obvious purulence so will not open sphenoids.  - Start Levaquin x7d for his current infxn - Prednisone burst for current infection - Continue dymista twice daily - Daily NeilMed Sinus Rinses - Will call in 1 week to confirm; and prescribe peri-op abx/steroids at that point. - f/u POD 5  See below regarding exact medications prescribed this encounter including dosages and route: Meds ordered this encounter  Medications   levofloxacin (LEVAQUIN) 500 MG tablet    Sig: Take 1 tablet (500 mg total) by mouth daily for 14 days.    Dispense:  14 tablet    Refill:  0   predniSONE (DELTASONE) 10 MG tablet    Sig: Take 1 tablet (10 mg total) by mouth daily with breakfast for 7 days. Take Prednisone by mouth (PO) 30mg  x 4 days (3 pills  in morning), then 20mg  x4 days (2 pills), then 10mg  x 6 days (1 pill), then stop    Dispense:  7 tablet    Refill:  0      Thank you for allowing me the opportunity to care for your patient. Please do not hesitate to contact me should you have any other questions.  Sincerely, Jovita Kussmaul, MD Otolaryngologist (ENT), [facility] Phone: [phone] Fax: [phone]  03/02/2023, 12:57 PM   MDM:  Level 4 Complexity/Problems addressed: [phone] Data complexity: mod - independent review of notes, labs; independent interpretation of imaging - Morbidity: mod   - Prescription Drug prescribed or managed: yes

## 2023-03-02 NOTE — Patient Instructions (Addendum)
Levaquin take 500mg  daily for 7 days  Prednisone take 10mg  by mouth daily for 7 days Continue the spray you have Don't use any afrin Lloyd Huger Med Nasal Saline Rinse  - start nasal saline rinses with NeilMed Bottle available over the counter    Nasal Saline Irrigation instructions: If you choose to make your own salt water solution, You will need: Salt (kosher, canning, or pickling salt) Baking soda Nasal irrigation bottle (i.e. Lloyd Huger Med Sinus Rinse) Measuring spoon ( teaspoon) Distilled / boiled water  Mix solution Mix 1 teaspoon of salt, 1/2 teaspoon of baking soda and 1 cup of water into irrigation bottle ** May use saline packet instead of homemade recipe for this step if you prefer If medicine was prescribed to be mixed with solution, place this into bottle Examples 2 inches of 2% mupirocin ointment Budesonide solution Position your head: Lean over sink (about 45 degrees) Rotate head (about 45 degrees) so that one nostril is above the other Irrigate Insert tip of irrigation bottle into upper nostril so it forms a comfortable seal Irrigate while breathing through your mouth May remove the straw from the bottle in order to irrigate the entire solution (important if medicine was added) Exhale through nose when finished and blow nose as necessary  Repeat on opposite side with other 1/2 of solution (120 mL) or remake solution if all 240 mL was used on first side Wash irrigation bottle regularly, replace every 3 months

## 2023-03-07 ENCOUNTER — Other Ambulatory Visit: Payer: Self-pay | Admitting: Family Medicine

## 2023-03-07 DIAGNOSIS — J45909 Unspecified asthma, uncomplicated: Secondary | ICD-10-CM

## 2023-03-09 ENCOUNTER — Encounter (INDEPENDENT_AMBULATORY_CARE_PROVIDER_SITE_OTHER): Payer: Self-pay | Admitting: Otolaryngology

## 2023-03-09 ENCOUNTER — Telehealth (INDEPENDENT_AMBULATORY_CARE_PROVIDER_SITE_OTHER): Payer: Self-pay | Admitting: Otolaryngology

## 2023-03-09 DIAGNOSIS — J329 Chronic sinusitis, unspecified: Secondary | ICD-10-CM

## 2023-03-09 MED ORDER — LEVOFLOXACIN 500 MG PO TABS: 500.0000 mg | ORAL_TABLET | Freq: Every day | ORAL | 0 refills | Status: AC

## 2023-03-09 MED ORDER — PREDNISONE 10 MG PO TABS: ORAL_TABLET | ORAL | 0 refills | Status: AC

## 2023-03-09 NOTE — Telephone Encounter (Signed)
 Telephone call ENT:  Joshua Levine is doing better on steroids/abx. Rinsing regularly. He still wishes to pursue surgery and we will therefore prescribe preop abx/steroids. F/u POD 5. He is ready to proceed

## 2023-03-11 ENCOUNTER — Other Ambulatory Visit: Payer: Self-pay | Admitting: Family Medicine

## 2023-03-11 DIAGNOSIS — J309 Allergic rhinitis, unspecified: Secondary | ICD-10-CM

## 2023-04-15 ENCOUNTER — Encounter: Payer: Self-pay | Admitting: Family Medicine

## 2023-04-17 ENCOUNTER — Telehealth (INDEPENDENT_AMBULATORY_CARE_PROVIDER_SITE_OTHER): Payer: Self-pay

## 2023-04-17 NOTE — Telephone Encounter (Signed)
 Pt has been scheduled with Dr Beverely Low

## 2023-04-17 NOTE — Telephone Encounter (Signed)
 Patient called stating medication (Levaquin)  prescribed by Dr. Allena Katz for his upcoming surgery has side affects that could possibly cause his knee pain to be worse and would like a different prescription sent to the pharmacy on file. Spoke to Dr. Allena Katz and he authorized a verbal r/x for Doxycycline 100mg  twice daily for 2 weeks. R/X sent to pharmacy on file and patient informed.

## 2023-04-18 ENCOUNTER — Ambulatory Visit: Admitting: Family Medicine

## 2023-04-18 ENCOUNTER — Encounter: Payer: Self-pay | Admitting: Family Medicine

## 2023-04-18 VITALS — BP 138/72 | HR 74 | Temp 98.6°F | Ht 75.0 in | Wt 248.0 lb

## 2023-04-18 DIAGNOSIS — Z23 Encounter for immunization: Secondary | ICD-10-CM

## 2023-04-18 DIAGNOSIS — R21 Rash and other nonspecific skin eruption: Secondary | ICD-10-CM

## 2023-04-18 MED ORDER — TRIAMCINOLONE ACETONIDE 0.1 % EX OINT: 1.0000 | TOPICAL_OINTMENT | Freq: Two times a day (BID) | CUTANEOUS | 1 refills | Status: DC

## 2023-04-18 NOTE — Patient Instructions (Signed)
 Follow up as needed or as scheduled We'll notify you of your lab results and make any changes if needed Apply the Triamcinolone ointment twice daily IF your symptoms change (fever, body aches, chills, spreading rash) please let us know Call with any questions or concerns Stay Safe!  Stay Healthy! Happy Spring!!

## 2023-04-18 NOTE — Progress Notes (Signed)
   Subjective:    Patient ID: Joshua Levine, male    DOB: 04-Nov-1950, 73 y.o.   MRN: [id number]  HPI Rash- large round lesion on back of R arm.  Wife pointed it out over the weekend but noticed it a few weeks ago.  Not painful, doesn't itch.  No similar lesions elsewhere.  Pt has been treated w/ Levaquin multiple times since November.  No known tick bites.  No fevers, chills, HA (other than sinus related)   Review of Systems For ROS see HPI     Objective:   Physical Exam Vitals reviewed.  Constitutional:      General: He is not in acute distress.    Appearance: Normal appearance. He is not ill-appearing.  HENT:     Head: Normocephalic and atraumatic.  Eyes:     Extraocular Movements: Extraocular movements intact.     Conjunctiva/sclera: Conjunctivae normal.  Cardiovascular:     Rate and Rhythm: Normal rate.  Pulmonary:     Effort: Pulmonary effort is normal.  Skin:    General: Skin is warm and dry.     Findings: Erythema (large targetoid lesion on R posterior upper arm w/ flat edges, no TTP, no itching) present.  Neurological:     General: No focal deficit present.     Mental Status: He is alert and oriented to person, place, and time.  Psychiatric:        Mood and Affect: Mood normal.        Behavior: Behavior normal.        Thought Content: Thought content normal.           Assessment & Plan:  Target lesion- new.  Pt w/ 1 large area on R posterior arm that appears consistent w/ erythema multiforme.  No known tick exposure.  Denies systemic sxs.  Area has been present for at least 2 weeks w/o bothering pt at all.  He has been on multiple abx courses since November so a fixed drug eruption is also something to consider.  Will get labs to assess for Lyme and will only start additional abx if labs are positive.  Triamcinolone ointment to the area prn.  Reviewed supportive care and red flags that should prompt return.  Pt expressed understanding and is in agreement w/ plan.

## 2023-04-19 LAB — B. BURGDORFI ANTIBODIES: B burgdorferi Ab IgG+IgM: <0.9 {index}

## 2023-04-20 ENCOUNTER — Encounter: Payer: Self-pay | Admitting: Family Medicine

## 2023-04-21 ENCOUNTER — Ambulatory Visit (INDEPENDENT_AMBULATORY_CARE_PROVIDER_SITE_OTHER): Payer: BC Managed Care – PPO

## 2023-04-22 ENCOUNTER — Other Ambulatory Visit: Payer: Self-pay | Admitting: Family Medicine

## 2023-04-22 DIAGNOSIS — J309 Allergic rhinitis, unspecified: Secondary | ICD-10-CM

## 2023-05-01 ENCOUNTER — Other Ambulatory Visit: Payer: Self-pay

## 2023-05-01 ENCOUNTER — Encounter (HOSPITAL_BASED_OUTPATIENT_CLINIC_OR_DEPARTMENT_OTHER): Payer: Self-pay

## 2023-05-03 ENCOUNTER — Encounter (HOSPITAL_BASED_OUTPATIENT_CLINIC_OR_DEPARTMENT_OTHER)
Admission: RE | Admit: 2023-05-03 | Discharge: 2023-05-03 | Disposition: A | Discharge status: Home / Self Care | Source: Ambulatory Visit | Attending: Otolaryngology | Admitting: Otolaryngology

## 2023-05-03 DIAGNOSIS — Z01812 Encounter for preprocedural laboratory examination: Secondary | ICD-10-CM | POA: Insufficient documentation

## 2023-05-03 DIAGNOSIS — Z0182 Encounter for allergy testing: Secondary | ICD-10-CM | POA: Diagnosis not present

## 2023-05-03 LAB — BASIC METABOLIC PANEL WITH GFR
Anion gap: 13 (ref 5–15)
BUN: 21 mg/dL (ref 8–23)
CO2: 22 mmol/L (ref 22–32)
Calcium: 10.1 mg/dL (ref 8.9–10.3)
Chloride: 103 mmol/L (ref 98–111)
Creatinine, Ser: 1.23 mg/dL (ref 0.61–1.24)
GFR, Estimated: >60 mL/min (ref >60)
Glucose, Bld: 99 mg/dL (ref 70–99)
Potassium: 4.6 mmol/L (ref 3.5–5.1)
Sodium: 138 mmol/L (ref 135–145)

## 2023-05-08 ENCOUNTER — Encounter (HOSPITAL_BASED_OUTPATIENT_CLINIC_OR_DEPARTMENT_OTHER): Payer: Self-pay | Admitting: Certified Registered Nurse Anesthetist

## 2023-05-08 ENCOUNTER — Ambulatory Visit (HOSPITAL_BASED_OUTPATIENT_CLINIC_OR_DEPARTMENT_OTHER): Admitting: Anesthesiology

## 2023-05-08 ENCOUNTER — Other Ambulatory Visit: Payer: Self-pay

## 2023-05-08 ENCOUNTER — Ambulatory Visit (HOSPITAL_BASED_OUTPATIENT_CLINIC_OR_DEPARTMENT_OTHER)
Admission: RE | Admit: 2023-05-08 | Discharge: 2023-05-08 | Disposition: A | Discharge status: Home / Self Care | Payer: BC Managed Care – PPO | Attending: Otolaryngology | Admitting: Otolaryngology

## 2023-05-08 ENCOUNTER — Encounter (HOSPITAL_BASED_OUTPATIENT_CLINIC_OR_DEPARTMENT_OTHER)
Admission: RE | Disposition: A | Discharge status: Home / Self Care | Payer: Self-pay | Source: Home / Self Care | Attending: Otolaryngology

## 2023-05-08 DIAGNOSIS — J342 Deviated nasal septum: Secondary | ICD-10-CM

## 2023-05-08 DIAGNOSIS — J329 Chronic sinusitis, unspecified: Secondary | ICD-10-CM

## 2023-05-08 DIAGNOSIS — I10 Essential (primary) hypertension: Secondary | ICD-10-CM | POA: Diagnosis not present

## 2023-05-08 DIAGNOSIS — J343 Hypertrophy of nasal turbinates: Secondary | ICD-10-CM

## 2023-05-08 DIAGNOSIS — J3489 Other specified disorders of nose and nasal sinuses: Secondary | ICD-10-CM | POA: Diagnosis not present

## 2023-05-08 DIAGNOSIS — Z87891 Personal history of nicotine dependence: Secondary | ICD-10-CM | POA: Insufficient documentation

## 2023-05-08 DIAGNOSIS — Z01818 Encounter for other preprocedural examination: Secondary | ICD-10-CM

## 2023-05-08 DIAGNOSIS — Z79899 Other long term (current) drug therapy: Secondary | ICD-10-CM | POA: Diagnosis not present

## 2023-05-08 DIAGNOSIS — E785 Hyperlipidemia, unspecified: Secondary | ICD-10-CM | POA: Diagnosis not present

## 2023-05-08 HISTORY — PX: MAXILLARY ANTROSTOMY: SHX2003

## 2023-05-08 HISTORY — PX: NASAL SEPTOPLASTY W/ TURBINOPLASTY: SHX2070

## 2023-05-08 HISTORY — PX: FRONTAL SINUS EXPLORATION: SHX6591

## 2023-05-08 HISTORY — PX: ETHMOIDECTOMY: SHX5197

## 2023-05-08 HISTORY — PX: SINUS ENDO W/FUSION: SHX777

## 2023-05-08 SURGERY — SEPTOPLASTY, NOSE, WITH NASAL TURBINATE REDUCTION
Anesthesia: General | Site: Nose | Laterality: Left

## 2023-05-08 MED ORDER — MIDAZOLAM HCL 2 MG/2ML IJ SOLN
INTRAMUSCULAR | Status: AC
  Filled 2023-05-08: qty 2

## 2023-05-08 MED ORDER — PHENYLEPHRINE 80 MCG/ML (10ML) SYRINGE FOR IV PUSH (FOR BLOOD PRESSURE SUPPORT)
PREFILLED_SYRINGE | INTRAVENOUS | Status: DC | PRN
  Administered 2023-05-08 (×2): 80 ug via INTRAVENOUS
  Administered 2023-05-08: 160 ug via INTRAVENOUS
  Administered 2023-05-08 (×2): 80 ug via INTRAVENOUS
  Administered 2023-05-08: 160 ug via INTRAVENOUS

## 2023-05-08 MED ORDER — FENTANYL CITRATE (PF) 100 MCG/2ML IJ SOLN
INTRAMUSCULAR | Status: AC
Start: 2023-05-08 — End: ?
  Filled 2023-05-08: qty 2

## 2023-05-08 MED ORDER — ONDANSETRON HCL 4 MG/2ML IJ SOLN
INTRAMUSCULAR | Status: AC
  Filled 2023-05-08: qty 4

## 2023-05-08 MED ORDER — PROPOFOL 10 MG/ML IV BOLUS
INTRAVENOUS | Status: DC | PRN
  Administered 2023-05-08: 40 mg via INTRAVENOUS
  Administered 2023-05-08: 200 mg via INTRAVENOUS

## 2023-05-08 MED ORDER — ROCURONIUM BROMIDE 10 MG/ML (PF) SYRINGE
PREFILLED_SYRINGE | INTRAVENOUS | Status: DC | PRN
  Administered 2023-05-08: 20 mg via INTRAVENOUS
  Administered 2023-05-08: 60 mg via INTRAVENOUS
  Administered 2023-05-08: 20 mg via INTRAVENOUS

## 2023-05-08 MED ORDER — AMISULPRIDE (ANTIEMETIC) 5 MG/2ML IV SOLN: 10.0000 mg | Freq: Once | INTRAVENOUS | Status: DC | PRN

## 2023-05-08 MED ORDER — SALINE SPRAY 0.65 % NA SOLN
2.0000 | NASAL | 0 refills | Status: AC | PRN
Start: 2023-05-08 — End: ?

## 2023-05-08 MED ORDER — MIDAZOLAM HCL 5 MG/5ML IJ SOLN
INTRAMUSCULAR | Status: DC | PRN
  Administered 2023-05-08 (×2): 1 mg via INTRAVENOUS

## 2023-05-08 MED ORDER — CEFAZOLIN SODIUM 1 G IJ SOLR
INTRAMUSCULAR | Status: AC
  Filled 2023-05-08: qty 20

## 2023-05-08 MED ORDER — LIDOCAINE 2% (20 MG/ML) 5 ML SYRINGE
INTRAMUSCULAR | Status: AC
  Filled 2023-05-08: qty 5

## 2023-05-08 MED ORDER — OXYCODONE HCL 5 MG/5ML PO SOLN: 5.0000 mg | Freq: Once | ORAL | Status: AC | PRN

## 2023-05-08 MED ORDER — DEXAMETHASONE SODIUM PHOSPHATE 10 MG/ML IJ SOLN
INTRAMUSCULAR | Status: DC | PRN
  Administered 2023-05-08: 10 mg via INTRAVENOUS

## 2023-05-08 MED ORDER — SODIUM CHLORIDE 0.9 % IR SOLN
Status: DC | PRN
  Administered 2023-05-08: 1

## 2023-05-08 MED ORDER — PHENYLEPHRINE HCL-NACL 20-0.9 MG/250ML-% IV SOLN
INTRAVENOUS | Status: DC | PRN
  Administered 2023-05-08: 25 ug/min via INTRAVENOUS

## 2023-05-08 MED ORDER — DEXAMETHASONE SODIUM PHOSPHATE 10 MG/ML IJ SOLN
INTRAMUSCULAR | Status: AC
  Filled 2023-05-08: qty 1

## 2023-05-08 MED ORDER — LIDOCAINE 2% (20 MG/ML) 5 ML SYRINGE
INTRAMUSCULAR | Status: DC | PRN
  Administered 2023-05-08: 60 mg via INTRAVENOUS

## 2023-05-08 MED ORDER — SUGAMMADEX SODIUM 200 MG/2ML IV SOLN
INTRAVENOUS | Status: DC | PRN
  Administered 2023-05-08: 200 mg via INTRAVENOUS

## 2023-05-08 MED ORDER — DEXMEDETOMIDINE HCL IN NACL 80 MCG/20ML IV SOLN
INTRAVENOUS | Status: DC | PRN
  Administered 2023-05-08 (×3): 4 ug via INTRAVENOUS

## 2023-05-08 MED ORDER — LIDOCAINE-EPINEPHRINE 1 %-1:100000 IJ SOLN
INTRAMUSCULAR | Status: DC | PRN
  Administered 2023-05-08: 17 mg

## 2023-05-08 MED ORDER — 0.9 % SODIUM CHLORIDE (POUR BTL) OPTIME
TOPICAL | Status: DC | PRN
  Administered 2023-05-08: 1000 mL

## 2023-05-08 MED ORDER — MUPIROCIN 2 % EX OINT
TOPICAL_OINTMENT | CUTANEOUS | Status: DC | PRN
  Administered 2023-05-08: 1 via TOPICAL

## 2023-05-08 MED ORDER — FLUORESCEIN SODIUM 1 MG OP STRP
ORAL_STRIP | OPHTHALMIC | Status: DC | PRN
Start: 2023-05-08 — End: 2023-05-08
  Administered 2023-05-08: 1

## 2023-05-08 MED ORDER — EPHEDRINE 5 MG/ML INJ
INTRAVENOUS | Status: AC
  Filled 2023-05-08: qty 5

## 2023-05-08 MED ORDER — OXYCODONE HCL 5 MG PO TABS
ORAL_TABLET | ORAL | Status: AC
  Filled 2023-05-08: qty 1

## 2023-05-08 MED ORDER — ROCURONIUM BROMIDE 10 MG/ML (PF) SYRINGE
PREFILLED_SYRINGE | INTRAVENOUS | Status: AC
  Filled 2023-05-08: qty 10

## 2023-05-08 MED ORDER — ATROPINE SULFATE 0.4 MG/ML IV SOLN
INTRAVENOUS | Status: AC
  Filled 2023-05-08: qty 1

## 2023-05-08 MED ORDER — HYDROMORPHONE HCL 1 MG/ML IJ SOLN: 0.2500 mg | INTRAMUSCULAR | Status: DC | PRN

## 2023-05-08 MED ORDER — FENTANYL CITRATE (PF) 100 MCG/2ML IJ SOLN
INTRAMUSCULAR | Status: DC | PRN
  Administered 2023-05-08 (×2): 50 ug via INTRAVENOUS

## 2023-05-08 MED ORDER — EPINEPHRINE HCL (NASAL) 0.1 % NA SOLN
NASAL | Status: AC
  Filled 2023-05-08: qty 10

## 2023-05-08 MED ORDER — OXYCODONE HCL 5 MG PO TABS
5.0000 mg | ORAL_TABLET | Freq: Once | ORAL | Status: AC | PRN
  Administered 2023-05-08: 5 mg via ORAL

## 2023-05-08 MED ORDER — TRAMADOL HCL 50 MG PO TABS: 50.0000 mg | ORAL_TABLET | Freq: Four times a day (QID) | ORAL | 0 refills | Status: AC | PRN

## 2023-05-08 MED ORDER — EPINEPHRINE HCL (NASAL) 0.1 % NA SOLN
NASAL | Status: DC | PRN
  Administered 2023-05-08: 30 mL via TOPICAL

## 2023-05-08 MED ORDER — SUCCINYLCHOLINE CHLORIDE 200 MG/10ML IV SOSY
PREFILLED_SYRINGE | INTRAVENOUS | Status: AC
  Filled 2023-05-08: qty 10

## 2023-05-08 MED ORDER — ACETAMINOPHEN 500 MG PO TABS
1000.0000 mg | ORAL_TABLET | Freq: Once | ORAL | Status: AC
  Administered 2023-05-08: 1000 mg via ORAL

## 2023-05-08 MED ORDER — CEFAZOLIN SODIUM-DEXTROSE 2-3 GM-%(50ML) IV SOLR
INTRAVENOUS | Status: DC | PRN
Start: 2023-05-08 — End: 2023-05-08
  Administered 2023-05-08: 2 g via INTRAVENOUS

## 2023-05-08 MED ORDER — ONDANSETRON HCL 4 MG/2ML IJ SOLN
INTRAMUSCULAR | Status: DC | PRN
  Administered 2023-05-08: 4 mg via INTRAVENOUS

## 2023-05-08 MED ORDER — EPHEDRINE SULFATE (PRESSORS) 50 MG/ML IJ SOLN
INTRAMUSCULAR | Status: DC | PRN
  Administered 2023-05-08: 5 mg via INTRAVENOUS

## 2023-05-08 MED ORDER — ACETAMINOPHEN 500 MG PO TABS: 1000.0000 mg | ORAL_TABLET | Freq: Four times a day (QID) | ORAL | 0 refills | Status: AC | PRN

## 2023-05-08 MED ORDER — PHENYLEPHRINE 80 MCG/ML (10ML) SYRINGE FOR IV PUSH (FOR BLOOD PRESSURE SUPPORT)
PREFILLED_SYRINGE | INTRAVENOUS | Status: AC
  Filled 2023-05-08: qty 10

## 2023-05-08 MED ORDER — ACETAMINOPHEN 500 MG PO TABS
ORAL_TABLET | ORAL | Status: AC
  Filled 2023-05-08: qty 2

## 2023-05-08 MED ORDER — ONDANSETRON HCL 4 MG/2ML IJ SOLN: 4.0000 mg | Freq: Once | INTRAMUSCULAR | Status: DC | PRN

## 2023-05-08 MED ORDER — LACTATED RINGERS IV SOLN: INTRAVENOUS | Status: DC

## 2023-05-08 SURGICAL SUPPLY — 80 items
ANTIFOG SOL W/FOAM PAD STRL (MISCELLANEOUS) ×3 IMPLANT
BALLN FRONTAL NUVENT 6X17 (BALLOONS) IMPLANT
BALLN FRONTAL NUVENT 6X17 70D (BALLOONS) IMPLANT
BALLOON FRONTAL NUVENT 6X17 (BALLOONS) IMPLANT
BALLOON FRONTAL NVNT 6X17 70D (BALLOONS) IMPLANT
BLADE INF TURB ROT M4 2 5PK (BLADE) ×1 IMPLANT
BLADE NAVIG QUADCUT 4.3X13 M4 (BLADE) ×1 IMPLANT
BLADE RAD60 ROTATE M4 4 5PK (BLADE) ×1 IMPLANT
BLADE ROTATE RAD 40 4 M4 (BLADE) IMPLANT
BLADE ROTATE TRICUT 4X13 M4 (BLADE) ×4 IMPLANT
BLADE SURG 15 STRL LF DISP TIS (BLADE) IMPLANT
BLADE TRICUT ROTATE M4 4 5PK (BLADE) IMPLANT
CANISTER SUC SOCK COL 7IN (MISCELLANEOUS) ×3 IMPLANT
CANISTER SUCT 1200ML W/VALVE (MISCELLANEOUS) ×6 IMPLANT
COAGULATOR SUCT 8FR VV (MISCELLANEOUS) IMPLANT
DEFOGGER MIRROR 1QT (MISCELLANEOUS) IMPLANT
DRESSING NASAL KENNEDY 3.5X.9 (MISCELLANEOUS) IMPLANT
DRSG NASAL KENNEDY 3.5X.9 (MISCELLANEOUS) IMPLANT
DRSG NASOPORE 8CM (GAUZE/BANDAGES/DRESSINGS) IMPLANT
DRSG TELFA 3X8 NADH STRL (GAUZE/BANDAGES/DRESSINGS) IMPLANT
ELECT COATED BLADE 2.86 ST (ELECTRODE) IMPLANT
ELECT REM PT RETURN 9FT ADLT (ELECTROSURGICAL) ×3 IMPLANT
ELECTRODE REM PT RTRN 9FT ADLT (ELECTROSURGICAL) ×3 IMPLANT
GAUZE SPONGE 2X2 STRL 8-PLY (GAUZE/BANDAGES/DRESSINGS) ×3 IMPLANT
GAUZE SPONGE 4X4 12PLY STRL LF (GAUZE/BANDAGES/DRESSINGS) ×1 IMPLANT
GLOVE BIO SURGEON STRL SZ 6.5 (GLOVE) ×3 IMPLANT
GLOVE BIOGEL PI IND STRL 7.0 (GLOVE) ×2 IMPLANT
GLOVE SURG SS PI 6.5 STRL IVOR (GLOVE) ×1 IMPLANT
GLOVE SURG SS PI 7.0 STRL IVOR (GLOVE) ×1 IMPLANT
GOWN STRL REUS W/ TWL LRG LVL3 (GOWN DISPOSABLE) ×7 IMPLANT
HEMOSTAT ARISTA ABSORB 3G PWDR (HEMOSTASIS) IMPLANT
HEMOSTAT SNOW SURGICEL 2X4 (HEMOSTASIS) ×1 IMPLANT
HEMOSTAT SURGICEL .5X2 ABSORB (HEMOSTASIS) IMPLANT
IMPLANT PROPEL CONTOUR (Prosthesis and Implant ENT) IMPLANT
IMPLANT PROPEL MINI SINUS SDS (Prosthesis and Implant ENT) IMPLANT
IMPLANT PROPEL SINUS 23MML (Prosthesis and Implant ENT) IMPLANT
INFLATOR BALLOON W/TUBE (BALLOONS) IMPLANT
IV NS 500ML BAXH (IV SOLUTION) ×4 IMPLANT
NDL PRECISIONGLIDE 27X1.5 (NEEDLE) ×2 IMPLANT
NDL SAFETY ECLIPSE 18X1.5 (NEEDLE) IMPLANT
NDL SPNL 25GX3.5 QUINCKE BL (NEEDLE) ×2 IMPLANT
NEEDLE PRECISIONGLIDE 27X1.5 (NEEDLE) ×3 IMPLANT
NEEDLE SPNL 25GX3.5 QUINCKE BL (NEEDLE) ×3 IMPLANT
NS IRRIG 1000ML POUR BTL (IV SOLUTION) ×3 IMPLANT
PACK BASIN DAY SURGERY FS (CUSTOM PROCEDURE TRAY) ×3 IMPLANT
PACK ENT DAY SURGERY (CUSTOM PROCEDURE TRAY) ×3 IMPLANT
PAD MAGNETIC INSTR ST 16X20 (MISCELLANEOUS) IMPLANT
PATTIES SURGICAL .5 X3 (DISPOSABLE) ×5 IMPLANT
PENCIL SMOKE EVACUATOR (MISCELLANEOUS) IMPLANT
SHEATH ENDOSCRUB 0 DEG (SHEATH) ×1 IMPLANT
SHEATH ENDOSCRUB 30 DEG (SHEATH) ×1 IMPLANT
SHEET MEDIUM DRAPE 40X70 STRL (DRAPES) IMPLANT
SHEET SILICONE 2X3 0.03 REINF (MISCELLANEOUS) IMPLANT
SLEEVE SCD COMPRESS KNEE MED (STOCKING) ×3 IMPLANT
SOLUTION ANTFG W/FOAM PAD STRL (MISCELLANEOUS) ×3 IMPLANT
SPIKE FLUID TRANSFER (MISCELLANEOUS) IMPLANT
SPLINT NASAL AIRWAY SILICONE (MISCELLANEOUS) ×3 IMPLANT
SPLINT NASAL POSISEP X .6X2 (GAUZE/BANDAGES/DRESSINGS) ×3 IMPLANT
SPONGE NEURO XRAY DETECT 1X3 (DISPOSABLE) IMPLANT
SUT CHROMIC 2 0 SH (SUTURE) ×1 IMPLANT
SUT CHROMIC 4 0 P 3 18 (SUTURE) ×2 IMPLANT
SUT CHROMIC 4 0 RB 1X27 (SUTURE) ×1 IMPLANT
SUT ETHILON 3 0 FSL (SUTURE) IMPLANT
SUT ETHILON 6 0 P 1 (SUTURE) IMPLANT
SUT PLAIN 4 0 ~~LOC~~ 1 (SUTURE) ×3 IMPLANT
SUT PROLENE 2 0 SH DA (SUTURE) ×1 IMPLANT
SUT SILK 2 0 SH (SUTURE) IMPLANT
SWAB COLLECTION DEVICE MRSA (MISCELLANEOUS) IMPLANT
SWAB CULTURE ESWAB REG 1ML (MISCELLANEOUS) IMPLANT
SYR 3ML 18GX1 1/2 (SYRINGE) ×6 IMPLANT
SYR TB 1ML LL NO SAFETY (SYRINGE) IMPLANT
TOWEL GREEN STERILE FF (TOWEL DISPOSABLE) ×4 IMPLANT
TRACKER ENT INSTRUMENT (MISCELLANEOUS) ×3 IMPLANT
TRACKER ENT PATIENT (MISCELLANEOUS) ×3 IMPLANT
TRAY DSU PREP LF (CUSTOM PROCEDURE TRAY) ×3 IMPLANT
TUBE CONNECTING 20X1/4 (TUBING) ×3 IMPLANT
TUBE SALEM SUMP 12FR 48 (TUBING) IMPLANT
TUBE SALEM SUMP 16F (TUBING) ×3 IMPLANT
TUBING STRAIGHTSHOT EPS 5PK (TUBING) IMPLANT
YANKAUER SUCT BULB TIP NO VENT (SUCTIONS) ×3 IMPLANT

## 2023-05-08 NOTE — Anesthesia Postprocedure Evaluation (Signed)
 Anesthesia Post Note  Patient: Joshua Levine  Procedure(s) Performed: NASAL SEPTOPLASTY WITH INFERIOR TURBINATE REDUCTION (Bilateral: Nose) ETHMOIDECTOMY (Bilateral: Nose) FRONTAL SINUS EXPLORATION (Left: Nose) ENDOSCOPIC SINUS SURGERY WITH STEALTH NAVIGATION (Bilateral: Nose) MAXILLARY ANTROSTOMY (Bilateral: Nose)     Patient location during evaluation: PACU Anesthesia Type: General Level of consciousness: awake and alert, oriented and patient cooperative Pain management: pain level controlled Vital Signs Assessment: post-procedure vital signs reviewed and stable Respiratory status: spontaneous breathing, nonlabored ventilation and respiratory function stable Cardiovascular status: blood pressure returned to baseline and stable Postop Assessment: no apparent nausea or vomiting Anesthetic complications: no   No notable events documented.  Last Vitals:  Vitals:   05/08/23 1048 05/08/23 1645  BP: (!) 153/88 118/73  Pulse: 84 87  Resp: 18 18  Temp: 36.9 C (!) 36.3 C  SpO2: 98% 94%    Last Pain:  Vitals:   05/08/23 1645  TempSrc:   PainSc: 0-No pain                 Lannie Fields

## 2023-05-08 NOTE — H&P (Signed)
 Pre-Operative H&P - Day Of Surgery Patient Name: Joshua Levine Date:   05/08/2023  HPI: Joshua Levine is a 73 y.o. male who presents today for operative treatment of bilateral chronic sinusitis and nasal septal deviation and bilateral inferior turbinate hypertrophy. Patient denies recent significant changes to health or significant new medications or physiologic change in condition which would immediately impact plans. No new types of therapy has been initiated that would change the plan or the appropriateness of the plan.   ROS:  A complete review of systems was obtained and is otherwise negative.   PMH:  Past Medical History:  Diagnosis Date   Allergy    Arthritis    Basal cell carcinoma    Erectile dysfunction    Hyperlipidemia    Hypertension     PSH:  Past Surgical History:  Procedure Laterality Date   cataract Bilateral    KNEE SURGERY     LUMBAR DISC SURGERY     SPINE SURGERY      MEDS:   Current Facility-Administered Medications:    lactated ringers infusion, , Intravenous, Continuous, Desmond Lope Samson Frederic, MD  ALLERGIES: Patient has no known allergies.  EXAM: Vitals: BP (!) 153/88   Pulse 84   Temp 98.4 F (36.9 C) (Temporal)   Resp 18   Ht 6\' 3"  (1.905 m)   Wt 110.3 kg   SpO2 98%   BMI 30.39 kg/m   General Awake, at baseline alertness.   HEENT No scleral icterus or conjunctival hemorrhage. Globe position appears normal. External ears  normal. Nose patent without rhinorrhea. No lymphadenopathy. No thyromegaly  Cardiovascular No cyanosis.  Pulmonary No audible stridor. Breathing easily with no labor.  Neuro Symmetric facial movement.   Psychiatry Appropriate affect and mood.  Skin No scars or lesions on face or neck.  Extermities Moves all extremities with normal range of motion.   Other Findings None.   Assessment & Plan: Joshua Levine has diagnoses of bilateral chronic sinusitis and nasal septal deviation and bilateral inferior turbinate hypertrophy and will go to the  OR today for bilateral FESS, septoplasty and bilateral inferior turbinate reduction. Informed consent was obtained and available in EMR today. All questions have been answered, and risks/benefits/alternatives of procedure as noted in the consent were discussed in a quiet area. Questions were invited and answered. The patient expressed understanding, provided consent and wished to proceed despite risks.  Read Drivers 05/08/2023 11:18 AM

## 2023-05-08 NOTE — Anesthesia Procedure Notes (Signed)
 Procedure Name: Intubation Date/Time: 05/08/2023 11:53 AM  Performed by: Demetrio Lapping, CRNAPre-anesthesia Checklist: Patient identified, Emergency Drugs available, Suction available and Patient being monitored Patient Re-evaluated:Patient Re-evaluated prior to induction Oxygen Delivery Method: Circle System Utilized Preoxygenation: Pre-oxygenation with 100% oxygen Induction Type: IV induction Ventilation: Mask ventilation without difficulty and Oral airway inserted - appropriate to patient size Laryngoscope Size: Mac and 4 Grade View: Grade I Tube type: Oral Tube size: 7.5 mm Number of attempts: 1 Airway Equipment and Method: Stylet Placement Confirmation: ETT inserted through vocal cords under direct vision, positive ETCO2 and breath sounds checked- equal and bilateral Secured at: 25 cm Tube secured with: Tape Dental Injury: Teeth and Oropharynx as per pre-operative assessment

## 2023-05-08 NOTE — Op Note (Addendum)
 Otolaryngology Operative note  Joshua Levine Date/Time of Admission: 05/08/2023 10:06 AM  CSN: [id number];MRN:[id number]  DOB: 1950-10-15 Age: 73 y.o. Location: [facility]    Pre-Op Diagnosis: Nasal obstruction Bilateral inferior turbinate hypertrophy Nasal septal deviation Bilateral other sinusitis  Post-Op Diagnosis: Same  Procedure: Procedure(s): Left nasal endoscopy with total ethmoidectomy with frontal sinusotomy (CPT [phone]) Endoscopic Septoplasty (CPT 30520) Right nasal endoscopy with total ethmoidectomy (CPT 31255) Bilateral Nasal endoscopy with maxillary antrostomy (CPT 60454-09) Bilateral Nasal Endoscopy with Submucous Bilateral inferior turbinate reduction (CPT 30140-50) Stereotactic Image Guidance (CPT [phone])  Surgeon: Jovita Kussmaul, MD  Anesthesia type:  General  Anesthesiologist: Anesthesiologist: Lannie Fields, DO CRNA: Roosvelt Harps, CRNA; Demetrio Lapping, CRNA   Staff: Circulator: Lenn Cal, RN Relief Circulator: McDonough-Hughes, Maceo Pro, RN Relief Scrub: Rolla Etienne Scrub Person: Verdie Drown Vendor Representative : Alene Mires Circulator Assistant: Mora Bellman, RN  Implants: Posi sep X dressing bilaterally, small amount of surgicel right nasal cavity  Specimens: ID Type Source Tests Collected by Time Destination  1 : Bilateral sinus contents Tissue Path Tissue SURGICAL PATHOLOGY Read Drivers, MD 05/08/2023 1529    EBL:  500 mL  Drains: None  Post-op disposition and condition: PACU, hemodynamically stable   Findings: Left nasal septal deviation; bilateral inferior turbinate hypertrophy - nasal airway more patent after septoplasty and bilateral inferior turbinate reduction Left nasal cavity: ethmoid with some thick septations; left maxillary sinus with some purulence, ethmoid cavity with some polypoid edema though this was mild; left maxillary antrostomy, ethmoidectomy and frontal  sinusotomy performed; sinuses patent at end of procedure Right nasal cavity: right maxillary sinus with mild polypoid edema of mucosa; ethmoid cavity anteriorly with some polypoid edema; maxillary antrostomy and total ethmoidectomy performed  Complications: None apparent at end of case  Indications and consent:  Joshua Levine is a 73 y.o. male who reports issue with chronic sinusitis with multiple exacerbations and some left nasal obstruction. The patient's options were discussed, including risks/benefits/alternatives for each option. Patient expressed understanding, and despite these risks, consented and decided to proceed with above procedures. Prior procedure, risks/benefits/alternatives were discussed with patient including pain, infection, bleeding, lack of improvement persistent symptoms, need for revision surgery, and other risks including damage to the eye and loss of vision, and injury to skull base with risk of CSF leak and additional intracranial complications, anesthetic complications.  Patient understands and despite those risks, opted to proceed.  Procedure: Anesthesia and Prep -- After being properly identified in the preoperative holding area, the patient was brought into the operating suite. He was placed supine on the operating table. A pre-procedural time-out was performed. Pre-operative antibiotics and steroids were administered. After induction of general anesthesia, the patient was successfully intubated with confirmation of tube placement via CO2 return. Eyes were taped closed with clear tape. Pledgets soaked in 1:1000 fluorescein dyed epinephrine were placed in each nostril. The face was prepped and draped in usual fashion for sinus surgery and septoplasty.  Image Guidance Registration and Prep -- The registration sticker for the Stereotactic image guidance system was placed on the forehead. The face was registered with good correlation. Landmarks were checked with the probe at  orbital rim and nasal tip which showed satisfactory accuracy.   Endoscopic-assisted septoplasty  -- The nose was decongested with epinephrine 1:1000 soaked pledgets. The septum was injected bilaterally with 1% lidocaine with 1:100,000 epinephrine. A 15 blade was utilized to make a hemi-transfixion incision in the left nasal cavity.  A Cottle elevator was then used to elevate a sub-mucoperichondrial flap. Once adequate room was available, the 0 degree endoscopic was placed into the flap and the septal cartilage was visualized. The cottle elevator was used to raise the flap back to the bony-cartilaginous junction. A caudal transcartilaginous cut was made using the Cottle elevator taking care to leave at least a 1cm caudal strut. The opposing mucoperichondrial flap was then elevated off the cartilage and bone of the right side. Then, a Lenoria Chime forcep was used to cut away the cartilage working anterior to posterior. As the cartilage was removed superiorly, care was taken to leave at least a 1cm dorsal strut.  A Jansen-Middleton double-action rongeur was used to remove the bony aspect of the deviated septum.  During elevation, there was a small tear in the left mucoperichondrial flap overlying the previous leftward spur.  There was no opposing defect created.  Bony maxillary crest was identified and partially removed with a Lenoria Chime forcep and osteotome. The nose was then inspected and the septum was seen to be more midline. At the conclusion of the case, the hemitransfixion incision was closed with interrupted 4-0 chromic gut suture and doyle splints coated in mupirocin were placed bilaterally and sutured to the anterior septum with a 2-0 prolene suture.   Bilateral submucous inferior turbinate reduction with microdebrider and outfracture -- 1% lidocaine with 1:100,000 units of epinephrine was injected into the bilateral inferior turbinates. A 15 blade was used to make an incision in the head of the left inferior  turbinate. A cottle elevator was then used to elevate a submucosal flap along the medial surface of the turbinate. A microdebrider turbinate blade was inserted into the left turbinate and pieces of bone and soft tissue of the turbinate were resected in a submucosal fashion. Particular attention was paid to the head of the turbinate to improve nasal valve airflow. Once adequate resection was achieved, a Engineering geologist was used to out-fracture the inferior turbinate. This procedure was then repeated on the right side to complete the bilateral submucosal inferior turbinate resection. The nasal airway was then seen to be more patent  We then began with the sinus surgery. We started on the left.  Left nasal endoscopy with maxillary antrostomy -- 1% Lidocaine with 1:100,000 epinephrine was used to infiltrate the axilla of the middle turbinate, head of the middle turbinate and the sphenopalatine artery. The middle turbinate was medialized using a freer. The maxillary seeker was inserted and slipped behind the uncinate process. This was pulled anteriorly exposing the edge. A pediatric back-biter was inserted and the uncinate was cut anteriorly toward the nasolacrimal duct. The microdebrider was used to remove the uncinate process above and below the cut made by the back-biter. A curved suction was inserted into the maxillary sinus through the antrostomy/natural os and pushed posteriorly and inferiorly to enlarge the opening. The microdebrider was used to clean up the edges of bone and mucosa. The 30 and 45 degree endoscope were then attached and used to view the sinus. The natural os was identified and the maxillary antrostomy was complete.  The sinus was noted to have some purulence, and was copiously irrigated with normal saline until clear.  Left nasal endoscopy with total ethmoidectomy -- The 0 degree endoscope was reattached and the ethmoid bulla was identified visually as well as confirmed via the image guidance  system. The microdebrider was used to open the bulla working from medial and inferior to lateral and superior. Removal extended laterally near  the lamina papyracea and superiorly toward skull base. Ethmoid cells were opened in succession using image guidance intermittently to verify location.  The basal lamella of the middle turbinate was identified and removed using the microdebrider overlying the expected position of the superior turbinate.  This was removed to visualize the posterior ethmoid cavity.  The posterior ethmoid cells were removed in like fashion using the image guidance system to locate the posterior ethmoid partitions. The microdebrider was used to remove thin bone and clean up mucosal edges. Ethmoid cells up to the skull base and lamina papyracea were opened. There was some notable polypoid mucosa in the ethmoid cavity.  The ethmoid cavity was irrigated copiously with normal saline and noted to be patent   Left nasal endoscopy with frontal sinusotomy and frontal sinus exploration -- 30 and 45 degree scopes were used to identify landmarks of the frontal recess. The CT scan was consulted to identify the drainage pattern. An agger nasi cell was already removed as part of the ethmoidectomy. The sinus was entered through the natural ostium using the navigated frontal seeker probe and the sinus cannulated and slight pressure was applied anteriorly to enlarge the opening. Next, the rad 60 microdebrider was used to to widen the frontal ostium in the direction of the frontal beak. This significantly opened the frontal sinus outflow tract.  The sinus was copiously irrigated with normal saline.  We then proceeded with the right side.  Right nasal endoscopy with maxillary antrostomy -- 1% Lidocaine with 1:100,000 epinephrine was used to infiltrate the axilla of the middle turbinate, head of the middle turbinate and the sphenopalatine artery. The middle turbinate was medialized using a freer. The maxillary  seeker was inserted and slipped behind the uncinate process. This was pulled anteriorly exposing the edge. A pediatric back-biter was inserted and the uncinate was cut anteriorly toward the nasolacrimal duct. The microdebrider was used to remove the uncinate process above and below the cut made by the back-biter. A curved suction was inserted into the maxillary sinus through the antrostomy/natural os and pushed posteriorly and inferiorly to enlarge the opening. The microdebrider was used to clean up the edges of bone and mucosa. A 30 and 45 degree endoscope was then attached and used to view the sinus. The natural os was identified and the maxillary antrostomy was complete.  The sinus was copiously irrigated with normal saline.  Right nasal endoscopy with total ethmoidectomy -- The 0 degree endoscope was reattached and the ethmoid bulla was identified visually as well as confirmed via the image guidance system. The microdebrider was used to open the bulla working from medial and inferior to lateral and superior. Removal extended laterally near the lamina papyracea and superiorly toward skull base. Ethmoid cells were opened in succession using image guidance intermittently to verify location.  The basal lamella of the middle turbinate was identified and removed using the microdebrider overlying the expected position of the superior turbinate.  This was removed to visualize the posterior ethmoid cavity.  The posterior ethmoid cells were removed in like fashion using the image guidance system to meticulously locate posterior ethmoid partitions up to skull base and lamina papyracea. The microdebrider was used to remove thin bone and clean up mucosal edges. There was some notable polypoid mucosa in the anterior ethmoid cavity.  The ethmoid cavity was irrigated copiously with normal saline and was noted patent  Conclusion -- Both sides were then inspected and no orbital fat or evidence of cerebral spinal fluid leak  was observed. Both sides were irrigated and clot removed. A small amount of surgicel was used to achieve hemostasis in the right ethmoid cavity. Posi-Sep X packs were then inserted lateral to the middle turbinates bilaterally.  A flexible suction catheter was used to remove fluid and blood from the oropharynx and hypopharynx.   Tape was then removed from the eyelids and the eyes were checked for tension or proptosis, none was observed. The image guidance sticker was removed. The patient's skin was cleaned. He was returned to the care of the anesthesia team. He was then weaned from the anesthetic and transported to the PACU in stable condition.  Read Drivers

## 2023-05-08 NOTE — Discharge Instructions (Addendum)
 Surgery Discharge Instructions (Dr. Allena Katz)  1. Call your doctor or go to the emergency room if you have: - Fever of 101.5 degrees or higher - Severe pain that has increased greatly since your surgery or is uncontrolled by your current pain medications - Increasing or concerning amount of bleeding from your nose. You should expect some bloody drainage from your nose for 1-3 days. Even a 5-10 minute trickle nose bleed is expected after surgery - Nausea and vomiting that does not go away - Chest pain/shortness of breath - Any other acute events, problems, or concerns  2. Wound Care/Dressings/Drain Instructions:  - It is OK to shower following your surgery. - You have some dissolvable packing in your nose. The remainder of this may be removed in clinic, otherwise this does not need to come out. -There are small soft plastic splints in your nose to hold your septum in the middle. These may crust a bit, which can be reduced by using nasal saline spray every 4 hours as needed only once blood tinged drainage stops   3. Follow Up:  - A follow up appointment will be scheduled for you with Dr. Allena Katz. If you do not know the date/time, please contact our office  4. Activity/Restrictions: - Resume your regular activities, as tolerated - Avoid heavy lifting, bending over, manipulating your nose, blowing your nose, sneezing with your mouth closed, straining and strenuous activities until instructed otherwise. Do not lift more than 10 lbs for 10 days - Sleep with a couple of pillows for first week or two after surgery to avoid drainage going into your nose.  5. Diet: - Resume your regular diet, as tolerated  6. Additional Instructions: - You should complete the course of antibiotic/steroid prescribed to you before surgery - Use acetaminophen/Tylenol to control your pain. If this does not bring you relief or the pain remains severe, a stronger pain medication (Tramadol - 50mg  tablet every 6 hours as  needed) has been prescribed to you. - DO NOT MIX NARCOTIC PAIN MEDICATIONS OR TAKE NARCOTIC PRESCRIPTIONS AT THE SAME TIME (PERCODET, LORTAB, ROXICODONE, ETC.) - DO NOT DRIVE OR OPERATE HEAVY MACHINERY WHILE ON NARCOTICS - DO NOT TAKE MORE THAN 4 GRAMS (4000mg ) OF TYLENOL (ACETAMINOPHEN) IN 24   Oxycodone 5mg  given in phase 2 at 1748.   ________________________________________________________________  Department of Otolaryngology Contact Info: Otolaryngology Nursing Triage (Monday-Friday daytime working hours or for emergencies after hours) [phone]

## 2023-05-08 NOTE — Anesthesia Preprocedure Evaluation (Addendum)
 Anesthesia Evaluation  Patient identified by MRN, date of birth, ID band Patient awake    Reviewed: Allergy & Precautions, NPO status , Patient's Chart, lab work & pertinent test results  Airway Mallampati: III  TM Distance: >3 FB Neck ROM: Full    Dental  (+) Teeth Intact, Dental Advisory Given   Pulmonary former smoker Snores at night, some witnessed apneas- has not had sleep study   Pulmonary exam normal breath sounds clear to auscultation       Cardiovascular hypertension (153/88 preop, usually 130s SBP), Pt. on medications Normal cardiovascular exam Rhythm:Regular Rate:Normal     Neuro/Psych negative neurological ROS  negative psych ROS   GI/Hepatic negative GI ROS, Neg liver ROS,,,  Endo/Other  negative endocrine ROS    Renal/GU negative Renal ROS  negative genitourinary   Musculoskeletal  (+) Arthritis , Osteoarthritis,    Abdominal  (+) + obese  Peds  Hematology negative hematology ROS (+)   Anesthesia Other Findings   Reproductive/Obstetrics negative OB ROS                             Anesthesia Physical Anesthesia Plan  ASA: 3  Anesthesia Plan: General   Post-op Pain Management: Tylenol PO (pre-op)*   Induction: Intravenous  PONV Risk Score and Plan: 2 and Ondansetron, Dexamethasone, Midazolam and Treatment may vary due to age or medical condition  Airway Management Planned: Oral ETT  Additional Equipment: None  Intra-op Plan:   Post-operative Plan: Extubation in OR  Informed Consent: I have reviewed the patients History and Physical, chart, labs and discussed the procedure including the risks, benefits and alternatives for the proposed anesthesia with the patient or authorized representative who has indicated his/her understanding and acceptance.     Dental advisory given  Plan Discussed with: CRNA  Anesthesia Plan Comments:        Anesthesia Quick  Evaluation

## 2023-05-08 NOTE — Transfer of Care (Signed)
 Immediate Anesthesia Transfer of Care Note  Patient: Joshua Levine  Procedure(s) Performed: NASAL SEPTOPLASTY WITH INFERIOR TURBINATE REDUCTION (Bilateral: Nose) ETHMOIDECTOMY (Bilateral: Nose) FRONTAL SINUS EXPLORATION (Left: Nose) ENDOSCOPIC SINUS SURGERY WITH STEALTH NAVIGATION (Bilateral: Nose) MAXILLARY ANTROSTOMY (Bilateral: Nose)  Patient Location: PACU  Anesthesia Type:General  Level of Consciousness: drowsy  Airway & Oxygen Therapy: Patient Spontanous Breathing and Patient connected to face mask oxygen  Post-op Assessment: Report given to RN and Post -op Vital signs reviewed and stable  Post vital signs: Reviewed and stable  Last Vitals:  Vitals Value Taken Time  BP    Temp    Pulse    Resp    SpO2      Last Pain:  Vitals:   05/08/23 1048  TempSrc: Temporal  PainSc: 0-No pain      Patients Stated Pain Goal: 4 (05/08/23 1048)  Complications: No notable events documented.

## 2023-05-10 LAB — SURGICAL PATHOLOGY

## 2023-05-11 ENCOUNTER — Encounter (HOSPITAL_BASED_OUTPATIENT_CLINIC_OR_DEPARTMENT_OTHER): Payer: Self-pay | Admitting: Otolaryngology

## 2023-05-12 ENCOUNTER — Encounter (INDEPENDENT_AMBULATORY_CARE_PROVIDER_SITE_OTHER): Payer: Self-pay

## 2023-05-12 ENCOUNTER — Ambulatory Visit (INDEPENDENT_AMBULATORY_CARE_PROVIDER_SITE_OTHER): Payer: BC Managed Care – PPO | Admitting: Otolaryngology

## 2023-05-12 VITALS — BP 120/73 | HR 122 | Ht 75.0 in | Wt 240.0 lb

## 2023-05-12 DIAGNOSIS — Z87891 Personal history of nicotine dependence: Secondary | ICD-10-CM

## 2023-05-12 DIAGNOSIS — J343 Hypertrophy of nasal turbinates: Secondary | ICD-10-CM

## 2023-05-12 DIAGNOSIS — J342 Deviated nasal septum: Secondary | ICD-10-CM

## 2023-05-12 DIAGNOSIS — J328 Other chronic sinusitis: Secondary | ICD-10-CM

## 2023-05-12 DIAGNOSIS — Z9889 Other specified postprocedural states: Secondary | ICD-10-CM

## 2023-05-12 DIAGNOSIS — R0981 Nasal congestion: Secondary | ICD-10-CM

## 2023-05-12 DIAGNOSIS — J3489 Other specified disorders of nose and nasal sinuses: Secondary | ICD-10-CM

## 2023-05-29 NOTE — Progress Notes (Signed)
 Dear Dr. Ester Helms, Here is my assessment for our mutual patient, Joshua Levine. Thank you for allowing me the opportunity to care for your patient. Please do not hesitate to contact me should you have any other questions. Sincerely, Dr. Milon Aloe  Otolaryngology Clinic Note Referring provider: Dr. Ester Helms HPI:  Joshua Levine is a 73 y.o. male kindly referred by Dr. Ester Helms for evaluation of chronic sinusitis.  Initial visit (02/2023): He reports chronic sinus trouble "all my life." Patient reports that he started to have particularly bad issues with his sinuses this summer with left > right sided maxillary sinus tenderness, and some ethmoid pressure. He reports he also then had discolored drainage, and with bad taste and odor. Lasted for 2 months. He had a dental eval during this time, and reported that his teeth were doing well. He reports that he does not have pain currently and not tasting drainage. He has been prescribed multiple courses of antibiotics - levaquin , augmentin , and 3 more courses (mostly augmentin ). The antibiotics seemed to help, but then came back. He reports that he is doing better now, but back at his baseline (which is as noted below)  He reports that before this, he has chronic issues with his sinuses. Reports bilateral post nasal drip, some anterior rhinorrhea (mucus), and no facial pressure/pain. Breathes fairly well through the nose, occassional congestion. Some left obstruction. Sense of smell is decent.  He gets about 2-3 infections/year for "decades" - discolored mucus, facial pain -- mostly on left but also intermittent on right.  He currently saline spray, has used astelin , flonase , and atrovent  in the past, currently using. Using dymista  currently and claritin . Has used sinus rinses intermittently  - did not help much.  Allergy testing - last time 5 years ago -- allergic to dust, molds, dogs, grasses,  trees. --------------------------------------------------------- 05/12/2023: S/p b/l FESS, ITR and septoplasty on 05/08/2023. He reports he has been doing ok after surgery - expected blood tinged drainage and nasal obstruction. No significant headache or facial pain. Splints removed today.   H&N Surgery: no Personal or FHx of bleeding dz or anesthesia difficulty: no - but family history of Factor V Leiden Mutation  GLP-1: no AP/AC: no  PMHx: HTN, HLD, Arthritis on Celebrex  Tobacco: former. 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 Lives in [place], [place]  Independent Review of Additional Tests or Records:  Dr. Ester Helms (01/16/2023 and 12/16/2022): Dx: Sinusitis in 12/2022; Rx: Augmentin ; Dec 2024: subacute sinusitis, improved after with abx but worsening drainage, discolored drainage, not clear; Rx: levaquin , Ref ENT, CT Sinus Dr. Ester Helms (FM) (07/25/2022 and 09/02/2022): 07/25/2022 - Sinusitis; Rx: Augmentin ; Aug 2024, sinus pain, right face, nothing on dental eval; congestion, drainage, no discolor; Atrovent  has helped; intermittent using afrin; astelin /flonsae using consistently; Rx: symptomatic mgmt CMP 01/16/2023: BUN/Cr: 25/1.01 CBC 11/30/2020: WBC 12.1, Hgb 16, Plt 294; Eos 200  CT Face (01/31/2023): left max completely opacified, b/l frontal recess MPT, b/l ethmoid mild MPT; left septal deviation  Path 05/08/2023: "findings consistent with chronic sinusitis"   PMH/Meds/All/SocHx/FamHx/ROS:   Past Medical History:  Diagnosis Date   Allergy    Arthritis    Basal cell carcinoma    Erectile dysfunction    Hyperlipidemia    Hypertension      Past Surgical History:  Procedure Laterality Date   cataract Bilateral    ETHMOIDECTOMY Bilateral 05/08/2023   Procedure: ETHMOIDECTOMY;  Surgeon: Evelina Hippo, MD;  Location:  [facility];  Service: ENT;  Laterality:  Bilateral;   FRONTAL SINUS EXPLORATION Left 05/08/2023   Procedure: FRONTAL SINUS EXPLORATION;  Surgeon:  Evelina Hippo, MD;  Location: [facility];  Service: ENT;  Laterality: Left;   KNEE SURGERY     LUMBAR DISC SURGERY     MAXILLARY ANTROSTOMY Bilateral 05/08/2023   Procedure: MAXILLARY ANTROSTOMY;  Surgeon: Evelina Hippo, MD;  Location: [facility];  Service: ENT;  Laterality: Bilateral;   NASAL SEPTOPLASTY W/ TURBINOPLASTY Bilateral 05/08/2023   Procedure: NASAL SEPTOPLASTY WITH INFERIOR TURBINATE REDUCTION;  Surgeon: Evelina Hippo, MD;  Location: [facility];  Service: ENT;  Laterality: Bilateral;   SINUS ENDO W/FUSION Bilateral 05/08/2023   Procedure: ENDOSCOPIC SINUS SURGERY WITH STEALTH NAVIGATION;  Surgeon: Evelina Hippo, MD;  Location: [facility];  Service: ENT;  Laterality: Bilateral;   SPINE SURGERY      Family History  Problem Relation Age of Onset   Heart disease Mother    Parkinson's disease Father    Alzheimer's disease Father    Factor V Leiden deficiency Brother      Social Connections: Moderately Isolated (01/15/2023)   Social Connection and Isolation Panel [NHANES]    Frequency of Communication with Friends and Family: More than three times a week    Frequency of Social Gatherings with Friends and Family: Once a week    Attends Religious Services: Never    Database administrator or Organizations: No    Attends Engineer, structural: Not on file    Marital Status: Married      Current Outpatient Medications:    acetaminophen  (TYLENOL ) 500 MG tablet, Take 2 tablets (1,000 mg total) by mouth every 6 (six) hours as needed., Disp: 30 tablet, Rfl: 0   albuterol  (VENTOLIN  HFA) 108 (90 Base) MCG/ACT inhaler, INHALE 1-2 PUFFS BY MOUTH EVERY 6 HOURS AS NEEDED FOR WHEEZE OR SHORTNESS OF BREATH, Disp: 18 each, Rfl: 1   Ascorbic Acid (VITAMIN C) 100 MG tablet, Take 100 mg by mouth daily. , Disp: , Rfl:    Azelastine -Fluticasone  137-50 MCG/ACT SUSP, PLACE 1 SPRAY INTO THE NOSE EVERY 12 (TWELVE) HOURS., Disp: 23  g, Rfl: 2   doxycycline (ADOXA) 75 MG tablet, Take 75 mg by mouth 2 (two) times daily., Disp: , Rfl:    fluticasone -salmeterol (ADVAIR  HFA) 115-21 MCG/ACT inhaler, INHALE 2 PUFFS INTO THE LUNGS TWICE A DAY, Disp: 12 each, Rfl: 12   gatifloxacin (ZYMAXID) 0.5 % SOLN, Place 1 drop into the left eye 4 (four) times daily., Disp: , Rfl:    glucosamine-chondroitin 500-400 MG tablet, Take 1 tablet by mouth 3 (three) times daily., Disp: , Rfl:    ipratropium (ATROVENT ) 0.06 % nasal spray, Place 1-2 sprays into both nostrils 4 (four) times daily. As needed for nasal congestion, Disp: 15 mL, Rfl: 5   lisinopril -hydrochlorothiazide  (ZESTORETIC ) 20-25 MG tablet, Take 1 tablet by mouth daily., Disp: 90 tablet, Rfl: 1   loratadine  (CLARITIN ) 10 MG tablet, TAKE 1 TABLET BY MOUTH EVERYDAY AT BEDTIME, Disp: 90 tablet, Rfl: 0   pravastatin  (PRAVACHOL ) 80 MG tablet, Take 1 tablet (80 mg total) by mouth daily., Disp: 90 tablet, Rfl: 1   predniSONE  (DELTASONE ) 2.5 MG tablet, Take 2.5 mg by mouth daily with breakfast., Disp: , Rfl:    sodium chloride  (OCEAN) 0.65 % SOLN nasal spray, Place 2 sprays into both nostrils as needed for congestion (after any blood tinged drainage stops)., Disp: 30 mL, Rfl: 0   triamcinolone  ointment (  KENALOG ) 0.1 %, Apply 1 Application topically 2 (two) times daily., Disp: 90 g, Rfl: 1   celecoxib (CELEBREX) 50 MG capsule, Take 50 mg by mouth 2 (two) times daily. Pt does not know strength (Patient not taking: Reported on 05/12/2023), Disp: , Rfl:    Physical Exam:   BP 120/73 (BP Location: Left Arm, Patient Position: Sitting, Cuff Size: Large)   Pulse (!) 122   Ht 6\' 3"  (1.905 m)   Wt 240 lb (108.9 kg)   SpO2 96%   BMI 30.00 kg/m   Salient findings:  CN II-XII intact Anterior rhinoscopy: doyle splints present - removed; no evidence of septal hematoma; bilateral turbinates reduced; modest global crusting in b/l nasal cavities. Nasal endoscopy was indicated to better evaluate the nose and  paranasal sinuses, given the patient's history and exam findings, and is detailed below. No respiratory distress or stridor  Seprately Identifiable Procedures:  PROCEDURE: Bilateral Diagnostic Rigid Nasal Endoscopy with Debridement Pre-procedure diagnosis: Post-operative examination and care after bilateral Functional Endoscopic Sinus Surgery - of note: this procedure was NOT performed for management of the septoplasty or the turbinate reduction. Post-procedure diagnosis: same Indication: See pre-procedure diagnosis and physical exam above Complications: None apparent EBL: <5 mL Anesthesia: Lidocaine  4% and topical decongestant was topically sprayed in each nasal cavity  Description of Procedure:  Patient was identified and verbal consent obtained after discussion of R/B/A. The doyle splints were first removed. A rigid 30 degree endoscope was utilized to evaluate the sinonasal cavities, mucosa, sinus ostia and turbinates and septum. Overall, signs of mucosal inflammation are noted. No septal hematoma appreciated. Also noted are post-surgical changes with some crusting and clot within bilateral middle meati and sphenoethmoid recess. These were debrided with a 8 Fr suction straight and curved. Small pieces of free-floating bone were removed. After debridement, sinus cavity patency was much improved. No adverse synechiae are noted currently. Some bloody drainage over right > left nasopharynx but no active bright red bleeding. Some posi-sep packing still left in place over axilla of bilateral middle turbinates to keep them medialized and due to suboptimal patient tolerance of suctioning Right Middle meatus: more patent after debridement Right SE Recess: more patent after debridement Left MM: more patent after debridement Left SE Recess: more patent after debridement    CPT CODE -- 40981 - Mod 79, 50      Impression & Plans:  Joshua Levine is a 73 y.o. male with:  1. Other chronic sinusitis    2. Nasal obstruction   3. Nasal congestion   4. Hypertrophy of both inferior nasal turbinates   5. Nasal septal deviation   6. S/P FESS (functional endoscopic sinus surgery)    S/p septoplasty and bilateral FESS. Sinus cavities debrided today. He reports that his nasal obstruction is much improved. Of note, after debridement, he had slight vertigo but he recovered in less than a minute and some global nasal tenderness bilaterally. I did call him two hours after the episode to ensure it had resolved, and he reports that he was doing work around the house and the vertigo had resolved.  - Would resume his nasal sprays - Call in case of other concerns, return nasal precautions and for vertigo discussed. - f/u in 2 weeks  See below regarding exact medications prescribed this encounter including dosages and route: No orders of the defined types were placed in this encounter.   Thank you for allowing me the opportunity to care for your patient. Please do not hesitate  to contact me should you have any other questions.  Sincerely, Milon Aloe, MD Otolaryngologist (ENT), [facility] Phone: [phone] Fax: [phone]  05/29/2023, 8:31 PM   MDM:  [phone]

## 2023-05-30 ENCOUNTER — Encounter (INDEPENDENT_AMBULATORY_CARE_PROVIDER_SITE_OTHER): Payer: Self-pay

## 2023-05-30 ENCOUNTER — Ambulatory Visit (INDEPENDENT_AMBULATORY_CARE_PROVIDER_SITE_OTHER): Admitting: Otolaryngology

## 2023-05-30 VITALS — BP 133/81 | HR 92

## 2023-05-30 DIAGNOSIS — J342 Deviated nasal septum: Secondary | ICD-10-CM

## 2023-05-30 DIAGNOSIS — Z9889 Other specified postprocedural states: Secondary | ICD-10-CM

## 2023-05-30 DIAGNOSIS — J343 Hypertrophy of nasal turbinates: Secondary | ICD-10-CM

## 2023-05-30 DIAGNOSIS — J328 Other chronic sinusitis: Secondary | ICD-10-CM | POA: Diagnosis not present

## 2023-05-30 DIAGNOSIS — R0981 Nasal congestion: Secondary | ICD-10-CM

## 2023-05-30 NOTE — Progress Notes (Signed)
 Dear Dr. Ester Helms, Here is my assessment for our mutual patient, Joshua Levine. Thank you for allowing me the opportunity to care for your patient. Please do not hesitate to contact me should you have any other questions. Sincerely, Dr. Milon Aloe  Otolaryngology Clinic Note Referring provider: Dr. Ester Helms HPI:  Joshua Levine is a 73 y.o. male kindly referred by Dr. Ester Helms for evaluation of chronic sinusitis.  Initial visit (02/2023): He reports chronic sinus trouble "all my life." Patient reports that he started to have particularly bad issues with his sinuses this summer with left > right sided maxillary sinus tenderness, and some ethmoid pressure. He reports he also then had discolored drainage, and with bad taste and odor. Lasted for 2 months. He had a dental eval during this time, and reported that his teeth were doing well. He reports that he does not have pain currently and not tasting drainage. He has been prescribed multiple courses of antibiotics - levaquin , augmentin , and 3 more courses (mostly augmentin ). The antibiotics seemed to help, but then came back. He reports that he is doing better now, but back at his baseline (which is as noted below)  He reports that before this, he has chronic issues with his sinuses. Reports bilateral post nasal drip, some anterior rhinorrhea (mucus), and no facial pressure/pain. Breathes fairly well through the nose, occassional congestion. Some left obstruction. Sense of smell is decent.  He gets about 2-3 infections/year for "decades" - discolored mucus, facial pain -- mostly on left but also intermittent on right.  He currently saline spray, has used astelin , flonase , and atrovent  in the past, currently using. Using dymista  currently and claritin . Has used sinus rinses intermittently  - did not help much.  Allergy testing - last time 5 years ago -- allergic to dust, molds, dogs, grasses,  trees. --------------------------------------------------------- 05/12/2023: S/p b/l FESS, ITR and septoplasty on 05/08/2023. He reports he has been doing ok after surgery - expected blood tinged drainage and nasal obstruction. No significant headache or facial pain. Splints removed today.  --------------------------------------------------------- 05/30/2023 Seen again in follow up after FESS. No epistaxis, does report some nasal congestion. No significant headache or facial pain. He does report that overall he is doing fairly well. --------------------------------------------------------  H&N Surgery: no Personal or FHx of bleeding dz or anesthesia difficulty: no - but family history of Factor V Leiden Mutation  GLP-1: no AP/AC: no  PMHx: HTN, HLD, Arthritis on Celebrex  Tobacco: former. 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 Lives in [place], [place]  Independent Review of Additional Tests or Records:  Dr. Ester Helms (01/16/2023 and 12/16/2022): Dx: Sinusitis in 12/2022; Rx: Augmentin ; Dec 2024: subacute sinusitis, improved after with abx but worsening drainage, discolored drainage, not clear; Rx: levaquin , Ref ENT, CT Sinus Dr. Ester Helms (FM) (07/25/2022 and 09/02/2022): 07/25/2022 - Sinusitis; Rx: Augmentin ; Aug 2024, sinus pain, right face, nothing on dental eval; congestion, drainage, no discolor; Atrovent  has helped; intermittent using afrin; astelin /flonsae using consistently; Rx: symptomatic mgmt CMP 01/16/2023: BUN/Cr: 25/1.01 CBC 11/30/2020: WBC 12.1, Hgb 16, Plt 294; Eos 200  CT Face (01/31/2023): left max completely opacified, b/l frontal recess MPT, b/l ethmoid mild MPT; left septal deviation  Path 05/08/2023: "findings consistent with chronic sinusitis"   PMH/Meds/All/SocHx/FamHx/ROS:   Past Medical History:  Diagnosis Date   Allergy    Arthritis    Basal cell carcinoma    Erectile dysfunction    Hyperlipidemia    Hypertension      Past Surgical History:  Procedure Laterality Date   cataract Bilateral    ETHMOIDECTOMY Bilateral 05/08/2023   Procedure: ETHMOIDECTOMY;  Surgeon: Evelina Hippo, MD;  Location: [facility];  Service: ENT;  Laterality: Bilateral;   FRONTAL SINUS EXPLORATION Left 05/08/2023   Procedure: FRONTAL SINUS EXPLORATION;  Surgeon: Evelina Hippo, MD;  Location: [facility];  Service: ENT;  Laterality: Left;   KNEE SURGERY     LUMBAR DISC SURGERY     MAXILLARY ANTROSTOMY Bilateral 05/08/2023   Procedure: MAXILLARY ANTROSTOMY;  Surgeon: Evelina Hippo, MD;  Location: [facility];  Service: ENT;  Laterality: Bilateral;   NASAL SEPTOPLASTY W/ TURBINOPLASTY Bilateral 05/08/2023   Procedure: NASAL SEPTOPLASTY WITH INFERIOR TURBINATE REDUCTION;  Surgeon: Evelina Hippo, MD;  Location: [facility];  Service: ENT;  Laterality: Bilateral;   SINUS ENDO W/FUSION Bilateral 05/08/2023   Procedure: ENDOSCOPIC SINUS SURGERY WITH STEALTH NAVIGATION;  Surgeon: Evelina Hippo, MD;  Location: [facility];  Service: ENT;  Laterality: Bilateral;   SPINE SURGERY      Family History  Problem Relation Age of Onset   Heart disease Mother    Parkinson's disease Father    Alzheimer's disease Father    Factor V Leiden deficiency Brother      Social Connections: Moderately Isolated (01/15/2023)   Social Connection and Isolation Panel [NHANES]    Frequency of Communication with Friends and Family: More than three times a week    Frequency of Social Gatherings with Friends and Family: Once a week    Attends Religious Services: Never    Database administrator or Organizations: No    Attends Engineer, structural: Not on file    Marital Status: Married      Current Outpatient Medications:    acetaminophen  (TYLENOL ) 500 MG tablet, Take 2 tablets (1,000 mg total) by mouth every 6 (six) hours as needed., Disp: 30 tablet, Rfl: 0   albuterol  (VENTOLIN  HFA) 108 (90 Base)  MCG/ACT inhaler, INHALE 1-2 PUFFS BY MOUTH EVERY 6 HOURS AS NEEDED FOR WHEEZE OR SHORTNESS OF BREATH, Disp: 18 each, Rfl: 1   Ascorbic Acid (VITAMIN C) 100 MG tablet, Take 100 mg by mouth daily. , Disp: , Rfl:    Azelastine -Fluticasone  137-50 MCG/ACT SUSP, PLACE 1 SPRAY INTO THE NOSE EVERY 12 (TWELVE) HOURS., Disp: 23 g, Rfl: 2   celecoxib (CELEBREX) 50 MG capsule, Take 50 mg by mouth 2 (two) times daily. Pt does not know strength, Disp: , Rfl:    doxycycline (ADOXA) 75 MG tablet, Take 75 mg by mouth 2 (two) times daily., Disp: , Rfl:    fluticasone -salmeterol (ADVAIR  HFA) 115-21 MCG/ACT inhaler, INHALE 2 PUFFS INTO THE LUNGS TWICE A DAY, Disp: 12 each, Rfl: 12   gatifloxacin (ZYMAXID) 0.5 % SOLN, Place 1 drop into the left eye 4 (four) times daily., Disp: , Rfl:    glucosamine-chondroitin 500-400 MG tablet, Take 1 tablet by mouth 3 (three) times daily., Disp: , Rfl:    ipratropium (ATROVENT ) 0.06 % nasal spray, Place 1-2 sprays into both nostrils 4 (four) times daily. As needed for nasal congestion, Disp: 15 mL, Rfl: 5   lisinopril -hydrochlorothiazide  (ZESTORETIC ) 20-25 MG tablet, Take 1 tablet by mouth daily., Disp: 90 tablet, Rfl: 1   loratadine  (CLARITIN ) 10 MG tablet, TAKE 1 TABLET BY MOUTH EVERYDAY AT BEDTIME, Disp: 90 tablet, Rfl: 0   pravastatin  (PRAVACHOL ) 80 MG tablet, Take 1 tablet (80 mg total) by mouth daily., Disp:  90 tablet, Rfl: 1   predniSONE  (DELTASONE ) 2.5 MG tablet, Take 2.5 mg by mouth daily with breakfast., Disp: , Rfl:    sodium chloride  (OCEAN) 0.65 % SOLN nasal spray, Place 2 sprays into both nostrils as needed for congestion (after any blood tinged drainage stops)., Disp: 30 mL, Rfl: 0   triamcinolone  ointment (KENALOG ) 0.1 %, Apply 1 Application topically 2 (two) times daily., Disp: 90 g, Rfl: 1   Physical Exam:   BP 133/81 (BP Location: Right Arm, Patient Position: Sitting, Cuff Size: Large)   Pulse 92   SpO2 95%   Salient findings:  CN II-XII intact Anterior  rhinoscopy: no evidence of septal hematoma; bilateral turbinates reduced; fair amount of global crusting in b/l nasal cavities. Nasal endoscopy was indicated to better evaluate the nose and paranasal sinuses, given the patient's history and exam findings, and is detailed below. No respiratory distress or stridor  Seprately Identifiable Procedures:  PROCEDURE: Bilateral Diagnostic Rigid Nasal Endoscopy with Debridement Pre-procedure diagnosis: Post-operative examination and care after bilateral Functional Endoscopic Sinus Surgery - of note: this procedure was NOT performed for management of the septoplasty or the turbinate reduction. Post-procedure diagnosis: same Indication: See pre-procedure diagnosis and physical exam above Complications: None apparent EBL: <5 mL Anesthesia: Lidocaine  4% and topical decongestant was topically sprayed in each nasal cavity  Description of Procedure:  Patient was identified and verbal consent obtained after discussion of R/B/A. A rigid 30 degree endoscope was utilized to evaluate the sinonasal cavities, mucosa, sinus ostia and turbinates and septum. Overall, signs of mucosal inflammation are noted. No septal hematoma appreciated. Also noted are post-surgical changes with some persistent crusting and clot within bilateral middle meati and sphenoethmoid recess. These were debrided with a 8 Fr suction straight and curved.. After debridement, sinus cavity patency was much improved. No adverse synechiae are noted currently and middle turbinates are medialized.  Right Middle meatus: more patent after debridement Right SE Recess: more patent after debridement Left MM: more patent after debridement Left SE Recess: more patent after debridement  CPT CODE -- 96045 - Mod 79, 50      Impression & Plans:  Zakariah Waltman is a 73 y.o. male with:  1. Other chronic sinusitis   2. Nasal congestion   3. Hypertrophy of both inferior nasal turbinates   4. Nasal septal  deviation   5. S/P FESS (functional endoscopic sinus surgery)    S/p septoplasty and bilateral FESS. Sinus cavities debrided today. He reports that his nasal obstruction is much improved. Sinuses do appear much more patent today  - Continue dymista  BID - Can start rinses - f/u in 2 weeks - did have a fair amount of crusting burden so will ensure that has resolve - gentle nose blowing ok in 1 week - Call in case of any other concerns  See below regarding exact medications prescribed this encounter including dosages and route: No orders of the defined types were placed in this encounter.   Thank you for allowing me the opportunity to care for your patient. Please do not hesitate to contact me should you have any other questions.  Sincerely, Milon Aloe, MD Otolaryngologist (ENT), [facility] Phone: [phone] Fax: [phone]  05/30/2023, 9:24 AM   MDM:  [phone]

## 2023-06-14 ENCOUNTER — Ambulatory Visit: Admitting: Family Medicine

## 2023-06-14 ENCOUNTER — Ambulatory Visit (INDEPENDENT_AMBULATORY_CARE_PROVIDER_SITE_OTHER)
Admission: RE | Admit: 2023-06-14 | Discharge: 2023-06-14 | Disposition: A | Discharge status: Home / Self Care | Source: Ambulatory Visit | Attending: Family Medicine | Admitting: Family Medicine

## 2023-06-14 ENCOUNTER — Encounter: Payer: Self-pay | Admitting: Family Medicine

## 2023-06-14 VITALS — BP 132/68 | HR 85 | Temp 97.8°F | Ht 75.0 in | Wt 250.4 lb

## 2023-06-14 DIAGNOSIS — R0789 Other chest pain: Secondary | ICD-10-CM | POA: Diagnosis not present

## 2023-06-14 DIAGNOSIS — W19XXXA Unspecified fall, initial encounter: Secondary | ICD-10-CM

## 2023-06-14 DIAGNOSIS — S80219A Abrasion, unspecified knee, initial encounter: Secondary | ICD-10-CM | POA: Diagnosis not present

## 2023-06-14 DIAGNOSIS — R0781 Pleurodynia: Secondary | ICD-10-CM | POA: Diagnosis not present

## 2023-06-14 DIAGNOSIS — S20212A Contusion of left front wall of thorax, initial encounter: Secondary | ICD-10-CM | POA: Diagnosis not present

## 2023-06-14 NOTE — Patient Instructions (Signed)
 Sorry about the fall yesterday.  I suspect you just have a chest wall contusion but x-ray was ordered to rule out a rib fracture.  Again this would be unlikely.  Abrasions on the knee should heal with a little bit of time, see information below.  If any new areas of discomfort let me know.  Tylenol  is fine as needed.  Hang in there.   [facility] or xray: Walk in 8:30-4:30 during weekdays, no appointment needed [address]  [place], [place] [address]  Abrasion An abrasion is a cut or scrape that affects only the surface of the skin. The injury doesn't go through all the layers of the skin. Still, an abrasion can cause pain because it leaves the skin and its nerves open. Abrasions are usually minor injuries that can be treated at home. You must care for your abrasion to prevent infection. What are the causes? Abrasions happen when something rubs, scrapes, or scratches your skin. This can happen when you fall on a hard or rough surface. Or, when a bush in your yard scratches you. When your skin rubs against something, some layers of skin may come off. You may also see tiny tears on your skin. What are the signs or symptoms? The main symptom of this condition is a cut or a scrape. The cut or scrape may bleed. It may also appear red or pink. If your abrasion is caused by a fall, there may be a bruise under your cut or scrape. How is this diagnosed? An abrasion is diagnosed with a physical exam. How is this treated? Treatment for this condition depends on how large and deep the abrasion is. In most cases: Your abrasion will be cleaned with water and mild soap. An antibiotic may be put on the wound to prevent infection. A petroleum jelly may be put on the wound to prevent moisture. A bandage may be placed on your abrasion to keep it clean. You may need a tetanus shot. Follow these instructions at home: Medicines Take over-the-counter and prescription medicines only as told by your health care  provider. If you were prescribed antibiotics, use them as told by your provider. Do not stop using them even if you start to feel better. Wound care Clean your wound 1-2 times a day, or as told by your provider. Wash your hands for at least 20 seconds with mild soap and water. Do this before and after you clean your wound. If soap and water are not available, use hand sanitizer to clean your hands. Wash your wound using mild soap and water, a wound cleanser, or a saltwater solution. A saltwater solution is also called saline. Do not use hydrogen peroxide or alcohol. These can slow healing. Rinse off the soap. Pat your wound dry with a clean towel. Do not rub your wound. Put an antibiotic ointment on your wound as told by your provider. You may also be told to put on a jelly that prevents moisture from entering the wound. Cover your wound with a bandage as told by your provider. Small or very minor abrasions may not need a bandage. Keep your bandage clean and dry as told by your provider. There are many ways to close and cover a wound. Follow instructions from your provider about changing and removing your bandage. You may have to change your bandage one or more times a day, or as told by your provider. Check your wound every day for signs of infection. Check for: Redness.  Watch for a red streak that spreads out from your wound. Swelling or worse pain. Warmth. Blood, fluid, pus, or a bad smell. Managing pain and swelling  If told, put ice on the injured area. Put ice in a plastic bag. Place a towel between your skin and the bag. Leave the ice on for 20 minutes, 2-3 times a day. If your skin turns bright red, remove the ice right away to prevent skin damage. The risk of damage is higher if you cannot feel pain, heat, or cold. If possible, raise the injured area above the level of your heart while you are sitting or lying down. General instructions Do not take baths, swim, or use a hot tub  until your provider approves. Ask your provider if you may take showers. You may only be allowed to take sponge baths. Do not scratch or pick at scabs that may occur over the wound as it heals. Contact a health care provider if: You got a tetanus shot, and you have swelling, severe pain, redness, or bleeding at the site of your shot. Your pain is worse and medicines do not help. You have a fever. You have any of signs of infection. Get help right away if: You have a red streak spreading away from your wound. This information is not intended to replace advice given to you by your health care provider. Make sure you discuss any questions you have with your health care provider. Document Revised: 03/25/2022 Document Reviewed: 03/25/2022 Elsevier Patient Education  2024 Elsevier Inc.   Chest Wall Pain Chest wall pain is pain in or around the bones and muscles of your chest. Sometimes, an injury causes this pain. Excessive coughing or overuse of arm and chest muscles may also cause chest wall pain. Sometimes, the cause may not be known. This pain may take several weeks or longer to get better. Follow these instructions at home: Managing pain, stiffness, and swelling  If directed, put ice on the painful area: Put ice in a plastic bag. Place a towel between your skin and the bag. Leave the ice on for 20 minutes, 2-3 times per day. Activity Rest as told by your health care provider. Avoid activities that cause pain. These include any activities that use your chest muscles or your abdominal and side muscles to lift heavy items. Ask your health care provider what activities are safe for you. General instructions  Take over-the-counter and prescription medicines only as told by your health care provider. Do not use any products that contain nicotine or tobacco, such as cigarettes, e-cigarettes, and chewing tobacco. These can delay healing after injury. If you need help quitting, ask your health  care provider. Keep all follow-up visits as told by your health care provider. This is important. Contact a health care provider if: You have a fever. Your chest pain becomes worse. You have new symptoms. Get help right away if: You have nausea or vomiting. You feel sweaty or light-headed. You have a cough with mucus from your lungs (sputum) or you cough up blood. You develop shortness of breath. These symptoms may represent a serious problem that is an emergency. Do not wait to see if the symptoms will go away. Get medical help right away. Call your local emergency services (911 in the [place] Do not drive yourself to the hospital. Summary Chest wall pain is pain in or around the bones and muscles of your chest. Depending on the cause, it may be treated with ice, rest, medicines,  and avoiding activities that cause pain. Contact a health care provider if you have a fever, worsening chest pain, or new symptoms. Get help right away if you feel light-headed or you develop shortness of breath. These symptoms may be an emergency. This information is not intended to replace advice given to you by your health care provider. Make sure you discuss any questions you have with your health care provider. Document Revised: 01/10/2022 Document Reviewed: 01/10/2022 Elsevier Patient Education  2024 [employer].

## 2023-06-14 NOTE — Progress Notes (Signed)
 Subjective:  Patient ID: Joshua Levine, male    DOB: 1950-03-03  Age: 73 y.o. MRN: [id number]  CC:  Chief Complaint  Patient presents with   Fall    Pt notes had a fall yesterday on the concrete and is now having some pain in his chest/sternum area. Wanted to be checked out due to pain when sneezing, knees are skinned but okay     HPI Joshua Levine presents for   Chest wall pain Unfortunately had a fall yesterday onto concrete. Leaving work, rushing in rain. Slipped and fell forward - striking knees, back of left hand and onto left chest wall. No LOC.   Feels some discomfort in the left chest wall - behind where glasses and pen were located. Did not break glasses. some pain with cough or sneeze.  Denies dyspnea, near syncope, or preceding chest pain, palpitations or focal weakness prior to fall. No prior rib fracture.  Tx: tylenol  and celebrex for arthritis has helped.  No pain in hand/wrist.   Knee abrasions Sore on skin only. No bone pain, no pain with weightbearing or ambulation (nothing more than usual preceding chronic knee pain).    History Patient Active Problem List   Diagnosis Date Noted   Nasal septal deviation 05/08/2023   Hypertrophy of both inferior nasal turbinates 05/08/2023   Family history of factor V Leiden mutation 11/06/2019   Essential hypertension 12/10/2011   Hyperlipidemia 12/10/2011   Erectile dysfunction 12/10/2011   Past Medical History:  Diagnosis Date   Allergy    Arthritis    Basal cell carcinoma    Erectile dysfunction    Hyperlipidemia    Hypertension    Past Surgical History:  Procedure Laterality Date   cataract Bilateral    ETHMOIDECTOMY Bilateral 05/08/2023   Procedure: ETHMOIDECTOMY;  Surgeon: Evelina Hippo, MD;  Location: [facility];  Service: ENT;  Laterality: Bilateral;   FRONTAL SINUS EXPLORATION Left 05/08/2023   Procedure: FRONTAL SINUS EXPLORATION;  Surgeon: Evelina Hippo, MD;  Location: [facility];  Service: ENT;  Laterality: Left;   KNEE SURGERY     LUMBAR DISC SURGERY     MAXILLARY ANTROSTOMY Bilateral 05/08/2023   Procedure: MAXILLARY ANTROSTOMY;  Surgeon: Evelina Hippo, MD;  Location: [facility];  Service: ENT;  Laterality: Bilateral;   NASAL SEPTOPLASTY W/ TURBINOPLASTY Bilateral 05/08/2023   Procedure: NASAL SEPTOPLASTY WITH INFERIOR TURBINATE REDUCTION;  Surgeon: Evelina Hippo, MD;  Location: [facility];  Service: ENT;  Laterality: Bilateral;   SINUS ENDO W/FUSION Bilateral 05/08/2023   Procedure: ENDOSCOPIC SINUS SURGERY WITH STEALTH NAVIGATION;  Surgeon: Evelina Hippo, MD;  Location: [facility];  Service: ENT;  Laterality: Bilateral;   SPINE SURGERY     No Known Allergies Prior to Admission medications   Medication Sig Start Date End Date Taking? Authorizing Provider  acetaminophen  (TYLENOL ) 500 MG tablet Take 2 tablets (1,000 mg total) by mouth every 6 (six) hours as needed. 05/08/23  Yes Milon Aloe B, MD  albuterol  (VENTOLIN  HFA) 108 (90 Base) MCG/ACT inhaler INHALE 1-2 PUFFS BY MOUTH EVERY 6 HOURS AS NEEDED FOR WHEEZE OR SHORTNESS OF BREATH 03/07/23  Yes Benjiman Bras, MD  Ascorbic Acid (VITAMIN C) 100 MG tablet Take 100 mg by mouth daily.    Yes [provider]  Azelastine -Fluticasone  137-50 MCG/ACT SUSP PLACE 1 SPRAY INTO THE NOSE EVERY 12 (TWELVE) HOURS. 04/24/23  Yes Benjiman Bras, MD  celecoxib (  CELEBREX) 50 MG capsule Take 50 mg by mouth 2 (two) times daily. Pt does not know strength   Yes [provider]  doxycycline (ADOXA) 75 MG tablet Take 75 mg by mouth 2 (two) times daily.   Yes [provider]  fluticasone -salmeterol (ADVAIR  HFA) 115-21 MCG/ACT inhaler INHALE 2 PUFFS INTO THE LUNGS TWICE A DAY 01/18/23  Yes Benjiman Bras, MD  gatifloxacin (ZYMAXID) 0.5 % SOLN Place 1 drop into the left eye 4 (four) times daily. 04/26/22  Yes [provider]  glucosamine-chondroitin  500-400 MG tablet Take 1 tablet by mouth 3 (three) times daily.   Yes [provider]  ipratropium (ATROVENT ) 0.06 % nasal spray Place 1-2 sprays into both nostrils 4 (four) times daily. As needed for nasal congestion 12/31/21  Yes Benjiman Bras, MD  lisinopril -hydrochlorothiazide  (ZESTORETIC ) 20-25 MG tablet Take 1 tablet by mouth daily. 01/16/23  Yes Benjiman Bras, MD  loratadine  (CLARITIN ) 10 MG tablet TAKE 1 TABLET BY MOUTH EVERYDAY AT BEDTIME 01/31/23  Yes Benjiman Bras, MD  pravastatin  (PRAVACHOL ) 80 MG tablet Take 1 tablet (80 mg total) by mouth daily. 01/16/23  Yes Benjiman Bras, MD  predniSONE  (DELTASONE ) 2.5 MG tablet Take 2.5 mg by mouth daily with breakfast.   Yes [provider]  sodium chloride  (OCEAN) 0.65 % SOLN nasal spray Place 2 sprays into both nostrils as needed for congestion (after any blood tinged drainage stops). 05/08/23  Yes Milon Aloe B, MD  triamcinolone  ointment (KENALOG ) 0.1 % Apply 1 Application topically 2 (two) times daily. 04/18/23 04/17/24 Yes Jess Morita, MD   Social History   Socioeconomic History   Marital status: Married    Spouse name: Not on file   Number of children: Not on file   Years of education: Not on file   Highest education level: Associate degree: occupational, Scientist, product/process development, or vocational program  Occupational History   Occupation: Radiation protection practitioner: [employer]  Tobacco Use   Smoking status: Former   Smokeless tobacco: Never  Substance and Sexual Activity   Alcohol use: No   Drug use: No   Sexual activity: Yes  Other Topics Concern   Not on file  Social History Narrative   Married. Education: [employer]. Exercise: Walk 2 times a week for 45 minutes.   Social Drivers of Corporate investment banker Strain: Low Risk  (01/15/2023)   Overall Financial Resource Strain (CARDIA)    Difficulty of Paying Living Expenses: Not hard at all  Food Insecurity: No Food Insecurity (01/15/2023)   Hunger  Vital Sign    Worried About Running Out of Food in the Last Year: Never true    Ran Out of Food in the Last Year: Never true  Transportation Needs: No Transportation Needs (01/15/2023)   PRAPARE - Administrator, Civil Service (Medical): No    Lack of Transportation (Non-Medical): No  Physical Activity: Insufficiently Active (01/15/2023)   Exercise Vital Sign    Days of Exercise per Week: 1 day    Minutes of Exercise per Session: 10 min  Stress: No Stress Concern Present (01/15/2023)   [employer] of Occupational Health - Occupational Stress Questionnaire    Feeling of Stress : Only a little  Social Connections: Moderately Isolated (01/15/2023)   Social Connection and Isolation Panel [NHANES]    Frequency of Communication with Friends and Family: More than three times a week    Frequency of Social Gatherings with Friends and  Family: Once a week    Attends Religious Services: Never    Active Member of Clubs or Organizations: No    Attends Engineer, structural: Not on file    Marital Status: Married  Intimate Partner Violence: Unknown (08/31/2021)   Received from [employer], [facility]   HITS    Physically Hurt: Not on file    Insult or Talk Down To: Not on file    Threaten Physical Harm: Not on file    Scream or Curse: Not on file    Review of Systems Per HPI.   Objective:   Vitals:   06/14/23 0811  BP: 132/68  Pulse: 85  Temp: 97.8 F (36.6 C)  TempSrc: Temporal  SpO2: 96%  Weight: 250 lb 6.4 oz (113.6 kg)  Height: 6\' 3"  (1.905 m)     Physical Exam Vitals reviewed.  Constitutional:      General: He is not in acute distress.    Appearance: Normal appearance. He is well-developed.  HENT:     Head: Normocephalic and atraumatic.  Cardiovascular:     Rate and Rhythm: Normal rate.  Pulmonary:     Effort: Pulmonary effort is normal. No respiratory distress.     Breath sounds: Normal breath sounds. No wheezing.     Comments: Normal  respiratory effort with equal breath sounds right and left side. No subcu emphysema No ecchymosis seen on chest wall, skin intact.  Slight discomfort over the left anterior chest wall, lateral to sternum.  No [place] tenderness or swelling, sternum nontender, clavicle, AC nontender.  Intact strength with pectoralis testing. Skin:    Comments: Few small abrasions on anterior knees, left dorsal wrist but no bony tenderness over left wrist with pain-free range of motion of hand and wrist.  Also no bony tenderness on left or right knee with pain-free range of motion.  Patellas nontender.  Neurological:     Mental Status: He is alert and oriented to person, place, and time.  Psychiatric:        Mood and Affect: Mood normal.         Assessment & Plan:  Joshua Levine is a 73 y.o. male . Fall, initial encounter - Plan: DG Ribs Unilateral W/Chest Left  Left-sided chest wall pain - Plan: DG Ribs Unilateral W/Chest Left  Contusion of left chest wall, initial encounter - Plan: DG Ribs Unilateral W/Chest Left  Abrasion of knee, unspecified laterality, initial encounter  Mechanical fall, accidental fall as above after slipping on wet ground.  No preceding symptoms.  Abrasions on the knee without bony tenderness, full motion.  Also reassuring exam of left wrist/hand, defer imaging of these areas, symptomatic care discussed.  Primary area of concern is left chest wall with likely contusion but no ecchymosis seen on skin.  Will check x-ray for possible rib fracture but unlikely.  Discussed deep breaths throughout the day, symptomatic care with Tylenol  if needed with RTC precautions/ER precautions given.  No orders of the defined types were placed in this encounter.  Patient Instructions  Sorry about the fall yesterday.  I suspect you just have a chest wall contusion but x-ray was ordered to rule out a rib fracture.  Again this would be unlikely.  Abrasions on the knee should heal with a little bit of time,  see information below.  If any new areas of discomfort let me know.  Tylenol  is fine as needed.  Hang in there.   [facility] or xray: Walk  in 8:30-4:30 during weekdays, no appointment needed [address]  [place], [place] [address]  Abrasion An abrasion is a cut or scrape that affects only the surface of the skin. The injury doesn't go through all the layers of the skin. Still, an abrasion can cause pain because it leaves the skin and its nerves open. Abrasions are usually minor injuries that can be treated at home. You must care for your abrasion to prevent infection. What are the causes? Abrasions happen when something rubs, scrapes, or scratches your skin. This can happen when you fall on a hard or rough surface. Or, when a bush in your yard scratches you. When your skin rubs against something, some layers of skin may come off. You may also see tiny tears on your skin. What are the signs or symptoms? The main symptom of this condition is a cut or a scrape. The cut or scrape may bleed. It may also appear red or pink. If your abrasion is caused by a fall, there may be a bruise under your cut or scrape. How is this diagnosed? An abrasion is diagnosed with a physical exam. How is this treated? Treatment for this condition depends on how large and deep the abrasion is. In most cases: Your abrasion will be cleaned with water and mild soap. An antibiotic may be put on the wound to prevent infection. A petroleum jelly may be put on the wound to prevent moisture. A bandage may be placed on your abrasion to keep it clean. You may need a tetanus shot. Follow these instructions at home: Medicines Take over-the-counter and prescription medicines only as told by your health care provider. If you were prescribed antibiotics, use them as told by your provider. Do not stop using them even if you start to feel better. Wound care Clean your wound 1-2 times a day, or as told by your provider. Wash  your hands for at least 20 seconds with mild soap and water. Do this before and after you clean your wound. If soap and water are not available, use hand sanitizer to clean your hands. Wash your wound using mild soap and water, a wound cleanser, or a saltwater solution. A saltwater solution is also called saline. Do not use hydrogen peroxide or alcohol. These can slow healing. Rinse off the soap. Pat your wound dry with a clean towel. Do not rub your wound. Put an antibiotic ointment on your wound as told by your provider. You may also be told to put on a jelly that prevents moisture from entering the wound. Cover your wound with a bandage as told by your provider. Small or very minor abrasions may not need a bandage. Keep your bandage clean and dry as told by your provider. There are many ways to close and cover a wound. Follow instructions from your provider about changing and removing your bandage. You may have to change your bandage one or more times a day, or as told by your provider. Check your wound every day for signs of infection. Check for: Redness. Watch for a red streak that spreads out from your wound. Swelling or worse pain. Warmth. Blood, fluid, pus, or a bad smell. Managing pain and swelling  If told, put ice on the injured area. Put ice in a plastic bag. Place a towel between your skin and the bag. Leave the ice on for 20 minutes, 2-3 times a day. If your skin turns bright red, remove the ice right away to prevent  skin damage. The risk of damage is higher if you cannot feel pain, heat, or cold. If possible, raise the injured area above the level of your heart while you are sitting or lying down. General instructions Do not take baths, swim, or use a hot tub until your provider approves. Ask your provider if you may take showers. You may only be allowed to take sponge baths. Do not scratch or pick at scabs that may occur over the wound as it heals. Contact a health care  provider if: You got a tetanus shot, and you have swelling, severe pain, redness, or bleeding at the site of your shot. Your pain is worse and medicines do not help. You have a fever. You have any of signs of infection. Get help right away if: You have a red streak spreading away from your wound. This information is not intended to replace advice given to you by your health care provider. Make sure you discuss any questions you have with your health care provider. Document Revised: 03/25/2022 Document Reviewed: 03/25/2022 Elsevier Patient Education  2024 Elsevier Inc.   Chest Wall Pain Chest wall pain is pain in or around the bones and muscles of your chest. Sometimes, an injury causes this pain. Excessive coughing or overuse of arm and chest muscles may also cause chest wall pain. Sometimes, the cause may not be known. This pain may take several weeks or longer to get better. Follow these instructions at home: Managing pain, stiffness, and swelling  If directed, put ice on the painful area: Put ice in a plastic bag. Place a towel between your skin and the bag. Leave the ice on for 20 minutes, 2-3 times per day. Activity Rest as told by your health care provider. Avoid activities that cause pain. These include any activities that use your chest muscles or your abdominal and side muscles to lift heavy items. Ask your health care provider what activities are safe for you. General instructions  Take over-the-counter and prescription medicines only as told by your health care provider. Do not use any products that contain nicotine or tobacco, such as cigarettes, e-cigarettes, and chewing tobacco. These can delay healing after injury. If you need help quitting, ask your health care provider. Keep all follow-up visits as told by your health care provider. This is important. Contact a health care provider if: You have a fever. Your chest pain becomes worse. You have new symptoms. Get help  right away if: You have nausea or vomiting. You feel sweaty or light-headed. You have a cough with mucus from your lungs (sputum) or you cough up blood. You develop shortness of breath. These symptoms may represent a serious problem that is an emergency. Do not wait to see if the symptoms will go away. Get medical help right away. Call your local emergency services (911 in the [place] Do not drive yourself to the hospital. Summary Chest wall pain is pain in or around the bones and muscles of your chest. Depending on the cause, it may be treated with ice, rest, medicines, and avoiding activities that cause pain. Contact a health care provider if you have a fever, worsening chest pain, or new symptoms. Get help right away if you feel light-headed or you develop shortness of breath. These symptoms may be an emergency. This information is not intended to replace advice given to you by your health care provider. Make sure you discuss any questions you have with your health care provider. Document Revised: 01/10/2022 Document  Reviewed: 01/10/2022 Elsevier Patient Education  2024 Elsevier Inc.    Signed,   Caro Christmas, MD [facility], [facility] 06/14/23 8:57 AM

## 2023-06-15 ENCOUNTER — Ambulatory Visit: Payer: Self-pay | Admitting: Family Medicine

## 2023-06-20 ENCOUNTER — Encounter (INDEPENDENT_AMBULATORY_CARE_PROVIDER_SITE_OTHER): Payer: Self-pay | Admitting: Otolaryngology

## 2023-06-20 ENCOUNTER — Ambulatory Visit (INDEPENDENT_AMBULATORY_CARE_PROVIDER_SITE_OTHER): Admitting: Otolaryngology

## 2023-06-20 VITALS — BP 145/76 | HR 100 | Ht 75.0 in | Wt 240.0 lb

## 2023-06-20 DIAGNOSIS — J343 Hypertrophy of nasal turbinates: Secondary | ICD-10-CM

## 2023-06-20 DIAGNOSIS — J328 Other chronic sinusitis: Secondary | ICD-10-CM

## 2023-06-20 DIAGNOSIS — Z9889 Other specified postprocedural states: Secondary | ICD-10-CM

## 2023-06-20 DIAGNOSIS — R0981 Nasal congestion: Secondary | ICD-10-CM

## 2023-06-20 DIAGNOSIS — Z87891 Personal history of nicotine dependence: Secondary | ICD-10-CM

## 2023-06-20 DIAGNOSIS — J342 Deviated nasal septum: Secondary | ICD-10-CM

## 2023-06-20 NOTE — Progress Notes (Signed)
 Dear Dr. Ester Helms, Here is my assessment for our mutual patient, Joshua Levine. Thank you for allowing me the opportunity to care for your patient. Please do not hesitate to contact me should you have any other questions. Sincerely, Dr. Milon Aloe  Otolaryngology Clinic Note Referring provider: Dr. Ester Helms HPI:  Joshua Levine is a 73 y.o. male kindly referred by Dr. Ester Helms for evaluation of chronic sinusitis.  Initial visit (02/2023): He reports chronic sinus trouble "all my life." Patient reports that he started to have particularly bad issues with his sinuses this summer with left > right sided maxillary sinus tenderness, and some ethmoid pressure. He reports he also then had discolored drainage, and with bad taste and odor. Lasted for 2 months. He had a dental eval during this time, and reported that his teeth were doing well. He reports that he does not have pain currently and not tasting drainage. He has been prescribed multiple courses of antibiotics - levaquin , augmentin , and 3 more courses (mostly augmentin ). The antibiotics seemed to help, but then came back. He reports that he is doing better now, but back at his baseline (which is as noted below)  He reports that before this, he has chronic issues with his sinuses. Reports bilateral post nasal drip, some anterior rhinorrhea (mucus), and no facial pressure/pain. Breathes fairly well through the nose, occassional congestion. Some left obstruction. Sense of smell is decent.  He gets about 2-3 infections/year for "decades" - discolored mucus, facial pain -- mostly on left but also intermittent on right.  He currently saline spray, has used astelin , flonase , and atrovent  in the past, currently using. Using dymista  currently and claritin . Has used sinus rinses intermittently  - did not help much.  Allergy testing - last time 5 years ago -- allergic to dust, molds, dogs, grasses,  trees. --------------------------------------------------------- 05/12/2023: S/p b/l FESS, ITR and septoplasty on 05/08/2023. He reports he has been doing ok after surgery - expected blood tinged drainage and nasal obstruction. No significant headache or facial pain. Splints removed today.  --------------------------------------------------------- 05/30/2023 Seen again in follow up after FESS. No epistaxis, does report some nasal congestion. No significant headache or facial pain. He does report that overall he is doing fairly well. -------------------------------------------------------- 06/20/2023 Returns for follow up. No epistaxis, nasal breathing improved. No significant facial pain. He is using dymista  and irrigations.  H&N Surgery: see above Personal or FHx of bleeding dz or anesthesia difficulty: no - but family history of Factor V Leiden Mutation  GLP-1: no AP/AC: no  PMHx: HTN, HLD, Arthritis on Celebrex  Tobacco: former. 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 Lives in [place], [place]  Independent Review of Additional Tests or Records:  Dr. Ester Helms (01/16/2023 and 12/16/2022): Dx: Sinusitis in 12/2022; Rx: Augmentin ; Dec 2024: subacute sinusitis, improved after with abx but worsening drainage, discolored drainage, not clear; Rx: levaquin , Ref ENT, CT Sinus Dr. Ester Helms (FM) (07/25/2022 and 09/02/2022): 07/25/2022 - Sinusitis; Rx: Augmentin ; Aug 2024, sinus pain, right face, nothing on dental eval; congestion, drainage, no discolor; Atrovent  has helped; intermittent using afrin; astelin /flonsae using consistently; Rx: symptomatic mgmt CMP 01/16/2023: BUN/Cr: 25/1.01 CBC 11/30/2020: WBC 12.1, Hgb 16, Plt 294; Eos 200  CT Face (01/31/2023): left max completely opacified, b/l frontal recess MPT, b/l ethmoid mild MPT; left septal deviation  Path 05/08/2023: "findings consistent with chronic sinusitis"   PMH/Meds/All/SocHx/FamHx/ROS:   Past Medical History:  Diagnosis Date    Allergy    Arthritis    Basal cell carcinoma  Erectile dysfunction    Hyperlipidemia    Hypertension      Past Surgical History:  Procedure Laterality Date   cataract Bilateral    ETHMOIDECTOMY Bilateral 05/08/2023   Procedure: ETHMOIDECTOMY;  Surgeon: Evelina Hippo, MD;  Location: [facility];  Service: ENT;  Laterality: Bilateral;   FRONTAL SINUS EXPLORATION Left 05/08/2023   Procedure: FRONTAL SINUS EXPLORATION;  Surgeon: Evelina Hippo, MD;  Location: [facility];  Service: ENT;  Laterality: Left;   KNEE SURGERY     LUMBAR DISC SURGERY     MAXILLARY ANTROSTOMY Bilateral 05/08/2023   Procedure: MAXILLARY ANTROSTOMY;  Surgeon: Evelina Hippo, MD;  Location: [facility];  Service: ENT;  Laterality: Bilateral;   NASAL SEPTOPLASTY W/ TURBINOPLASTY Bilateral 05/08/2023   Procedure: NASAL SEPTOPLASTY WITH INFERIOR TURBINATE REDUCTION;  Surgeon: Evelina Hippo, MD;  Location: [facility];  Service: ENT;  Laterality: Bilateral;   SINUS ENDO W/FUSION Bilateral 05/08/2023   Procedure: ENDOSCOPIC SINUS SURGERY WITH STEALTH NAVIGATION;  Surgeon: Evelina Hippo, MD;  Location: [facility];  Service: ENT;  Laterality: Bilateral;   SPINE SURGERY      Family History  Problem Relation Age of Onset   Heart disease Mother    Parkinson's disease Father    Alzheimer's disease Father    Factor V Leiden deficiency Brother      Social Connections: Moderately Isolated (01/15/2023)   Social Connection and Isolation Panel [NHANES]    Frequency of Communication with Friends and Family: More than three times a week    Frequency of Social Gatherings with Friends and Family: Once a week    Attends Religious Services: Never    Database administrator or Organizations: No    Attends Engineer, structural: Not on file    Marital Status: Married      Current Outpatient Medications:    acetaminophen  (TYLENOL ) 500 MG tablet, Take  2 tablets (1,000 mg total) by mouth every 6 (six) hours as needed., Disp: 30 tablet, Rfl: 0   albuterol  (VENTOLIN  HFA) 108 (90 Base) MCG/ACT inhaler, INHALE 1-2 PUFFS BY MOUTH EVERY 6 HOURS AS NEEDED FOR WHEEZE OR SHORTNESS OF BREATH, Disp: 18 each, Rfl: 1   Ascorbic Acid (VITAMIN C) 100 MG tablet, Take 100 mg by mouth daily. , Disp: , Rfl:    Azelastine -Fluticasone  137-50 MCG/ACT SUSP, PLACE 1 SPRAY INTO THE NOSE EVERY 12 (TWELVE) HOURS., Disp: 23 g, Rfl: 2   celecoxib (CELEBREX) 50 MG capsule, Take 50 mg by mouth 2 (two) times daily. Pt does not know strength, Disp: , Rfl:    doxycycline (ADOXA) 75 MG tablet, Take 75 mg by mouth 2 (two) times daily., Disp: , Rfl:    fluticasone -salmeterol (ADVAIR  HFA) 115-21 MCG/ACT inhaler, INHALE 2 PUFFS INTO THE LUNGS TWICE A DAY, Disp: 12 each, Rfl: 12   gatifloxacin (ZYMAXID) 0.5 % SOLN, Place 1 drop into the left eye 4 (four) times daily., Disp: , Rfl:    glucosamine-chondroitin 500-400 MG tablet, Take 1 tablet by mouth 3 (three) times daily., Disp: , Rfl:    ipratropium (ATROVENT ) 0.06 % nasal spray, Place 1-2 sprays into both nostrils 4 (four) times daily. As needed for nasal congestion, Disp: 15 mL, Rfl: 5   lisinopril -hydrochlorothiazide  (ZESTORETIC ) 20-25 MG tablet, Take 1 tablet by mouth daily., Disp: 90 tablet, Rfl: 1   loratadine  (CLARITIN ) 10 MG tablet, TAKE 1 TABLET BY MOUTH EVERYDAY AT BEDTIME, Disp: 90 tablet,  Rfl: 0   pravastatin  (PRAVACHOL ) 80 MG tablet, Take 1 tablet (80 mg total) by mouth daily., Disp: 90 tablet, Rfl: 1   predniSONE  (DELTASONE ) 2.5 MG tablet, Take 2.5 mg by mouth daily with breakfast., Disp: , Rfl:    sodium chloride  (OCEAN) 0.65 % SOLN nasal spray, Place 2 sprays into both nostrils as needed for congestion (after any blood tinged drainage stops)., Disp: 30 mL, Rfl: 0   triamcinolone  ointment (KENALOG ) 0.1 %, Apply 1 Application topically 2 (two) times daily., Disp: 90 g, Rfl: 1   Physical Exam:   BP (!) 145/76 (BP Location:  Left Arm, Patient Position: Sitting, Cuff Size: Normal)   Pulse 100   Ht 6\' 3"  (1.905 m)   Wt 240 lb (108.9 kg)   SpO2 91%   BMI 30.00 kg/m   Salient findings:  CN II-XII intact Anterior rhinoscopy: no evidence of septal hematoma; bilateral turbinates reduced; fair amount of global crusting in b/l nasal cavities. Nasal endoscopy was indicated to better evaluate the nose and paranasal sinuses, given the patient's history and exam findings, and is detailed below. No respiratory distress or stridor  Seprately Identifiable Procedures:  PROCEDURE: Bilateral Diagnostic Rigid Nasal Endoscopy with Debridement Pre-procedure diagnosis: Post-operative examination and care after bilateral Functional Endoscopic Sinus Surgery - of note: this procedure was NOT performed for management of the septoplasty or the turbinate reduction. Post-procedure diagnosis: same Indication: See pre-procedure diagnosis and physical exam above Complications: None apparent EBL: <5 mL Anesthesia: Lidocaine  4% and topical decongestant was topically sprayed in each nasal cavity  Description of Procedure:  Patient was identified and verbal consent obtained after discussion of R/B/A. A rigid 30 degree endoscope was utilized to evaluate the sinonasal cavities, mucosa, sinus ostia and turbinates and septum. Overall, signs of mucosal inflammation are noted. Also noted are post-surgical changes with some persistent crusting within bilateral middle meati and sphenoethmoid recess (see below) These were debrided with a 8 Fr suction straight and curved.. After debridement, sinus cavity patency was much improved. No adverse synechiae are noted currently and middle turbinates are medialized.  Right Middle meatus: more patent after debridement Right SE Recess: more patent after debridement Left MM: more patent after debridement Left SE Recess: more patent after debridement    CPT CODE -- 60454 - Mod 79, 50      Impression & Plans:   Lyndell Allaire is a 73 y.o. male with:  1. Other chronic sinusitis   2. Nasal congestion   3. Hypertrophy of both inferior nasal turbinates   4. S/P FESS (functional endoscopic sinus surgery)   5. Nasal septal deviation    S/p septoplasty and bilateral FESS. Sinus cavities debrided today. He reports that his nasal obstruction is much improved. Sinuses do appear healing appropriately minus crusting.  - Continue dymista  BID - Increase rinses to BID - f/u in 8 weeks - Call in case of any other concerns  See below regarding exact medications prescribed this encounter including dosages and route: No orders of the defined types were placed in this encounter.   Thank you for allowing me the opportunity to care for your patient. Please do not hesitate to contact me should you have any other questions.  Sincerely, Milon Aloe, MD Otolaryngologist (ENT), [facility] Phone: [phone] Fax: [phone]  06/20/2023, 4:23 PM   MDM:  [phone]

## 2023-07-20 ENCOUNTER — Other Ambulatory Visit: Payer: Self-pay | Admitting: Family Medicine

## 2023-07-20 ENCOUNTER — Encounter: Payer: Self-pay | Admitting: Family Medicine

## 2023-07-20 ENCOUNTER — Ambulatory Visit: Payer: BC Managed Care – PPO | Admitting: Family Medicine

## 2023-07-20 VITALS — BP 120/80 | HR 51 | Temp 97.8°F | Ht 75.0 in | Wt 248.0 lb

## 2023-07-20 DIAGNOSIS — R7303 Prediabetes: Secondary | ICD-10-CM

## 2023-07-20 DIAGNOSIS — J4530 Mild persistent asthma, uncomplicated: Secondary | ICD-10-CM

## 2023-07-20 DIAGNOSIS — J309 Allergic rhinitis, unspecified: Secondary | ICD-10-CM

## 2023-07-20 DIAGNOSIS — Z23 Encounter for immunization: Secondary | ICD-10-CM

## 2023-07-20 DIAGNOSIS — I10 Essential (primary) hypertension: Secondary | ICD-10-CM | POA: Diagnosis not present

## 2023-07-20 DIAGNOSIS — R0683 Snoring: Secondary | ICD-10-CM

## 2023-07-20 DIAGNOSIS — Z125 Encounter for screening for malignant neoplasm of prostate: Secondary | ICD-10-CM

## 2023-07-20 DIAGNOSIS — E785 Hyperlipidemia, unspecified: Secondary | ICD-10-CM | POA: Diagnosis not present

## 2023-07-20 DIAGNOSIS — Z0000 Encounter for general adult medical examination without abnormal findings: Secondary | ICD-10-CM | POA: Diagnosis not present

## 2023-07-20 DIAGNOSIS — R0981 Nasal congestion: Secondary | ICD-10-CM

## 2023-07-20 LAB — COMPREHENSIVE METABOLIC PANEL WITH GFR
ALT: 33 U/L (ref 0–53)
AST: 27 U/L (ref 0–37)
Albumin: 4.7 g/dL (ref 3.5–5.2)
Alkaline Phosphatase: 64 U/L (ref 39–117)
BUN: 23 mg/dL (ref 6–23)
CO2: 27 meq/L (ref 19–32)
Calcium: 10.2 mg/dL (ref 8.4–10.5)
Chloride: 102 meq/L (ref 96–112)
Creatinine, Ser: 1.13 mg/dL (ref 0.40–1.50)
GFR: 65.41 mL/min (ref >60.00)
Glucose, Bld: 112 mg/dL — ABNORMAL HIGH (ref 70–99)
Potassium: 4.9 meq/L (ref 3.5–5.1)
Sodium: 138 meq/L (ref 135–145)
Total Bilirubin: 0.4 mg/dL (ref 0.2–1.2)
Total Protein: 7.3 g/dL (ref 6.0–8.3)

## 2023-07-20 LAB — PSA: PSA: 1.2 ng/mL (ref 0.10–4.00)

## 2023-07-20 LAB — LIPID PANEL
Cholesterol: 161 mg/dL (ref 0–200)
HDL: 34.8 mg/dL — ABNORMAL LOW (ref >39.00)
LDL Cholesterol: 56 mg/dL (ref 0–99)
NonHDL: 126.08
Total CHOL/HDL Ratio: 5
Triglycerides: 350 mg/dL — ABNORMAL HIGH (ref 0.0–149.0)
VLDL: 70 mg/dL — ABNORMAL HIGH (ref 0.0–40.0)

## 2023-07-20 LAB — HEMOGLOBIN A1C: Hgb A1c MFr Bld: 6.2 % (ref 4.6–6.5)

## 2023-07-20 MED ORDER — ALBUTEROL SULFATE HFA 108 (90 BASE) MCG/ACT IN AERS: 1.0000 | INHALATION_SPRAY | Freq: Four times a day (QID) | RESPIRATORY_TRACT | 1 refills | Status: AC | PRN

## 2023-07-20 MED ORDER — LORATADINE 10 MG PO TABS: ORAL_TABLET | ORAL | 3 refills | Status: AC

## 2023-07-20 MED ORDER — LISINOPRIL-HYDROCHLOROTHIAZIDE 20-25 MG PO TABS: 1.0000 | ORAL_TABLET | Freq: Every day | ORAL | 3 refills | Status: AC

## 2023-07-20 MED ORDER — FLUTICASONE-SALMETEROL 115-21 MCG/ACT IN AERO: 2.0000 | INHALATION_SPRAY | Freq: Two times a day (BID) | RESPIRATORY_TRACT | 12 refills | Status: AC

## 2023-07-20 MED ORDER — PRAVASTATIN SODIUM 80 MG PO TABS: 80.0000 mg | ORAL_TABLET | Freq: Every day | ORAL | 3 refills | Status: AC

## 2023-07-20 NOTE — Patient Instructions (Addendum)
 Thank you for coming in today. No change in medications at this time. If there are any concerns on your bloodwork, I will let you know.  Updated pneumonia vaccine given today.  Follow-up with your ENT specialist regarding the sinus congestion concerns.  I did refer you to a sleep specialist to discuss a sleep test for possible sleep apnea.  Let me know if there are questions and take care!  Preventive Care 32 Years and Older, Male Preventive care refers to lifestyle choices and visits with your health care provider that can promote health and wellness. Preventive care visits are also called wellness exams. What can I expect for my preventive care visit? Counseling During your preventive care visit, your health care provider may ask about your: Medical history, including: Past medical problems. Family medical history. History of falls. Current health, including: Emotional well-being. Home life and relationship well-being. Sexual activity. Memory and ability to understand (cognition). Lifestyle, including: Alcohol, nicotine or tobacco, and drug use. Access to firearms. Diet, exercise, and sleep habits. Work and work Astronomer. Sunscreen use. Safety issues such as seatbelt and bike helmet use. Physical exam Your health care provider will check your: Height and weight. These may be used to calculate your BMI (body mass index). BMI is a measurement that tells if you are at a healthy weight. Waist circumference. This measures the distance around your waistline. This measurement also tells if you are at a healthy weight and may help predict your risk of certain diseases, such as type 2 diabetes and high blood pressure. Heart rate and blood pressure. Body temperature. Skin for abnormal spots. What immunizations do I need?  Vaccines are usually given at various ages, according to a schedule. Your health care provider will recommend vaccines for you based on your age, medical history, and  lifestyle or other factors, such as travel or where you work. What tests do I need? Screening Your health care provider may recommend screening tests for certain conditions. This may include: Lipid and cholesterol levels. Diabetes screening. This is done by checking your blood sugar (glucose) after you have not eaten for a while (fasting). Hepatitis C test. Hepatitis B test. HIV (human immunodeficiency virus) test. STI (sexually transmitted infection) testing, if you are at risk. Lung cancer screening. Colorectal cancer screening. Prostate cancer screening. Abdominal aortic aneurysm (AAA) screening. You may need this if you are a current or former smoker. Talk with your health care provider about your test results, treatment options, and if necessary, the need for more tests. Follow these instructions at home: Eating and drinking  Eat a diet that includes fresh fruits and vegetables, whole grains, lean protein, and low-fat dairy products. Limit your intake of foods with high amounts of sugar, saturated fats, and salt. Take vitamin and mineral supplements as recommended by your health care provider. Do not drink alcohol if your health care provider tells you not to drink. If you drink alcohol: Limit how much you have to 0-2 drinks a day. Know how much alcohol is in your drink. In the [place] one drink equals one 12 oz bottle of beer (355 mL), one 5 oz glass of wine (148 mL), or one 1 oz glass of hard liquor (44 mL). Lifestyle Brush your teeth every morning and night with fluoride toothpaste. Floss one time each day. Exercise for at least 30 minutes 5 or more days each week. Do not use any products that contain nicotine or tobacco. These products include cigarettes, chewing tobacco, and vaping devices,  such as e-cigarettes. If you need help quitting, ask your health care provider. Do not use drugs. If you are sexually active, practice safe sex. Use a condom or other form of protection to  prevent STIs. Take aspirin only as told by your health care provider. Make sure that you understand how much to take and what form to take. Work with your health care provider to find out whether it is safe and beneficial for you to take aspirin daily. Ask your health care provider if you need to take a cholesterol-lowering medicine (statin). Find healthy ways to manage stress, such as: Meditation, yoga, or listening to music. Journaling. Talking to a trusted person. Spending time with friends and family. Safety Always wear your seat belt while driving or riding in a vehicle. Do not drive: If you have been drinking alcohol. Do not ride with someone who has been drinking. When you are tired or distracted. While texting. If you have been using any mind-altering substances or drugs. Wear a helmet and other protective equipment during sports activities. If you have firearms in your house, make sure you follow all gun safety procedures. Minimize exposure to UV radiation to reduce your risk of skin cancer. What's next? Visit your health care provider once a year for an annual wellness visit. Ask your health care provider how often you should have your eyes and teeth checked. Stay up to date on all vaccines. This information is not intended to replace advice given to you by your health care provider. Make sure you discuss any questions you have with your health care provider. Document Revised: 07/15/2020 Document Reviewed: 07/15/2020 Elsevier Patient Education  2024 [employer].

## 2023-07-20 NOTE — Progress Notes (Signed)
 Subjective:  Patient ID: Joshua Levine, male    DOB: 11-07-1950  Age: 73 y.o. MRN: [id number]  CC:  Chief Complaint  Patient presents with   Annual Exam    No concerns    HPI [name] presents for Annual Exam  PCP, me ENT, Dr. Lydia Sams, chronic sinusitis, nasal septoplasty in April.  Office visit May 20.  Sinus cavities debrided that day.  Nasal obstruction was improved.  Continuing on Dymista , twice daily rinses, 8-week follow-up. Plans to call for some increased drainage, odor to th drainage. No fever.  On claritin  for allergies.  Orthopedics, Dr. Marland Silvas, right knee osteoarthritis, celebrex once per day, tylenol .  Dermatology, Dr. Gideon Kussmaul, history of basal cell carcinoma.  Left rib fractures Evaluated 1 month ago after fall.  Multiple suspected rib fractures noted on imaging.  No pneumothorax, no apparent displaced fractures.  No pleural effusion.  Declined additional pain medication on follow-up phone call. Pretty much healed. No recent pain in past few weeks. Doing well.   Asthma: Taking advair  2 puffs BID. Controlled, only needing albuterol  once per month.   Hypertension: Lisinopril  HCTZ 20/25 mg daily.no new side effects.  Has noticed snoring for years. Some naps on weekends, apple watch has notified about possible apnea episodes.  Home readings: none BP Readings from Last 3 Encounters:  07/20/23 120/80  06/20/23 (!) 145/76  06/14/23 132/68   Lab Results  Component Value Date   CREATININE 1.23 05/03/2023   Hyperlipidemia: Pravastatin  80 mg daily, no new myalgias/side effects.  Lab Results  Component Value Date   CHOL 140 01/16/2023   HDL 32.70 (L) 01/16/2023   LDLCALC 55 01/16/2023   LDLDIRECT 100.0 07/08/2022   TRIG 260.0 (H) 01/16/2023   CHOLHDL 4 01/16/2023   Lab Results  Component Value Date   ALT 38 01/16/2023   AST 28 01/16/2023   ALKPHOS 62 01/16/2023   BILITOT 0.3 01/16/2023   Prediabetes: Diet/exercise approach.  Some stress eating at  times. Other coping techniques discussed. Exercise - minimal. Has started some exercises for knee. Considering elliptical  Lab Results  Component Value Date   HGBA1C 6.4 01/16/2023   Wt Readings from Last 3 Encounters:  07/20/23 248 lb (112.5 kg)  06/20/23 240 lb (108.9 kg)  06/14/23 250 lb 6.4 oz (113.6 kg)       06/14/2023    8:13 AM 01/16/2023    8:19 AM 07/08/2022    7:55 AM 12/31/2021    9:14 AM 07/01/2021    2:32 PM  Depression screen PHQ 2/9  Decreased Interest 0 0 0 0 0  Down, Depressed, Hopeless 0 0 0 0 0  PHQ - 2 Score 0 0 0 0 0  Altered sleeping 0 1 0 2 0  Tired, decreased energy 0 0 0 0 0  Change in appetite 0 0 0 0 0  Feeling bad or failure about yourself  0 0 0 0 0  Trouble concentrating 0 0 0 0 0  Moving slowly or fidgety/restless 0 0 1 0 0  Suicidal thoughts 0 0 0 0 0  PHQ-9 Score 0 1 1 2  0  Difficult doing work/chores   Not difficult at all  Not difficult at all    Health Maintenance  Topic Date Due   Colonoscopy  Never done   COVID-19 Vaccine (7 - 2024-25 season) 05/06/2023   INFLUENZA VACCINE  09/01/2023   DTaP/Tdap/Td (3 - Td or Tdap) 04/17/2033   Pneumococcal Vaccine: 50+ Years  Completed  Hepatitis C Screening  Completed   Zoster Vaccines- Shingrix  Completed   HPV VACCINES  Aged Out   Meningococcal B Vaccine  Aged Out  Colon cancer screening- declined,  Prostate: does  have family history of prostate cancer in his father The natural history of prostate cancer and ongoing controversy regarding screening and potential treatment outcomes of prostate cancer has been discussed with the patient. The meaning of a false positive PSA and a false negative PSA has been discussed. He indicates understanding of the limitations of this screening test and wishes  to proceed with screening PSA testing. Dermatology, history of basal cell carcinoma as above.  Yearly dermatology follow-up. Advised to call to reschedule.  Lab Results  Component Value Date   PSA1 0.6  09/15/2016   PSA 0.64 01/06/2022   PSA 0.67 11/30/2020   PSA 0.64 01/13/2014    Immunization History  Administered Date(s) Administered   Fluad Quad(high Dose 65+) 10/17/2018, 11/06/2019, 11/30/2020   Influenza Inj Mdck Quad Pf 02/06/2017   Influenza Split 11/19/2011, 10/27/2014, 12/02/2015   Influenza, High Dose Seasonal PF 12/13/2017   Influenza,inj,Quad PF,6+ Mos 11/12/2012, 01/13/2014   Influenza-Unspecified 11/06/2021, 11/05/2022   PFIZER(Purple Top)SARS-COV-2 Vaccination 05/04/2019, 06/01/2019, 01/17/2020, 07/04/2020, 02/06/2021   Pneumococcal Conjugate-13 09/14/2017   Pneumococcal Polysaccharide-23 10/17/2018   Tdap 11/12/2012, 04/18/2023   Unspecified SARS-COV-2 Vaccination 11/05/2022   Zoster Recombinant(Shingrix) 12/21/2020, 02/15/2021  Flu, covid booster in fall discussed.  Prevnar 20 today.   No results found. Optho - last year. 66mo ago. S/p cataract removal, new glasses. Doing well.   Dental: every 6 monhts, recent visit.   Alcohol: none  Tobacco: none  Exercise:as above.    History Patient Active Problem List   Diagnosis Date Noted   Nasal septal deviation 05/08/2023   Hypertrophy of both inferior nasal turbinates 05/08/2023   Family history of factor V Leiden mutation 11/06/2019   Essential hypertension 12/10/2011   Hyperlipidemia 12/10/2011   Erectile dysfunction 12/10/2011   Past Medical History:  Diagnosis Date   Allergy    Arthritis    Basal cell carcinoma    Erectile dysfunction    Hyperlipidemia    Hypertension    Past Surgical History:  Procedure Laterality Date   cataract Bilateral    ETHMOIDECTOMY Bilateral 05/08/2023   Procedure: ETHMOIDECTOMY;  Surgeon: Evelina Hippo, MD;  Location: [facility];  Service: ENT;  Laterality: Bilateral;   FRONTAL SINUS EXPLORATION Left 05/08/2023   Procedure: FRONTAL SINUS EXPLORATION;  Surgeon: Evelina Hippo, MD;  Location: [facility];  Service: ENT;  Laterality: Left;    KNEE SURGERY     LUMBAR DISC SURGERY     MAXILLARY ANTROSTOMY Bilateral 05/08/2023   Procedure: MAXILLARY ANTROSTOMY;  Surgeon: Evelina Hippo, MD;  Location: [facility];  Service: ENT;  Laterality: Bilateral;   NASAL SEPTOPLASTY W/ TURBINOPLASTY Bilateral 05/08/2023   Procedure: NASAL SEPTOPLASTY WITH INFERIOR TURBINATE REDUCTION;  Surgeon: Evelina Hippo, MD;  Location: [facility];  Service: ENT;  Laterality: Bilateral;   SINUS ENDO W/FUSION Bilateral 05/08/2023   Procedure: ENDOSCOPIC SINUS SURGERY WITH STEALTH NAVIGATION;  Surgeon: Evelina Hippo, MD;  Location: [facility];  Service: ENT;  Laterality: Bilateral;   SPINE SURGERY     No Known Allergies Prior to Admission medications   Medication Sig Start Date End Date Taking? Authorizing Provider  acetaminophen  (TYLENOL ) 500 MG tablet Take 2 tablets (1,000 mg total) by mouth every 6 (six) hours  as needed. 05/08/23  Yes Milon Aloe B, MD  albuterol  (VENTOLIN  HFA) [phone] Base) MCG/ACT inhaler INHALE 1-2 PUFFS BY MOUTH EVERY 6 HOURS AS NEEDED FOR WHEEZE OR SHORTNESS OF BREATH 03/07/23  Yes Benjiman Bras, MD  Ascorbic Acid (VITAMIN C) 100 MG tablet Take 100 mg by mouth daily.    Yes [provider]  Azelastine -Fluticasone  137-50 MCG/ACT SUSP PLACE 1 SPRAY INTO THE NOSE EVERY 12 (TWELVE) HOURS. 04/24/23  Yes Benjiman Bras, MD  celecoxib (CELEBREX) 50 MG capsule Take 50 mg by mouth 2 (two) times daily. Pt does not know strength   Yes [provider]  doxycycline (ADOXA) 75 MG tablet Take 75 mg by mouth 2 (two) times daily.   Yes [provider]  fluticasone -salmeterol (ADVAIR  HFA) 115-21 MCG/ACT inhaler INHALE 2 PUFFS INTO THE LUNGS TWICE A DAY 01/18/23  Yes Benjiman Bras, MD  gatifloxacin (ZYMAXID) 0.5 % SOLN Place 1 drop into the left eye 4 (four) times daily. 04/26/22  Yes [provider]  glucosamine-chondroitin 500-400 MG tablet Take 1 tablet by mouth 3  (three) times daily.   Yes [provider]  ipratropium (ATROVENT ) 0.06 % nasal spray Place 1-2 sprays into both nostrils 4 (four) times daily. As needed for nasal congestion 12/31/21  Yes Benjiman Bras, MD  lisinopril -hydrochlorothiazide  (ZESTORETIC ) 20-25 MG tablet Take 1 tablet by mouth daily. 01/16/23  Yes Benjiman Bras, MD  loratadine  (CLARITIN ) 10 MG tablet TAKE 1 TABLET BY MOUTH EVERYDAY AT BEDTIME 01/31/23  Yes Benjiman Bras, MD  pravastatin  (PRAVACHOL ) 80 MG tablet Take 1 tablet (80 mg total) by mouth daily. 01/16/23  Yes Benjiman Bras, MD  predniSONE  (DELTASONE ) 2.5 MG tablet Take 2.5 mg by mouth daily with breakfast.   Yes [provider]  sodium chloride  (OCEAN) 0.65 % SOLN nasal spray Place 2 sprays into both nostrils as needed for congestion (after any blood tinged drainage stops). 05/08/23  Yes Milon Aloe B, MD  triamcinolone  ointment (KENALOG ) 0.1 % Apply 1 Application topically 2 (two) times daily. 04/18/23 04/17/24 Yes Jess Morita, MD   Social History   Socioeconomic History   Marital status: Married    Spouse name: Not on file   Number of children: Not on file   Years of education: Not on file   Highest education level: Associate degree: occupational, Scientist, product/process development, or vocational program  Occupational History   Occupation: Radiation protection practitioner: [employer]  Tobacco Use   Smoking status: Former   Smokeless tobacco: Never  Substance and Sexual Activity   Alcohol use: No   Drug use: No   Sexual activity: Yes  Other Topics Concern   Not on file  Social History Narrative   Married. Education: [employer]. Exercise: Walk 2 times a week for 45 minutes.   Social Drivers of Corporate investment banker Strain: Low Risk  (07/17/2023)   Overall Financial Resource Strain (CARDIA)    Difficulty of Paying Living Expenses: Not hard at all  Food Insecurity: No Food Insecurity (07/17/2023)   Hunger Vital Sign    Worried About Running Out of Food  in the Last Year: Never true    Ran Out of Food in the Last Year: Never true  Transportation Needs: No Transportation Needs (07/17/2023)   PRAPARE - Administrator, Civil Service (Medical): No    Lack of Transportation (Non-Medical): No  Physical Activity: Inactive (07/17/2023)   Exercise Vital Sign  Days of Exercise per Week: 0 days    Minutes of Exercise per Session: Not on file  Stress: No Stress Concern Present (07/17/2023)   [employer] of Occupational Health - Occupational Stress Questionnaire    Feeling of Stress: Only a little  Social Connections: Unknown (07/17/2023)   Social Connection and Isolation Panel    Frequency of Communication with Friends and Family: Twice a week    Frequency of Social Gatherings with Friends and Family: Patient declined    Attends Religious Services: Never    Database administrator or Organizations: No    Attends Engineer, structural: Not on file    Marital Status: Married  Intimate Partner Violence: Unknown (08/31/2021)   Received from [facility]   HITS    Physically Hurt: Not on file    Insult or Talk Down To: Not on file    Threaten Physical Harm: Not on file    Scream or Curse: Not on file    Review of Systems 13 point review of systems per patient health survey noted.  Negative other than as indicated above or in HPI.    Objective:   Vitals:   07/20/23 0809  BP: 120/80  Pulse: (!) 51  Temp: 97.8 F (36.6 C)  TempSrc: Oral  SpO2: 98%  Weight: 248 lb (112.5 kg)  Height: 6' 3 (1.905 m)     Physical Exam Vitals reviewed.  Constitutional:      Appearance: He is well-developed.  HENT:     Head: Normocephalic and atraumatic.     Right Ear: External ear normal.     Left Ear: External ear normal.   Eyes:     Conjunctiva/sclera: Conjunctivae normal.     Pupils: Pupils are equal, round, and reactive to light.   Neck:     Thyroid: No thyromegaly.   Cardiovascular:     Rate and Rhythm: Normal rate  and regular rhythm.     Heart sounds: Normal heart sounds.  Pulmonary:     Effort: Pulmonary effort is normal. No respiratory distress.     Breath sounds: Normal breath sounds. No wheezing.  Abdominal:     General: There is no distension.     Palpations: Abdomen is soft.     Tenderness: There is no abdominal tenderness.   Musculoskeletal:        General: No tenderness. Normal range of motion.     Cervical back: Normal range of motion and neck supple.  Lymphadenopathy:     Cervical: No cervical adenopathy.   Skin:    General: Skin is warm and dry.   Neurological:     Mental Status: He is alert and oriented to person, place, and time.     Deep Tendon Reflexes: Reflexes are normal and symmetric.   Psychiatric:        Behavior: Behavior normal.        Assessment & Plan:  Savva Beamer is a 73 y.o. male . Annual physical exam  - -anticipatory guidance as below in AVS, screening labs above. Health maintenance items as above in HPI discussed/recommended as applicable. ]  Allergic rhinitis, unspecified seasonality, unspecified trigger - Plan: loratadine  (CLARITIN ) 10 MG tablet Sinus congestion  - Status post nasal surgery as above.  Continue Claritin , Dymista , and follow-up with ENT, discuss recent congestion with ENT.  Essential hypertension - Plan: lisinopril -hydrochlorothiazide  (ZESTORETIC ) 20-25 MG tablet, Comprehensive metabolic panel with GFR, Ambulatory referral to Sleep Studies  - Stable on current regimen, with  snoring and possible apneic episodes based on Apple Watch, I do think it would be reasonable to meet with sleep specialist, ordered.  Labs pending as above.  Hyperlipidemia, unspecified hyperlipidemia type - Plan: pravastatin  (PRAVACHOL ) 80 MG tablet, Comprehensive metabolic panel with GFR, Lipid panel  - Tolerating current regimen, check labs and adjust plan accordingly.  Mild persistent asthma without complication - Plan: fluticasone -salmeterol (ADVAIR  HFA)  115-21 MCG/ACT inhaler, Hemoglobin A1c  - Stable with Advair , albuterol  if needed.  RTC precautions.  Prediabetes  - Check A1c.  Continue diet/exercise approach.  Plans on possible elliptical for improved exercise.  Discussed alternate coping techniques during stress instead of snacking, RTC precautions.  Screening for malignant neoplasm of prostate - Plan: PSA  Need for pneumococcal vaccination - Plan: Pneumococcal conjugate vaccine 20-valent (Prevnar 20)  Snoring - Plan: Ambulatory referral to Sleep Studies as above.   Meds ordered this encounter  Medications   loratadine  (CLARITIN ) 10 MG tablet    Sig: TAKE 1 TABLET BY MOUTH EVERYDAY AT BEDTIME    Dispense:  90 tablet    Refill:  3   albuterol  (VENTOLIN  HFA) 108 (90 Base) MCG/ACT inhaler    Sig: Inhale 1-2 puffs into the lungs every 6 (six) hours as needed for wheezing or shortness of breath.    Dispense:  18 each    Refill:  1   lisinopril -hydrochlorothiazide  (ZESTORETIC ) 20-25 MG tablet    Sig: Take 1 tablet by mouth daily.    Dispense:  90 tablet    Refill:  3   pravastatin  (PRAVACHOL ) 80 MG tablet    Sig: Take 1 tablet (80 mg total) by mouth daily.    Dispense:  90 tablet    Refill:  3   fluticasone -salmeterol (ADVAIR  HFA) 115-21 MCG/ACT inhaler    Sig: Inhale 2 puffs into the lungs 2 (two) times daily.    Dispense:  12 each    Refill:  12   Patient Instructions  Thank you for coming in today. No change in medications at this time. If there are any concerns on your bloodwork, I will let you know.  Updated pneumonia vaccine given today.  Follow-up with your ENT specialist regarding the sinus congestion concerns.  I did refer you to a sleep specialist to discuss a sleep test for possible sleep apnea.  Let me know if there are questions and take care!  Preventive Care 8 Years and Older, Male Preventive care refers to lifestyle choices and visits with your health care provider that can promote health and wellness.  Preventive care visits are also called wellness exams. What can I expect for my preventive care visit? Counseling During your preventive care visit, your health care provider may ask about your: Medical history, including: Past medical problems. Family medical history. History of falls. Current health, including: Emotional well-being. Home life and relationship well-being. Sexual activity. Memory and ability to understand (cognition). Lifestyle, including: Alcohol, nicotine or tobacco, and drug use. Access to firearms. Diet, exercise, and sleep habits. Work and work Astronomer. Sunscreen use. Safety issues such as seatbelt and bike helmet use. Physical exam Your health care provider will check your: Height and weight. These may be used to calculate your BMI (body mass index). BMI is a measurement that tells if you are at a healthy weight. Waist circumference. This measures the distance around your waistline. This measurement also tells if you are at a healthy weight and may help predict your risk of certain diseases, such as type 2 diabetes  and high blood pressure. Heart rate and blood pressure. Body temperature. Skin for abnormal spots. What immunizations do I need?  Vaccines are usually given at various ages, according to a schedule. Your health care provider will recommend vaccines for you based on your age, medical history, and lifestyle or other factors, such as travel or where you work. What tests do I need? Screening Your health care provider may recommend screening tests for certain conditions. This may include: Lipid and cholesterol levels. Diabetes screening. This is done by checking your blood sugar (glucose) after you have not eaten for a while (fasting). Hepatitis C test. Hepatitis B test. HIV (human immunodeficiency virus) test. STI (sexually transmitted infection) testing, if you are at risk. Lung cancer screening. Colorectal cancer screening. Prostate cancer  screening. Abdominal aortic aneurysm (AAA) screening. You may need this if you are a current or former smoker. Talk with your health care provider about your test results, treatment options, and if necessary, the need for more tests. Follow these instructions at home: Eating and drinking  Eat a diet that includes fresh fruits and vegetables, whole grains, lean protein, and low-fat dairy products. Limit your intake of foods with high amounts of sugar, saturated fats, and salt. Take vitamin and mineral supplements as recommended by your health care provider. Do not drink alcohol if your health care provider tells you not to drink. If you drink alcohol: Limit how much you have to 0-2 drinks a day. Know how much alcohol is in your drink. In the [place] one drink equals one 12 oz bottle of beer (355 mL), one 5 oz glass of wine (148 mL), or one 1 oz glass of hard liquor (44 mL). Lifestyle Brush your teeth every morning and night with fluoride toothpaste. Floss one time each day. Exercise for at least 30 minutes 5 or more days each week. Do not use any products that contain nicotine or tobacco. These products include cigarettes, chewing tobacco, and vaping devices, such as e-cigarettes. If you need help quitting, ask your health care provider. Do not use drugs. If you are sexually active, practice safe sex. Use a condom or other form of protection to prevent STIs. Take aspirin only as told by your health care provider. Make sure that you understand how much to take and what form to take. Work with your health care provider to find out whether it is safe and beneficial for you to take aspirin daily. Ask your health care provider if you need to take a cholesterol-lowering medicine (statin). Find healthy ways to manage stress, such as: Meditation, yoga, or listening to music. Journaling. Talking to a trusted person. Spending time with friends and family. Safety Always wear your seat belt while driving  or riding in a vehicle. Do not drive: If you have been drinking alcohol. Do not ride with someone who has been drinking. When you are tired or distracted. While texting. If you have been using any mind-altering substances or drugs. Wear a helmet and other protective equipment during sports activities. If you have firearms in your house, make sure you follow all gun safety procedures. Minimize exposure to UV radiation to reduce your risk of skin cancer. What's next? Visit your health care provider once a year for an annual wellness visit. Ask your health care provider how often you should have your eyes and teeth checked. Stay up to date on all vaccines. This information is not intended to replace advice given to you by your health care provider. Make  sure you discuss any questions you have with your health care provider. Document Revised: 07/15/2020 Document Reviewed: 07/15/2020 Elsevier Patient Education  2024 Elsevier Inc.     Signed,   Caro Christmas, MD [facility], [facility] 07/20/23 9:00 AM

## 2023-07-26 ENCOUNTER — Ambulatory Visit: Payer: Self-pay | Admitting: Family Medicine

## 2023-07-27 ENCOUNTER — Ambulatory Visit (INDEPENDENT_AMBULATORY_CARE_PROVIDER_SITE_OTHER): Admitting: Otolaryngology

## 2023-08-18 ENCOUNTER — Ambulatory Visit (INDEPENDENT_AMBULATORY_CARE_PROVIDER_SITE_OTHER): Admitting: Otolaryngology

## 2023-08-18 ENCOUNTER — Encounter (INDEPENDENT_AMBULATORY_CARE_PROVIDER_SITE_OTHER): Payer: Self-pay | Admitting: Otolaryngology

## 2023-08-18 VITALS — BP 131/81 | HR 77

## 2023-08-18 DIAGNOSIS — R0981 Nasal congestion: Secondary | ICD-10-CM | POA: Diagnosis not present

## 2023-08-18 DIAGNOSIS — J3489 Other specified disorders of nose and nasal sinuses: Secondary | ICD-10-CM

## 2023-08-18 DIAGNOSIS — J343 Hypertrophy of nasal turbinates: Secondary | ICD-10-CM

## 2023-08-18 DIAGNOSIS — J342 Deviated nasal septum: Secondary | ICD-10-CM | POA: Diagnosis not present

## 2023-08-18 DIAGNOSIS — J328 Other chronic sinusitis: Secondary | ICD-10-CM

## 2023-08-18 DIAGNOSIS — Z9889 Other specified postprocedural states: Secondary | ICD-10-CM

## 2023-08-18 NOTE — Progress Notes (Signed)
 Dear Dr. Levora, Here is my assessment for our mutual patient, Joshua Levine. Thank you for allowing me the opportunity to care for your patient. Please do not hesitate to contact me should you have any other questions. Sincerely, Dr. Eldora Blanch  Otolaryngology Clinic Note Referring provider: Dr. Levora HPI:  Joshua Levine is a 73 y.o. male kindly referred by Dr. Levora for evaluation of chronic sinusitis.  Initial visit (02/2023): He reports chronic sinus trouble all my life. Patient reports that he started to have particularly bad issues with his sinuses this summer with left > right sided maxillary sinus tenderness, and some ethmoid pressure. He reports he also then had discolored drainage, and with bad taste and odor. Lasted for 2 months. He had a dental eval during this time, and reported that his teeth were doing well. He reports that he does not have pain currently and not tasting drainage. He has been prescribed multiple courses of antibiotics - levaquin , augmentin , and 3 more courses (mostly augmentin ). The antibiotics seemed to help, but then came back. He reports that he is doing better now, but back at his baseline (which is as noted below)  He reports that before this, he has chronic issues with his sinuses. Reports bilateral post nasal drip, some anterior rhinorrhea (mucus), and no facial pressure/pain. Breathes fairly well through the nose, occassional congestion. Some left obstruction. Sense of smell is decent.  He gets about 2-3 infections/year for decades - discolored mucus, facial pain -- mostly on left but also intermittent on right.  He currently saline spray, has used astelin , flonase , and atrovent  in the past, currently using. Using dymista  currently and claritin . Has used sinus rinses intermittently  - did not help much.  Allergy testing - last time 5 years ago -- allergic to dust, molds, dogs, grasses,  trees. --------------------------------------------------------- 05/12/2023: S/p b/l FESS, ITR and septoplasty on 05/08/2023. He reports he has been doing ok after surgery - expected blood tinged drainage and nasal obstruction. No significant headache or facial pain. Splints removed today.  --------------------------------------------------------- 05/30/2023 Seen again in follow up after FESS. No epistaxis, does report some nasal congestion. No significant headache or facial pain. He does report that overall he is doing fairly well. -------------------------------------------------------- 06/20/2023 Returns for follow up. No epistaxis, nasal breathing improved. No significant facial pain. He is using dymista  and irrigations.  --------------------------------------------------------- 08/18/2023 Returns for follow up. He reports that he is breathing much better, no headache, facial pain, no sinus infections. He is using dymista  and BID irrigations.    H&N Surgery: see above Personal or FHx of bleeding dz or anesthesia difficulty: no - but family history of Factor V Leiden Mutation  GLP-1: no AP/AC: no  PMHx: HTN, HLD, Arthritis on Celebrex  Tobacco: former. 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 Lives in [place], [place]  Independent Review of Additional Tests or Records:  Dr. Levora (01/16/2023 and 12/16/2022): Dx: Sinusitis in 12/2022; Rx: Augmentin ; Dec 2024: subacute sinusitis, improved after with abx but worsening drainage, discolored drainage, not clear; Rx: levaquin , Ref ENT, CT Sinus Dr. Levora (FM) (07/25/2022 and 09/02/2022): 07/25/2022 - Sinusitis; Rx: Augmentin ; Aug 2024, sinus pain, right face, nothing on dental eval; congestion, drainage, no discolor; Atrovent  has helped; intermittent using afrin; astelin /flonsae using consistently; Rx: symptomatic mgmt CMP 01/16/2023: BUN/Cr: 25/1.01 CBC 11/30/2020: WBC 12.1, Hgb 16, Plt 294; Eos 200  CT Face (01/31/2023): left max  completely opacified, b/l frontal recess MPT, b/l ethmoid mild MPT; left septal deviation  Path  05/08/2023: findings consistent with chronic sinusitis   PMH/Meds/All/SocHx/FamHx/ROS:   Past Medical History:  Diagnosis Date   Allergy    Arthritis    Basal cell carcinoma    Erectile dysfunction    Hyperlipidemia    Hypertension      Past Surgical History:  Procedure Laterality Date   cataract Bilateral    ETHMOIDECTOMY Bilateral 05/08/2023   Procedure: ETHMOIDECTOMY;  Surgeon: Tobie Eldora NOVAK, MD;  Location: [facility];  Service: ENT;  Laterality: Bilateral;   FRONTAL SINUS EXPLORATION Left 05/08/2023   Procedure: FRONTAL SINUS EXPLORATION;  Surgeon: Tobie Eldora NOVAK, MD;  Location: [facility];  Service: ENT;  Laterality: Left;   KNEE SURGERY     LUMBAR DISC SURGERY     MAXILLARY ANTROSTOMY Bilateral 05/08/2023   Procedure: MAXILLARY ANTROSTOMY;  Surgeon: Tobie Eldora NOVAK, MD;  Location: [facility];  Service: ENT;  Laterality: Bilateral;   NASAL SEPTOPLASTY W/ TURBINOPLASTY Bilateral 05/08/2023   Procedure: NASAL SEPTOPLASTY WITH INFERIOR TURBINATE REDUCTION;  Surgeon: Tobie Eldora NOVAK, MD;  Location: [facility];  Service: ENT;  Laterality: Bilateral;   SINUS ENDO W/FUSION Bilateral 05/08/2023   Procedure: ENDOSCOPIC SINUS SURGERY WITH STEALTH NAVIGATION;  Surgeon: Tobie Eldora NOVAK, MD;  Location: [facility];  Service: ENT;  Laterality: Bilateral;   SPINE SURGERY      Family History  Problem Relation Age of Onset   Heart disease Mother    Parkinson's disease Father    Alzheimer's disease Father    Factor V Leiden deficiency Brother      Social Connections: Unknown (07/17/2023)   Social Connection and Isolation Panel    Frequency of Communication with Friends and Family: Twice a week    Frequency of Social Gatherings with Friends and Family: Patient declined    Attends Religious Services: Never    Loss adjuster, chartered or Organizations: No    Attends Engineer, structural: Not on file    Marital Status: Married      Current Outpatient Medications:    acetaminophen  (TYLENOL ) 500 MG tablet, Take 2 tablets (1,000 mg total) by mouth every 6 (six) hours as needed., Disp: 30 tablet, Rfl: 0   albuterol  (VENTOLIN  HFA) 108 (90 Base) MCG/ACT inhaler, Inhale 1-2 puffs into the lungs every 6 (six) hours as needed for wheezing or shortness of breath., Disp: 18 each, Rfl: 1   Ascorbic Acid (VITAMIN C) 100 MG tablet, Take 100 mg by mouth daily. , Disp: , Rfl:    Azelastine -Fluticasone  137-50 MCG/ACT SUSP, PLACE 1 SPRAY INTO THE NOSE EVERY 12 (TWELVE) HOURS., Disp: 23 g, Rfl: 2   celecoxib (CELEBREX) 50 MG capsule, Take 50 mg by mouth 2 (two) times daily. Pt does not know strength, Disp: , Rfl:    fluticasone -salmeterol (ADVAIR  HFA) 115-21 MCG/ACT inhaler, Inhale 2 puffs into the lungs 2 (two) times daily., Disp: 12 each, Rfl: 12   gatifloxacin (ZYMAXID) 0.5 % SOLN, Place 1 drop into the left eye 4 (four) times daily., Disp: , Rfl:    glucosamine-chondroitin 500-400 MG tablet, Take 1 tablet by mouth 3 (three) times daily., Disp: , Rfl:    lisinopril -hydrochlorothiazide  (ZESTORETIC ) 20-25 MG tablet, Take 1 tablet by mouth daily., Disp: 90 tablet, Rfl: 3   loratadine  (CLARITIN ) 10 MG tablet, TAKE 1 TABLET BY MOUTH EVERYDAY AT BEDTIME, Disp: 90 tablet, Rfl: 3   pravastatin  (PRAVACHOL ) 80 MG tablet, Take 1 tablet (80 mg total) by mouth daily.,  Disp: 90 tablet, Rfl: 3   sodium chloride  (OCEAN) 0.65 % SOLN nasal spray, Place 2 sprays into both nostrils as needed for congestion (after any blood tinged drainage stops)., Disp: 30 mL, Rfl: 0   Physical Exam:   BP 131/81 (BP Location: Left Arm, Patient Position: Sitting, Cuff Size: Large)   Pulse 77   SpO2 94%   Salient findings:  CN II-XII intact Anterior rhinoscopy: septum midline, bilateral turbinates reduced; Nasal endoscopy was indicated to better evaluate  the nose and paranasal sinuses, given the patient's history and exam findings, and is detailed below. No respiratory distress or stridor  Seprately Identifiable Procedures:  PROCEDURE: Bilateral Diagnostic Rigid Nasal Endoscopy with Bilateral Debridement Pre-procedure diagnosis: Post-operative examination and care after bilateral Functional Endoscopic Sinus Surgery Post-procedure diagnosis: same Indication: See pre-procedure diagnosis and physical exam above Complications: None apparent EBL: <5 mL Anesthesia: Lidocaine  4% and topical decongestant was topically sprayed in each nasal cavity  Description of Procedure:  Patient was identified and verbal consent obtained after discussion of R/B/A. A rigid 30 degree endoscope was utilized to evaluate the sinonasal cavities, mucosa, sinus ostia and turbinates and septum. Overall, signs of mucosal inflammation are minimal. Also noted are post-surgical changes with some persistent crusting within bilateral ethmoid cavities. These were debrided with a 8 Fr suction straight. After debridement, sinus cavity patency was much improved. No adverse synechiae are noted Right Middle meatus: patent Right SE Recess: more patent after debridement Left MM: patent Left SE Recess: more patent after debridement  CPT CODE -- 68762 - Mod 50      Impression & Plans:  Geary Rufo is a 73 y.o. male with:  1. Other chronic sinusitis   2. Nasal congestion   3. Hypertrophy of both inferior nasal turbinates   4. S/P FESS (functional endoscopic sinus surgery)   5. Nasal septal deviation   6. Nasal obstruction    S/p septoplasty and bilateral FESS April 2025. Sinus cavities debrided today. He is doing quite well  - Continue dymista  BID - Can decrease rinses to daily - can uptitrate depending on how he is doing rinse wise - f/u Nov 2025  See below regarding exact medications prescribed this encounter including dosages and route: No orders of the defined types  were placed in this encounter.   Thank you for allowing me the opportunity to care for your patient. Please do not hesitate to contact me should you have any other questions.  Sincerely, Eldora Blanch, MD Otolaryngologist (ENT), [facility] Phone: [phone] Fax: [phone]  08/18/2023, 3:37 PM   MDM:  Level 3: 99213 Complexity/Problems addressed: mod - chronic problems, stable Data complexity: low - Morbidity: low  - Prescription Drug prescribed or managed: no

## 2023-08-24 ENCOUNTER — Ambulatory Visit: Admitting: Neurology

## 2023-08-24 ENCOUNTER — Encounter: Payer: Self-pay | Admitting: Neurology

## 2023-08-24 VITALS — BP 149/81 | HR 66 | Ht 75.0 in | Wt 240.0 lb

## 2023-08-24 DIAGNOSIS — I10 Essential (primary) hypertension: Secondary | ICD-10-CM

## 2023-08-24 DIAGNOSIS — Z9189 Other specified personal risk factors, not elsewhere classified: Secondary | ICD-10-CM | POA: Diagnosis not present

## 2023-08-24 DIAGNOSIS — R0683 Snoring: Secondary | ICD-10-CM

## 2023-08-24 NOTE — Patient Instructions (Signed)
 Quality Sleep Information, Adult Quality sleep is important for your mental and physical health. It also improves your quality of life. Quality sleep means you: Are asleep for most of the time you are in bed. Fall asleep within 30 minutes. Wake up no more than once a night. Are awake for no longer than 20 minutes if you do wake up during the night. Most adults need 7-8 hours of quality sleep each night. How can poor sleep affect me? If you do not get enough quality sleep, you may have: Mood swings. Daytime sleepiness. Decreased alertness, reaction time, and concentration. Sleep disorders, such as insomnia and sleep apnea. Difficulty with: Solving problems. Coping with stress. Paying attention. These issues may affect your performance and productivity at work, school, and home. Lack of sleep may also put you at higher risk for accidents, suicide, and risky behaviors. If you do not get quality sleep, you may also be at higher risk for several health problems, including: Infections. Type 2 diabetes. Heart disease. High blood pressure. Obesity. Worsening of long-term conditions, like arthritis, kidney disease, depression, Parkinson's disease, and epilepsy. What actions can I take to get more quality sleep? Sleep schedule and routine Stick to a sleep schedule. Go to sleep and wake up at about the same time each day. Do not try to sleep less on weekdays and make up for lost sleep on weekends. This does not work. Limit naps during the day to 30 minutes or less. Do not take naps in the late afternoon. Make time to relax before bed. Reading, listening to music, or taking a hot bath promotes quality sleep. Make your bedroom a place that promotes quality sleep. Keep your bedroom dark, quiet, and at a comfortable room temperature. Make sure your bed is comfortable. Avoid using electronic devices that give off bright blue light for 30 minutes before bedtime. Your brain perceives bright blue  light as sunlight. This includes television, phones, and computers. If you are lying awake in bed for longer than 20 minutes, get up and do a relaxing activity until you feel sleepy. Lifestyle     Try to get at least 30 minutes of exercise on most days. Do not exercise 2-3 hours before going to bed. Do not use any products that contain nicotine or tobacco. These products include cigarettes, chewing tobacco, and vaping devices, such as e-cigarettes. If you need help quitting, ask your health care provider. Do not drink caffeinated beverages for at least 8 hours before going to bed. Coffee, tea, and some sodas contain caffeine. Do not drink alcohol or eat large meals close to bedtime. Try to get at least 30 minutes of sunlight every day. Morning sunlight is best. Medical concerns Work with your health care provider to treat medical conditions that may affect sleeping, such as: Nasal obstruction. Snoring. Sleep apnea and other sleep disorders. Talk to your health care provider if you think any of your prescription medicines may cause you to have difficulty falling or staying asleep. If you have sleep problems, talk with a sleep consultant. If you think you have a sleep disorder, talk with your health care provider about getting evaluated by a specialist. Where to find more information Sleep Foundation: sleepfoundation.org American Academy of Sleep Medicine: aasm.org Centers for Disease Control and Prevention (CDC): TonerPromos.no Contact a health care provider if: You have trouble getting to sleep or staying asleep. You often wake up very early in the morning and cannot get back to sleep. You have  daytime sleepiness. You have daytime sleep attacks of suddenly falling asleep and sudden muscle weakness (narcolepsy). You have a tingling sensation in your legs with a strong urge to move your legs (restless legs syndrome). You stop breathing briefly during sleep (sleep apnea). You think you have a  sleep disorder or are taking a medicine that is affecting your quality of sleep. Summary Most adults need 7-8 hours of quality sleep each night. Getting enough quality sleep is important for your mental and physical health. Make your bedroom a place that promotes quality sleep, and avoid things that may cause you to have poor sleep, such as alcohol, caffeine, smoking, or large meals. Talk to your health care provider if you have trouble falling asleep or staying asleep. This information is not intended to replace advice given to you by your health care provider. Make sure you discuss any questions you have with your health care provider. Document Revised: 05/12/2021 Document Reviewed: 05/12/2021 Elsevier Patient Education  2024 [employer].73 y.o. year old male  here with:   Apple watch reports of poor sleep continuity :      1)  noted snoring, witnessed apneas and apple watch suggested fragmentation of sleep .  Has reported  vivid dreams and dream enactment , he may move his legs, but not violent movements.    2) leg cramping - try magnesium.  Check potassium.    3) we are checking you for Sleep apnea By HST.  RV in 4-5 months.

## 2023-08-24 NOTE — Progress Notes (Signed)
 SLEEP MEDICINE CLINIC    Provider:  Dedra Gores, MD  Primary Care Physician:  Levora Reyes SAUNDERS, MD [address] [place] [address]     Referring Provider: Levora Reyes SAUNDERS, Md [address] [place],  [place] [address]          Chief Complaint according to patient   Patient presents with:     New Patient (Initial Visit)           HISTORY OF PRESENT ILLNESS:  Joshua Levine is a 73 y.o. male patient who  looks younger than his numeric age. He is seen upon Dr Valorie referral on 08/24/2023 for a sleep consult.  Possible sleep apnea to be evaluated and treated.   wife reports apneas and snoring. apple watch indicated sleep apnea to be present, he reports fragmented sleep.   : recent health history :  ENT, Dr. Tobie, chronic sinusitis, nasal septoplasty in April.  Office visit May 20.  Sinus cavities debrided that day.  Nasal obstruction was improved.  Continuing on Dymista , twice daily rinses, 8-week follow-up. Plans to call for some increased drainage, odor to th drainage. No fever.  On claritin  for allergies.  Orthopedics, Dr. Shari, right knee osteoarthritis, celebrex once per day, tylenol .  Dermatology, Dr. Lloyd, history of basal cell carcinoma. Left rib fractures after fall.  Multiple suspected rib fractures noted on imaging.  No pneumothorax, no apparent displaced fractures.  No pleural effusion.  Declined additional pain medication on follow-up phone call. HTN is not well controlled .  Increasing HbA1c: not yet Prediabetic    Sleep relevant medical history: Nocturia some nights , vivid dreams, recent  ENT surgery, Chronic sinusitis- surgical repair.  Family medical /sleep history: NO other family member on CPAP with OSA. Parkinson's disease : Father, paternal GM, she had 6 of 7 siblings with PD, father had 2 brothers and 2 sisters with PD. [name]    Social history: patients  wife is retired. 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 and lives in a private household with spouse. Two adult sons , late forties, 2 grandchildren  (53 and 15 ). Onset I their 80s - [facility]  The patient currently worked late shifts not nights.  Pets ; 2 cats  Tobacco use; [name] Quit after teenage , ETOH use ; none ,  Caffeine intake in form of Coffee( 2-3 / day) Soda( some) Tea ( decaff some) no  energy drinks Exercise in form of  walking.         Sleep habits are as follows: The patient's dinner time is around  5.15  PM. The patient goes to bed at 8.30  PM and continues to sleep in intervals of 2 hours for 6-8 hours,  for unknown causes. Unaware of any stimuli- .   The preferred sleep position is on either side ,  knee arthritis, with the support of 2 pillows. No GERD> Dreams are reportedly frequent/vivid.   The patient wakes up spontaneously at 5 AM - without an alarm. 5.15   AM is the usual rise time. He reports  feeling refreshed or restored in AM, with occasional symptoms such as dry mouth, no recent morning headaches, and residual fatigue. Naps are taken infrequently, on weekends only - and only when at rest-  lasting 60 minutes and are sometimes refreshing .  Does interfere with nocturnal sleep.    Review of Systems: Out of a complete 14 system review, the patient complains of only the following symptoms, and all other reviewed systems are negative.:  Fatigue, sleepiness , snoring, fragmented sleep   How likely are you to doze in the following situations: 0 = not likely, 1 = slight chance, 2 = moderate chance, 3 = high chance   Sitting and Reading? Watching Television? Sitting inactive in a public place (theater or meeting)? As a passenger in a car for an hour without a break? Lying down in the afternoon when circumstances permit? Sitting and talking to someone? Sitting quietly after lunch without alcohol? In a car, while stopped for a few  minutes in traffic?   Total = 6/ 24 points   FSS endorsed at 24/ 63 points.  GDS 1/ 15   Social History   Socioeconomic History   Marital status: Married    Spouse name: Not on file   Number of children: Not on file   Years of education: Not on file   Highest education level: Associate degree: occupational, Scientist, product/process development, or vocational program  Occupational History   Occupation: Radiation protection practitioner: [employer]  Tobacco Use   Smoking status: Former   Smokeless tobacco: Never  Substance and Sexual Activity   Alcohol use: No   Drug use: No   Sexual activity: Yes  Other Topics Concern   Not on file  Social History Narrative   Married. Education: [employer]. Exercise: Walk 2 times a week for 45 minutes.   Social Drivers of Corporate investment banker Strain: Low Risk  (07/17/2023)   Overall Financial Resource Strain (CARDIA)    Difficulty of Paying Living Expenses: Not hard at all  Food Insecurity: No Food Insecurity (07/17/2023)   Hunger Vital Sign    Worried About Running Out of Food in the Last Year: Never true    Ran Out of Food in the Last Year: Never true  Transportation Needs: No Transportation Needs (07/17/2023)   PRAPARE - Administrator, Civil Service (Medical): No    Lack of Transportation (Non-Medical): No  Physical Activity: Inactive (07/17/2023)   Exercise Vital Sign    Days of Exercise per Week: 0 days    Minutes of Exercise per Session: Not on file  Stress: No Stress Concern Present (07/17/2023)   [employer] of Occupational Health - Occupational Stress Questionnaire    Feeling of Stress: Only a little  Social Connections: Unknown (07/17/2023)   Social Connection and Isolation Panel    Frequency of Communication with Friends and Family: Twice a week    Frequency of Social Gatherings with Friends and Family: Patient declined    Attends Religious Services: Never    Database administrator or Organizations: No    Attends Hospital doctor: Not on file    Marital Status: Married    Family History  Problem Relation Age of Onset   Heart disease Mother    Parkinson's disease Father    Alzheimer's disease Father    Factor V Leiden deficiency Brother     Past Medical History:  Diagnosis Date   Allergy    Arthritis    Basal cell carcinoma    Erectile dysfunction    Hyperlipidemia    Hypertension     Past Surgical History:  Procedure Laterality Date   cataract Bilateral    ETHMOIDECTOMY Bilateral 05/08/2023   Procedure: ETHMOIDECTOMY;  Surgeon:  Tobie Eldora NOVAK, MD;  Location: [facility];  Service: ENT;  Laterality: Bilateral;   FRONTAL SINUS EXPLORATION Left 05/08/2023   Procedure: FRONTAL SINUS EXPLORATION;  Surgeon: Tobie Eldora NOVAK, MD;  Location: [facility];  Service: ENT;  Laterality: Left;   KNEE SURGERY     LUMBAR DISC SURGERY     MAXILLARY ANTROSTOMY Bilateral 05/08/2023   Procedure: MAXILLARY ANTROSTOMY;  Surgeon: Tobie Eldora NOVAK, MD;  Location: [facility];  Service: ENT;  Laterality: Bilateral;   NASAL SEPTOPLASTY W/ TURBINOPLASTY Bilateral 05/08/2023   Procedure: NASAL SEPTOPLASTY WITH INFERIOR TURBINATE REDUCTION;  Surgeon: Tobie Eldora NOVAK, MD;  Location: [facility];  Service: ENT;  Laterality: Bilateral;   SINUS ENDO W/FUSION Bilateral 05/08/2023   Procedure: ENDOSCOPIC SINUS SURGERY WITH STEALTH NAVIGATION;  Surgeon: Tobie Eldora NOVAK, MD;  Location: [facility];  Service: ENT;  Laterality: Bilateral;   SPINE SURGERY       Current Outpatient Medications on File Prior to Visit  Medication Sig Dispense Refill   acetaminophen  (TYLENOL ) 500 MG tablet Take 2 tablets (1,000 mg total) by mouth every 6 (six) hours as needed. 30 tablet 0   albuterol  (VENTOLIN  HFA) 108 (90 Base) MCG/ACT inhaler Inhale 1-2 puffs into the lungs every 6 (six) hours as needed for wheezing or shortness of breath. 18 each 1   Ascorbic Acid (VITAMIN C) 100 MG tablet  Take 100 mg by mouth daily.      Azelastine -Fluticasone  137-50 MCG/ACT SUSP PLACE 1 SPRAY INTO THE NOSE EVERY 12 (TWELVE) HOURS. 23 g 2   celecoxib (CELEBREX) 200 MG capsule Take 200 mg by mouth daily as needed.     fluticasone -salmeterol (ADVAIR  HFA) 115-21 MCG/ACT inhaler Inhale 2 puffs into the lungs 2 (two) times daily. 12 each 12   gatifloxacin (ZYMAXID) 0.5 % SOLN Place 1 drop into the left eye 4 (four) times daily.     glucosamine-chondroitin 500-400 MG tablet Take 1 tablet by mouth 3 (three) times daily.     lisinopril -hydrochlorothiazide  (ZESTORETIC ) 20-25 MG tablet Take 1 tablet by mouth daily. 90 tablet 3   loratadine  (CLARITIN ) 10 MG tablet TAKE 1 TABLET BY MOUTH EVERYDAY AT BEDTIME 90 tablet 3   pravastatin  (PRAVACHOL ) 80 MG tablet Take 1 tablet (80 mg total) by mouth daily. 90 tablet 3   sodium chloride  (OCEAN) 0.65 % SOLN nasal spray Place 2 sprays into both nostrils as needed for congestion (after any blood tinged drainage stops). 30 mL 0   No current facility-administered medications on file prior to visit.    No Known Allergies   DIAGNOSTIC DATA (LABS, IMAGING, TESTING) - I reviewed patient records, labs, notes, testing and imaging myself where available.  Lab Results  Component Value Date   WBC 12.1 (H) 11/30/2020   HGB 16.0 11/30/2020   HCT 48.0 11/30/2020   MCV 91.8 11/30/2020   PLT 294.0 11/30/2020      Component Value Date/Time   NA 138 07/20/2023 0905   NA 137 11/06/2019 1549   K 4.9 07/20/2023 0905   CL 102 07/20/2023 0905   CO2 27 07/20/2023 0905   GLUCOSE 112 (H) 07/20/2023 0905   BUN 23 07/20/2023 0905   BUN 16 11/06/2019 1549   CREATININE 1.13 07/20/2023 0905   CREATININE 1.13 02/20/2015 1858   CALCIUM  10.2 07/20/2023 0905   PROT 7.3 07/20/2023 0905   PROT 7.2 11/06/2019 1549   ALBUMIN 4.7 07/20/2023 0905   ALBUMIN 5.0 (H) 11/06/2019  1549   AST 27 07/20/2023 0905   ALT 33 07/20/2023 0905   ALKPHOS 64 07/20/2023 0905   BILITOT 0.4 07/20/2023  0905   BILITOT 0.3 11/06/2019 1549   GFRNONAA >60 05/03/2023 1049   GFRAA 85 11/06/2019 1549   Lab Results  Component Value Date   CHOL 161 07/20/2023   HDL 34.80 (L) 07/20/2023   LDLCALC 56 07/20/2023   LDLDIRECT 100.0 07/08/2022   TRIG 350.0 (H) 07/20/2023   CHOLHDL 5 07/20/2023   Lab Results  Component Value Date   HGBA1C 6.2 07/20/2023   No results found for: VITAMINB12 Lab Results  Component Value Date   TSH 2.958 11/12/2012    PHYSICAL EXAM:  Today's Vitals   08/24/23 1552 08/24/23 1554  BP: (!) 151/83 (!) 149/81  Pulse: 66 66  Weight: 240 lb (108.9 kg)   Height: 6' 3 (1.905 m)    Body mass index is 30 kg/m.   Wt Readings from Last 3 Encounters:  08/24/23 240 lb (108.9 kg)  07/20/23 248 lb (112.5 kg)  06/20/23 240 lb (108.9 kg)     Ht Readings from Last 3 Encounters:  08/24/23 6' 3 (1.905 m)  07/20/23 6' 3 (1.905 m)  06/20/23 6' 3 (1.905 m)      General: The patient is awake, alert and appears not in acute distress. The patient is well groomed. Head: Normocephalic, atraumatic. Neck is supple. Mallampati 2,  neck circumference:18 inches . Nasal airflow fully  patent.   Retrognathia is not seen.  Dental status: biological  Cardiovascular:  Regular rate and cardiac rhythm by pulse,   without distended neck veins. Respiratory: Lungs are clear to auscultation.  Skin:  Without evidence of ankle edema, or rash. Trunk: The patient's posture is erect.   NEUROLOGIC EXAM: The patient is awake and alert, oriented to place and time.   Memory subjective described as intact.  Attention span & concentration ability appears normal.  Speech is fluent,  without  dysarthria, dysphonia or aphasia.  Mood and affect are appropriate.   Cranial nerves: no loss of smell or taste reported  Pupils are equal and briskly reactive to light. Cataract surgery bilaterally  Extraocular movements in vertical and horizontal planes were intact and without nystagmus.  No  Diplopia. Visual fields by finger perimetry are intact. Hearing was not impaired to soft voice.    Patient speaks loud.    Facial sensation intact to fine touch.  Facial motor strength is symmetric and tongue and uvula move midline.  Neck ROM : rotation, tilt and flexion extension were normal for age and shoulder shrug was symmetrical.    Motor exam:  Symmetric bulk, tone and ROM.   Normal tone with cog- wheeling, symmetric grip strength .   Sensory:  Fine touch and vibration were tested  and  normal.  He reports feeling sometimes his arms asleep in AM>  Proprioception tested in the upper extremities was normal.   Coordination: Rapid alternating movements in the fingers/hands were of normal speed.  The Finger-to-nose maneuver was intact without evidence of ataxia, dysmetria and a mild action tremor.   Gait and station: Patient could rise unassisted from a seated position, walked without assistive device.  Stance is of normal width/ base . The  patient turned with 3 steps.  Toe and heel walk were deferred.  Deep tendon reflexes: in the  upper and lower extremities are symmetric and intact.   ASSESSMENT AND PLAN:  73 y.o. year old male  here  with:  Apple watch reports of poor sleep continuity :     1)  noted snoring, witnessed apneas and apple watch suggested fragmentation of sleep .  Has reported  vivid dreams and dream enactment , he may move his legs, but not violent movements.   2) leg cramping - try magnesium.  Check potassium.   3) we are checking you for Sleep apnea By HST.  RV in 4-5 months.     I would like to thank Levora Reyes SAUNDERS, MD and Levora Reyes SAUNDERS, Md [address] [place],  [place] [address] for allowing me to meet with and to take care of this pleasant patient.   Discussion of sleep hygiene setting bedtime and rise time,  hot shower  before bed time, no screen light in the bedroom, the bedroom should be cool, quiet and dark. Night lights should  illuminate the floor not shine into your eyes. Golden glow  light is less intrusive than blue or cold light.  Read in a book with pages, not on a device. Consider audio books and soothing  sound -scapes.    After spending a total time of  35  minutes face to face and additional time for physical and neurologic examination, review of laboratory studies,  personal review of imaging studies, reports and results of other testing and review of referral information / records as far as provided in visit,   Electronically signed by: Dedra Gores, MD 08/24/2023 3:59 PM  [facility] and [facility] Board certified by The [employer] of Sleep Medicine and Diplomate of the [employer] of Sleep Medicine. Board certified In Neurology through the ABPN, Fellow of the [employer] of Neurology.

## 2023-08-24 NOTE — Addendum Note (Signed)
 Addended by: CHALICE SAUNAS on: 08/24/2023 04:34 PM   Modules accepted: Orders

## 2023-09-12 ENCOUNTER — Ambulatory Visit (INDEPENDENT_AMBULATORY_CARE_PROVIDER_SITE_OTHER): Admitting: Neurology

## 2023-09-12 DIAGNOSIS — G4733 Obstructive sleep apnea (adult) (pediatric): Secondary | ICD-10-CM | POA: Diagnosis not present

## 2023-09-12 DIAGNOSIS — R0683 Snoring: Secondary | ICD-10-CM

## 2023-09-12 DIAGNOSIS — I10 Essential (primary) hypertension: Secondary | ICD-10-CM

## 2023-09-12 DIAGNOSIS — Z9189 Other specified personal risk factors, not elsewhere classified: Secondary | ICD-10-CM

## 2023-09-14 NOTE — Progress Notes (Unsigned)
 Joshua Levine

## 2023-09-20 ENCOUNTER — Ambulatory Visit: Payer: Self-pay | Admitting: Neurology

## 2023-09-20 DIAGNOSIS — G4733 Obstructive sleep apnea (adult) (pediatric): Secondary | ICD-10-CM | POA: Insufficient documentation

## 2023-09-20 MED ORDER — ALPRAZOLAM 0.25 MG PO TABS: 0.2500 mg | ORAL_TABLET | Freq: Every evening | ORAL | 0 refills | Status: DC | PRN

## 2023-09-20 NOTE — Procedures (Signed)
 [facility]  [name] 73 year old male [birth year]   HOME SLEEP TEST REPORT ( by Watch PAT)   STUDY DATE:  09-12-2023    ORDERING CLINICIAN:  Dedra Gores, MD  REFERRING CLINICIAN: PCP Levora, MD    CLINICAL INFORMATION/HISTORY: Joshua Levine is a 73 y.o. male patient who is seen upon Dr Valorie referral on 08/24/2023 for a sleep consult. Possible sleep apnea to be evaluated and treated.  His wife reports apneas and snoring. His Apple watch indicated sleep apnea to be present, he reports fragmented sleep.    :Recent health history :  ENT, Dr. Tobie, chronic sinusitis,underwent  nasal septoplasty in April.  Office visit May 20.  Sinus cavities debrided that day.  Nasal obstruction was improved.  Continuing on Dymista ,  Orthopedics, Dr. Shari, right knee osteoarthritis, celebrex once per day, tylenol .  Dermatology, Dr. Lloyd, history of basal cell carcinoma. Left rib fractures after fall.  Multiple suspected rib fractures noted on imaging.  No pneumothorax, no apparent displaced fractures.  No pleural effusion.  Declined additional pain medication HTN is not well controlled .  Increasing HbA1c: not yet Prediabetic    Sleep relevant medical history: Nocturia some nights , vivid dreams, recent  ENT surgery, Chronic sinusitis- surgical repair. Has reported  vivid dreams and dream enactment , he may move his legs, but no violent movements.- leg cramping.  Family medical /sleep history: NO family member with OSA.  Parkinson's disease : Father, paternal GM, she had 6 of 7 siblings with PD, father had 2 brothers and 2 sisters with PD. [name]      Epworth sleepiness score:  6/ 24 points , FSS endorsed at 24/ 63 points. ,GDS 1/ 15      BMI:  30 kg/m   Neck Circumference: 18    FINDINGS:   Sleep Summary:   Total Recording Time (hours, min):    8 hours 58 minutes   Total Sleep Time (hours, min):     Likely total sleep time was   8 hours 2 minutes-but the  device indicated that the technically valid sleep time was only 6 hours 20 minutes (?)          Percent REM (%):   10.5%      Sleep latency was only 10 minutes REM sleep latency was 380 minutes long and about 45 minutes of wakefulness after sleep onset time.                                 Respiratory Indices:   Calculated pAHI (per CMS guideline):[name]    The total AHI was 13.4/h which is mild sleep apnea    -no central events were detected.                 REM pAHI:      The REM AHI was lower than the non-REM AHI was 7.5/h                                           NREM pAHI:   13.5/h  .                          Positional AHI:   The patient slept mostly on his sides  308 minutes on his left and 135 minutes on his right.  Left lateral sleep was associated with an AHI of 7.5/h and right sided sleep with an AHI of almost 17/h.  Most stunningly supine sleep was seen for only 18.5 minutes but here the AHI exacerbated to 74/h.  There is a clear positional component.  Snoring level reached a mean volume of 41 dB which is considered mild. Snoring:                                                Oxygen Saturation Statistics:   Oxygen Saturation (%) Mean:   Mean oxygenation was 95% between a minimum of 87 and a maximum of 99% with 0 minutes of hypoxemia.              Pulse Rate Statistics:   Pulse Mean (bpm):   Mean heart rate was 55 bpm between the minimum at 31 bpm and a maximum of 95 bpm.  The bradycardic event did not coincide with REM sleep.                              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 The test did not reveal any hypoxia to be present.  There were several brief bradycardic events, mild snoring.  This was clearly positional dependent sleep apnea.  There were multiple brief.'s of wakefulness throughout the night, reflecting the patient's report of fragmented sleep.   RECOMMENDATIONS: The treatment for an mild positional  dependent sleep apnea is to avoid supine sleep.    I also will offer at least a trial of positive airway pressure therapy to this patient since he has experienced increased blood pressures, fragmented sleep, and there was bradycardia seen which is a possible stress response to apneic events. My recommendation is toreturn for an in-lab titration based on the patient's reported dream enactment and other factors that we could not capture in the home sleep test.  This way, we can treat apnea in the same night but also observe the patient's muscle tone and behavior.   Given that there was no REM sleep dependent apnea and no hypoxia present, the patient could also alternatively try for a dental device.  I have to explain in advance that these are more successful in treating snoring then obstructive sleep apnea.   This patient did remarkably well with left lateral sleep and I would suggest this to be his main sleep position in the future.   I will also encourage this patient to work on weight stability ( avoid gaining, possibly losing some pounds) and on core strength to improve deep breathing.  I recommend in general to avoid alcohol or sedatives at night as these can induce apnea and certainly increase snoring.      Any patient should be cautioned not to drive, work at heights, or operate dangerous or heavy equipment when tired or sleepy.   Review of good sleep hygiene measures is accessible to any sleep clinic patient and can be reiterated through online material- I we recommend the Guide to better Sleep   by the NIH.   Weight loss and Core Strength improvement is highly recommended for individuals with low muscle tone and/ or a BMI over 30.  Any CPAP patient  should be reminded to be fully compliant with PAP therapy , (defined as using PAP therapy for more than 4 hours each night ) with the goal to improve sleep related symptoms and decrease long term cardiovascular risks. Any PAP therapy patient  should be reminded, that it may take up to 3 months to get fully used to using PAP and it may take 1-2 weeks for an established CPAP user to acclimatize to changes in pressure or mask. The earlier full compliance is achieved, the better long term compliance tends to be.   Please note that untreated obstructive sleep apnea may carry additional perioperative morbidity. Patients with significant obstructive sleep apnea should receive perioperative PAP therapy and the surgical team should be informed of the diagnosis and degree of sleep disordered breathing.  Sleep fragmentation in the presence of normal proportional sleep stages is a nonspecific findings and per se does not signify an intrinsic sleep disorder or a cause for the patient's sleep-related symptoms.  Causes include (but are not limited to) the unfamiliarity of sleeping while recorded by HST device or sleeping in a sleep lab for a full Polysomnography sleep study, but also circadian rhythm disturbances, medication side effects or an underlying mood disorder or medical problem.   The referring physician will be notified of the test results.       INTERPRETING PHYSICIAN:   Dedra Gores, MD  [facility] and [facility] Sleep Board certified by The [employer] of Sleep Medicine and Diplomate of the [employer] of Sleep Medicine. Board certified In Neurology through the ABPN, Fellow of the [employer] of Neurology.

## 2023-09-27 NOTE — Telephone Encounter (Signed)
-----   Message from [place] Dohmeier sent at 09/20/2023  6:52 PM EDT ----- Mr petrelli has OSA, obstructive sleep apnea, strongly seen in supine sleep, not dependent on REM sleep. No hypoxia. Several small arousals with brief wakefulness .  The AHI was 0 while sleeping prone and under 5 while sleeping on his left side - this would be my recommended sleep positions.  Still , there may be apnea beyond the positional component and CPAP  should be offered    The  finding that made me decide to invite Mr Pokorny for a in -lab- CPAP titration was the bradycardia  finding, heart rates as slow as 31 bpm, not an artefact by close review. We can look at the  heart rhythm while he is in the lab and we can look at muscle tone and movements as well.  I ask him to permit us  to do an in lab CPAP titration, and he has a small dose of xanax  for this available at his pharmacy, to be taken at the sleep lab.     ----- Message ----- From: Chalice Saunas, MD Sent: 09/20/2023   6:41 PM EDT To: Saunas Chalice, MD

## 2023-09-27 NOTE — Telephone Encounter (Signed)
 Called patient to discuss sleep study results. No answer at this time. LVM for the patient to call back.  Will send a mychart message as well.

## 2023-10-04 ENCOUNTER — Telehealth: Payer: Self-pay | Admitting: Neurology

## 2023-10-04 DIAGNOSIS — R0683 Snoring: Secondary | ICD-10-CM

## 2023-10-04 DIAGNOSIS — Z9189 Other specified personal risk factors, not elsewhere classified: Secondary | ICD-10-CM

## 2023-10-04 DIAGNOSIS — G4733 Obstructive sleep apnea (adult) (pediatric): Secondary | ICD-10-CM

## 2023-10-04 NOTE — Addendum Note (Signed)
 Addended by: CHALICE SAUNAS on: 10/04/2023 02:05 PM   Modules accepted: Orders

## 2023-10-04 NOTE — Addendum Note (Signed)
 Addended by: DELFINO AUGUSTIN BROCKS on: 10/04/2023 10:26 AM   Modules accepted: Orders

## 2023-10-04 NOTE — Telephone Encounter (Signed)
 [employer] is not approving the CPAP Sleep study. They are wanting the patient to try APAP therapy.

## 2023-10-04 NOTE — Telephone Encounter (Signed)
 ResMed autotitration CPAP device with a setting from 5-12 cm water, 2 cm EPR , heated humidification and  resmed mask of choice. Dedra Gores, MD

## 2023-10-19 ENCOUNTER — Other Ambulatory Visit: Payer: Self-pay | Admitting: Family Medicine

## 2023-10-19 DIAGNOSIS — J309 Allergic rhinitis, unspecified: Secondary | ICD-10-CM

## 2023-10-31 NOTE — Telephone Encounter (Signed)
 Spoke w/Pt in f/u on decision for CPAP. Pt stated he is not interested in going that route. Pt stated he doesn't feel he needs the CPAP and if he got it, doesn't think he will stay with it. Voiced understanding w/Pt and to let our office know if we can further assist him in any way. Pt voiced understanding and thanks for the f/u call.

## 2023-11-10 DIAGNOSIS — M170 Bilateral primary osteoarthritis of knee: Secondary | ICD-10-CM | POA: Diagnosis not present

## 2023-11-17 DIAGNOSIS — M170 Bilateral primary osteoarthritis of knee: Secondary | ICD-10-CM | POA: Diagnosis not present

## 2023-11-24 DIAGNOSIS — M170 Bilateral primary osteoarthritis of knee: Secondary | ICD-10-CM | POA: Diagnosis not present

## 2023-12-21 ENCOUNTER — Encounter (INDEPENDENT_AMBULATORY_CARE_PROVIDER_SITE_OTHER): Payer: Self-pay | Admitting: Otolaryngology

## 2023-12-21 ENCOUNTER — Ambulatory Visit (INDEPENDENT_AMBULATORY_CARE_PROVIDER_SITE_OTHER): Admitting: Otolaryngology

## 2023-12-21 VITALS — BP 138/76 | HR 93 | Ht 75.0 in

## 2023-12-21 DIAGNOSIS — J343 Hypertrophy of nasal turbinates: Secondary | ICD-10-CM

## 2023-12-21 DIAGNOSIS — J328 Other chronic sinusitis: Secondary | ICD-10-CM

## 2023-12-21 DIAGNOSIS — J3489 Other specified disorders of nose and nasal sinuses: Secondary | ICD-10-CM

## 2023-12-21 DIAGNOSIS — J342 Deviated nasal septum: Secondary | ICD-10-CM

## 2023-12-21 DIAGNOSIS — R0981 Nasal congestion: Secondary | ICD-10-CM

## 2023-12-21 DIAGNOSIS — Z9889 Other specified postprocedural states: Secondary | ICD-10-CM

## 2023-12-21 DIAGNOSIS — J329 Chronic sinusitis, unspecified: Secondary | ICD-10-CM

## 2023-12-21 MED ORDER — MUPIROCIN 2 % EX OINT: 1.0000 | TOPICAL_OINTMENT | Freq: Two times a day (BID) | CUTANEOUS | 0 refills | Status: AC

## 2023-12-21 MED ORDER — PREDNISOLONE ACETATE 1 % OP SUSP: OPHTHALMIC | 1 refills | Status: AC

## 2023-12-21 NOTE — Patient Instructions (Addendum)
 Put 6 drops of the pred forte  drops in the rinse bottle once a day and rinse 1/2 on each side into the nose Put 1 inch ribbon of the mupirocin  ointment into the rinse bottle once a day and rinse 1/2 on each side. You can warm up the solution gently to help mix it.   Rinse twice a day any time you feel like you have a sinus infection

## 2023-12-21 NOTE — Progress Notes (Signed)
 Dear Dr. Levora, Here is my assessment for our mutual Joshua, Joshua Levine. Thank you for allowing me the opportunity to care for your Joshua. Please do not hesitate to contact me should you have any other questions. Sincerely, Dr. Eldora Blanch  Otolaryngology Clinic Note Referring provider: Dr. Levora HPI:  Joshua Levine is a 73 y.o. male kindly referred by Dr. Levora for evaluation of chronic sinusitis.  Initial visit (02/2023): He reports chronic sinus trouble all my life. Joshua reports that he started to have particularly bad issues with his sinuses this summer with left > right sided maxillary sinus tenderness, and some ethmoid pressure. He reports he also then had discolored drainage, and with bad taste and odor. Lasted for 2 months. He had a dental eval during this time, and reported that his teeth were doing well. He reports that he does not have pain currently and not tasting drainage. He has been prescribed multiple courses of antibiotics - levaquin , augmentin , and 3 more courses (mostly augmentin ). The antibiotics seemed to help, but then came back. He reports that he is doing better now, but back at his baseline (which is as noted below)  He reports that before this, he has chronic issues with his sinuses. Reports bilateral post Joshua drip, some anterior rhinorrhea (mucus), and no facial pressure/pain. Breathes fairly well through the nose, occassional congestion. Some left obstruction. Sense of smell is decent.  He gets about 2-3 infections/year for decades - discolored mucus, facial pain -- mostly on left but also intermittent on right.  He currently saline spray, has used astelin , flonase , and atrovent  in the past, currently using. Using dymista  currently and claritin . Has used sinus rinses intermittently  - did not help much.  Allergy testing - last time 5 years ago -- allergic to dust, molds, dogs, grasses,  trees. --------------------------------------------------------- 05/12/2023: S/p b/l FESS, ITR and septoplasty on 05/08/2023. He reports he has been doing ok after surgery - expected blood tinged drainage and Joshua obstruction. No significant headache or facial pain. Splints removed today.  --------------------------------------------------------- 05/30/2023 Seen again in follow up after FESS. No epistaxis, does report some Joshua congestion. No significant headache or facial pain. He does report that overall he is doing fairly well. -------------------------------------------------------- 06/20/2023 Returns for follow up. No epistaxis, Joshua breathing improved. No significant facial pain. He is using dymista  and irrigations.  --------------------------------------------------------- 08/18/2023 Returns for follow up. He reports that he is breathing much better, no headache, facial pain, no sinus infections. He is using dymista  and BID irrigations.  --------------------------------------------------------- 12/21/2023 Returns for follow up. He reports that he had some left sided discolored drainage, and modest pressure. Feels like he is having an exacerbation with his sinuses. Using dymista , but only daily irrigations. No abx recently.   H&N Surgery: see above Personal or FHx of bleeding dz or anesthesia difficulty: no - but family history of Factor V Leiden Mutation  GLP-1: no AP/AC: no  PMHx: HTN, HLD, Arthritis on Celebrex  Tobacco: former. 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 Lives in [place], [place]  Independent Review of Additional Tests or Records:  Dr. Levora (01/16/2023 and 12/16/2022): Dx: Sinusitis in 12/2022; Rx: Augmentin ; Dec 2024: subacute sinusitis, improved after with abx but worsening drainage, discolored drainage, not clear; Rx: levaquin , Ref ENT, CT Sinus Dr. Levora (FM) (07/25/2022 and 09/02/2022): 07/25/2022 - Sinusitis; Rx: Augmentin ; Aug 2024, sinus pain,  right face, nothing on dental eval; congestion, drainage, no discolor; Atrovent  has helped; intermittent using afrin; astelin /flonsae using consistently;  Rx: symptomatic mgmt CMP 01/16/2023: BUN/Cr: 25/1.01 CBC 11/30/2020: WBC 12.1, Hgb 16, Plt 294; Eos 200  CT Face (01/31/2023): left max completely opacified, b/l frontal recess MPT, b/l ethmoid mild MPT; left septal deviation  Path 05/08/2023: findings consistent with chronic sinusitis   PMH/Meds/All/SocHx/FamHx/ROS:   Past Medical History:  Diagnosis Date   Allergy    Arthritis    Basal cell carcinoma    Erectile dysfunction    Hyperlipidemia    Hypertension      Past Surgical History:  Procedure Laterality Date   cataract Bilateral    ETHMOIDECTOMY Bilateral 05/08/2023   Procedure: ETHMOIDECTOMY;  Surgeon: Tobie Eldora NOVAK, MD;  Location: [facility];  Service: ENT;  Laterality: Bilateral;   FRONTAL SINUS EXPLORATION Left 05/08/2023   Procedure: FRONTAL SINUS EXPLORATION;  Surgeon: Tobie Eldora NOVAK, MD;  Location: [facility];  Service: ENT;  Laterality: Left;   KNEE SURGERY     LUMBAR DISC SURGERY     MAXILLARY ANTROSTOMY Bilateral 05/08/2023   Procedure: MAXILLARY ANTROSTOMY;  Surgeon: Tobie Eldora NOVAK, MD;  Location: [facility];  Service: ENT;  Laterality: Bilateral;   Joshua SEPTOPLASTY W/ TURBINOPLASTY Bilateral 05/08/2023   Procedure: Joshua SEPTOPLASTY WITH INFERIOR TURBINATE REDUCTION;  Surgeon: Tobie Eldora NOVAK, MD;  Location: [facility];  Service: ENT;  Laterality: Bilateral;   SINUS ENDO W/FUSION Bilateral 05/08/2023   Procedure: ENDOSCOPIC SINUS SURGERY WITH STEALTH NAVIGATION;  Surgeon: Tobie Eldora NOVAK, MD;  Location: [facility];  Service: ENT;  Laterality: Bilateral;   SPINE SURGERY      Family History  Problem Relation Age of Onset   Heart disease Mother    Parkinson's disease Father    Alzheimer's disease Father    Factor V Leiden deficiency  Brother      Social Connections: Unknown (07/17/2023)   Social Connection and Isolation Panel    Frequency of Communication with Friends and Family: Twice a week    Frequency of Social Gatherings with Friends and Family: Joshua declined    Attends Religious Services: Never    Database Administrator or Organizations: No    Attends Engineer, Structural: Not on file    Marital Status: Married      Current Outpatient Medications:    acetaminophen  (TYLENOL ) 500 MG tablet, Take 2 tablets (1,000 mg total) by mouth every 6 (six) hours as needed., Disp: 30 tablet, Rfl: 0   albuterol  (VENTOLIN  HFA) 108 (90 Base) MCG/ACT inhaler, Inhale 1-2 puffs into the lungs every 6 (six) hours as needed for wheezing or shortness of breath., Disp: 18 each, Rfl: 1   ALPRAZolam  (XANAX ) 0.25 MG tablet, Take 1 tablet (0.25 mg total) by mouth at bedtime as needed for sleep (for sleep lab -premedication as needed)., Disp: 2 tablet, Rfl: 0   Ascorbic Acid (VITAMIN C) 100 MG tablet, Take 100 mg by mouth daily. , Disp: , Rfl:    Azelastine -Fluticasone  137-50 MCG/ACT SUSP, PLACE 1 SPRAY INTO THE NOSE EVERY 12 (TWELVE) HOURS., Disp: 137 g, Rfl: 2   celecoxib (CELEBREX) 200 MG capsule, Take 200 mg by mouth daily as needed., Disp: , Rfl:    fluticasone -salmeterol (ADVAIR  HFA) 115-21 MCG/ACT inhaler, Inhale 2 puffs into the lungs 2 (two) times daily., Disp: 12 each, Rfl: 12   glucosamine-chondroitin 500-400 MG tablet, Take 1 tablet by mouth 3 (three) times daily., Disp: , Rfl:    lisinopril -hydrochlorothiazide  (ZESTORETIC ) 20-25 MG tablet, Take 1 tablet by mouth  daily., Disp: 90 tablet, Rfl: 3   loratadine  (CLARITIN ) 10 MG tablet, TAKE 1 TABLET BY MOUTH EVERYDAY AT BEDTIME, Disp: 90 tablet, Rfl: 3   mupirocin  ointment (BACTROBAN ) 2 %, Apply 1 Application topically 2 (two) times daily for 7 days., Disp: 22 g, Rfl: 0   pravastatin  (PRAVACHOL ) 80 MG tablet, Take 1 tablet (80 mg total) by mouth daily., Disp: 90 tablet, Rfl:  3   prednisoLONE  acetate (PRED FORTE ) 1 % ophthalmic suspension, Put 6 drops in the rinse bottle once a day and rinse 1/2 on each side into the nose, Disp: 10 mL, Rfl: 1   predniSONE  (DELTASONE ) 10 MG tablet, 3 TABS DAILY X 4 DAYS, 2 TABS DAILY X 4 DAYS, 1 TAB DAILY X 4 DAYS *WITH BREAKFAST*, Disp: , Rfl:    sodium chloride  (OCEAN) 0.65 % SOLN Joshua spray, Place 2 sprays into both nostrils as needed for congestion (after any blood tinged drainage stops)., Disp: 30 mL, Rfl: 0   traMADol  (ULTRAM ) 50 MG tablet, TAKE 1 TABLET BY MOUTH EVERY 6 HOURS AS NEEDED FOR UP TO 10 DAYS., Disp: , Rfl:    triamcinolone  ointment (KENALOG ) 0.1 %, APPLY TO AFFECTED AREA TWICE A DAY, Disp: , Rfl:    gatifloxacin (ZYMAXID) 0.5 % SOLN, Place 1 drop into the left eye 4 (four) times daily. (Joshua not taking: Reported on 12/21/2023), Disp: , Rfl:    Physical Exam:   BP 138/76 (BP Location: Left Levine, Joshua Levine, Joshua Size: Large)   Pulse 93   Ht 6' 3 (1.905 m)   SpO2 94%   BMI 30.00 kg/m   Salient findings:  CN II-XII intact Anterior rhinoscopy: septum midline, bilateral turbinates reduced; Joshua endoscopy was indicated to better evaluate the nose and paranasal sinuses, given the Joshua's history and exam findings, and is detailed below. No respiratory distress or stridor  Seprately Identifiable Procedures:  PROCEDURE: Bilateral Diagnostic Rigid Joshua Endoscopy with Bilateral Debridement Pre-procedure diagnosis: Post-operative examination and care after bilateral Functional Endoscopic Sinus Surgery Post-procedure diagnosis: same Indication: See pre-procedure diagnosis and physical exam above Complications: None apparent EBL: <5 mL Anesthesia: Lidocaine  4% and topical decongestant was topically sprayed in each Joshua cavity  Description of Procedure:  Joshua was identified and verbal consent obtained after discussion of R/B/A. A rigid 30 degree endoscope was utilized to evaluate the sinonasal  cavities, mucosa, sinus ostia and turbinates and septum. Overall, signs of mucosal inflammation are modest today. Also noted are post-surgical changes with mild purulence left max and ethmoid and right max as well; bilateral ethmoid cavities with L>R mucosal edema, modest. These were debrided with a 8 Fr suction straight. After debridement, sinus cavity patency was much improved. No adverse synechiae are noted Right Middle meatus: more patent after debridement Right SE Recess: more patent after debridement Left MM: more patent after debridement Left SE Recess: more patent after debridement  CPT CODE -- 68762 - Mod 50      Impression & Plans:  Willey Due is a 73 y.o. male with:  1. Other chronic sinusitis   2. Joshua congestion   3. Hypertrophy of both inferior Joshua turbinates   4. S/P FESS (functional endoscopic sinus surgery)   5. Joshua septal deviation   6. Joshua obstruction   7. Recurrent sinusitis    S/p septoplasty and bilateral FESS April 2025. Overall has done well but with an exacerbation today. After debridement, sinus cavities looked much better so instead of abx PO, will start on pred forte  and  mupirocin  rinses. If he continues to worsen, can consider PO abx.   He is in agreement.  - Continue dymista  BID - Continue rinses BID -- add pred forte  6 drops to each rinse daily and mupirocin  ointment BID to rinses for 7 days. - Can up and down-titrate rinses as needed after - f/u Spring 2026, sooner as necessary  See below regarding exact medications prescribed this encounter including dosages and route: Meds ordered this encounter  Medications   mupirocin  ointment (BACTROBAN ) 2 %    Sig: Apply 1 Application topically 2 (two) times daily for 7 days.    Dispense:  22 g    Refill:  0   prednisoLONE  acetate (PRED FORTE ) 1 % ophthalmic suspension    Sig: Put 6 drops in the rinse bottle once a day and rinse 1/2 on each side into the nose    Dispense:  10 mL    Refill:  1     Thank you for allowing me the opportunity to care for your Joshua. Please do not hesitate to contact me should you have any other questions.  Sincerely, Eldora Blanch, MD Otolaryngologist (ENT), [facility] Phone: [phone] Fax: [phone]  12/24/2023, 12:28 PM   MDM:  Level 4 - 99214 Complexity/Problems addressed: mod - chronic problems, one with exacerbation Data complexity: low - Morbidity: mod - Prescription Drug prescribed or managed: y

## 2024-01-18 ENCOUNTER — Other Ambulatory Visit: Payer: Self-pay | Admitting: Family Medicine

## 2024-01-18 DIAGNOSIS — J309 Allergic rhinitis, unspecified: Secondary | ICD-10-CM

## 2024-01-19 ENCOUNTER — Ambulatory Visit: Admitting: Family Medicine

## 2024-01-19 ENCOUNTER — Encounter: Payer: Self-pay | Admitting: Family Medicine

## 2024-01-19 VITALS — BP 134/74 | HR 68 | Temp 98.5°F | Resp 16 | Ht 75.0 in | Wt 240.0 lb

## 2024-01-19 DIAGNOSIS — R7303 Prediabetes: Secondary | ICD-10-CM | POA: Diagnosis not present

## 2024-01-19 DIAGNOSIS — J329 Chronic sinusitis, unspecified: Secondary | ICD-10-CM | POA: Diagnosis not present

## 2024-01-19 DIAGNOSIS — J4530 Mild persistent asthma, uncomplicated: Secondary | ICD-10-CM | POA: Diagnosis not present

## 2024-01-19 DIAGNOSIS — I10 Essential (primary) hypertension: Secondary | ICD-10-CM | POA: Diagnosis not present

## 2024-01-19 DIAGNOSIS — R29818 Other symptoms and signs involving the nervous system: Secondary | ICD-10-CM | POA: Diagnosis not present

## 2024-01-19 DIAGNOSIS — J309 Allergic rhinitis, unspecified: Secondary | ICD-10-CM

## 2024-01-19 DIAGNOSIS — E785 Hyperlipidemia, unspecified: Secondary | ICD-10-CM | POA: Diagnosis not present

## 2024-01-19 LAB — COMPREHENSIVE METABOLIC PANEL WITH GFR
ALT: 31 U/L (ref 3–53)
AST: 26 U/L (ref 5–37)
Albumin: 4.9 g/dL (ref 3.5–5.2)
Alkaline Phosphatase: 54 U/L (ref 39–117)
BUN: 22 mg/dL (ref 6–23)
CO2: 27 meq/L (ref 19–32)
Calcium: 10 mg/dL (ref 8.4–10.5)
Chloride: 102 meq/L (ref 96–112)
Creatinine, Ser: 1.15 mg/dL (ref 0.40–1.50)
GFR: 63.82 mL/min
Glucose, Bld: 110 mg/dL — ABNORMAL HIGH (ref 70–99)
Potassium: 4.6 meq/L (ref 3.5–5.1)
Sodium: 138 meq/L (ref 135–145)
Total Bilirubin: 0.3 mg/dL (ref 0.2–1.2)
Total Protein: 7.5 g/dL (ref 6.0–8.3)

## 2024-01-19 LAB — LIPID PANEL
Cholesterol: 142 mg/dL (ref 28–200)
HDL: 34.9 mg/dL — ABNORMAL LOW
LDL Cholesterol: 59 mg/dL (ref 10–99)
NonHDL: 107.37
Total CHOL/HDL Ratio: 4
Triglycerides: 242 mg/dL — ABNORMAL HIGH (ref 10.0–149.0)
VLDL: 48.4 mg/dL — ABNORMAL HIGH (ref 0.0–40.0)

## 2024-01-19 LAB — HEMOGLOBIN A1C: Hgb A1c MFr Bld: 6.1 % (ref 4.6–6.5)

## 2024-01-19 MED ORDER — AZELASTINE-FLUTICASONE 137-50 MCG/ACT NA SUSP: 1.0000 | Freq: Two times a day (BID) | NASAL | 2 refills | Status: AC

## 2024-01-19 NOTE — Patient Instructions (Addendum)
 I will refer you to neurology to discuss the balance concerns and decide on other testing.  See information below on fall prevention at home.  Continue to be cautious with gait, balance, and avoid ladders or stepstools for now.  Be seen if any acute change or worsening of symptoms.  Thank you for coming in today. No change in medications at this time. If there are any concerns on your bloodwork, I will let you know. Take care!  Fall Prevention in the Home, Adult Falls can cause injuries and affect people of all ages. There are many simple things that you can do to make your home safe and to help prevent falls. If you need it, ask for help making these changes. What actions can I take to prevent falls? General information Use good lighting in all rooms. Make sure to: Replace any light bulbs that burn out. Turn on lights if it is dark and use night-lights. Keep items that you use often in easy-to-reach places. Lower the shelves around your home if needed. Move furniture so that there are clear paths around it. Do not keep throw rugs or other things on the floor that can make you trip. If any of your floors are uneven, fix them. Add color or contrast paint or tape to clearly mark and help you see: Grab bars or handrails. First and last steps of staircases. Where the edge of each step is. If you use a ladder or stepladder: Make sure that it is fully opened. Do not climb a closed ladder. Make sure the sides of the ladder are locked in place. Have someone hold the ladder while you use it. Know where your pets are as you move through your home. What can I do in the bathroom?     Keep the floor dry. Clean up any water that is on the floor right away. Remove soap buildup in the bathtub or shower. Buildup makes bathtubs and showers slippery. Use non-skid mats or decals on the floor of the bathtub or shower. Attach bath mats securely with double-sided, non-slip rug tape. If you need to sit down  while you are in the shower, use a non-slip stool. Install grab bars by the toilet and in the bathtub and shower. Do not use towel bars as grab bars. What can I do in the bedroom? Make sure that you have a light by your bed that is easy to reach. Do not use any sheets or blankets on your bed that hang to the floor. Have a firm bench or chair with side arms that you can use for support when you get dressed. What can I do in the kitchen? Clean up any spills right away. If you need to reach something above you, use a sturdy step stool that has a grab bar. Keep electrical cables out of the way. Do not use floor polish or wax that makes floors slippery. What can I do with my stairs? Do not leave anything on the stairs. Make sure that you have a light switch at the top and the bottom of the stairs. Have them installed if you do not have them. Make sure that there are handrails on both sides of the stairs. Fix handrails that are broken or loose. Make sure that handrails are as long as the staircases. Install non-slip stair treads on all stairs in your home if they do not have carpet. Avoid having throw rugs at the top or bottom of stairs, or secure the rugs  with carpet tape to prevent them from moving. Choose a carpet design that does not hide the edge of steps on the stairs. Make sure that carpet is firmly attached to the stairs. Fix any carpet that is loose or worn. What can I do on the outside of my home? Use bright outdoor lighting. Repair the edges of walkways and driveways and fix any cracks. Clear paths of anything that can make you trip, such as tools or rocks. Add color or contrast paint or tape to clearly mark and help you see high doorway thresholds. Trim any bushes or trees on the main path into your home. Check that handrails are securely fastened and in good repair. Both sides of all steps should have handrails. Install guardrails along the edges of any raised decks or porches. Have  leaves, snow, and ice cleared regularly. Use sand, salt, or ice melt on walkways during winter months if you live where there is ice and snow. In the garage, clean up any spills right away, including grease or oil spills. What other actions can I take? Review your medicines with your health care provider. Some medicines can make you confused or feel dizzy. This can increase your chance of falling. Wear closed-toe shoes that fit well and support your feet. Wear shoes that have rubber soles and low heels. Use a cane, walker, scooter, or crutches that help you move around if needed. Talk with your provider about other ways that you can decrease your risk of falls. This may include seeing a physical therapist to learn to do exercises to improve movement and strength. Where to find more information Centers for Disease Control and Prevention, [employer]: tonerpromos.no [employer] on Aging: baseringtones.pl National Institute on Aging: baseringtones.pl Contact a health care provider if: You are afraid of falling at home. You feel weak, drowsy, or dizzy at home. You fall at home. Get help right away if you: Lose consciousness or have trouble moving after a fall. Have a fall that causes a head injury. These symptoms may be an emergency. Get help right away. Call 911. Do not wait to see if the symptoms will go away. Do not drive yourself to the hospital. This information is not intended to replace advice given to you by your health care provider. Make sure you discuss any questions you have with your health care provider. Document Revised: 09/20/2021 Document Reviewed: 09/20/2021 Elsevier Patient Education  2024 [employer].

## 2024-01-19 NOTE — Progress Notes (Signed)
 "  Subjective:  Patient ID: Joshua Levine, male    DOB: [birth year]  Age: 73 y.o. MRN: [id number]  CC:  Chief Complaint  Patient presents with   Follow-up    Routine, Fasting Concern- increased balance concerns, denies falls Flu shot and covid- done    HPI Joshua Levine presents for   Asthma Stable with Advair  2 puffs twice daily.  Albuterol  as needed, rare need - few weeks.  Chronic sinusitis Followed by ENT, visit November 20 with Dr. Tobie.  Started on Pred forte ., mupirocin  ointment.  Claritin , azelastine  fluticasone  nasal spray.doing ok.   Hypertension: Lisinopril  HCTZ 20/25 mg daily.  Referred to sleep specialist with snoring discussed in June.  Sleep testing indicated mild OSA, AHI 13.4.  Worse when laying on his back.  CPAP treatment discussed, he declined. Home readings:none.  BP Readings from Last 3 Encounters:  01/19/24 134/74  12/21/23 138/76  08/24/23 (!) 149/81   Lab Results  Component Value Date   CREATININE 1.13 07/20/2023   Hyperlipidemia: Pravastatin  80 mg daily without any myalgias or side effects. Lab Results  Component Value Date   CHOL 161 07/20/2023   HDL 34.80 (L) 07/20/2023   LDLCALC 56 07/20/2023   LDLDIRECT 100.0 07/08/2022   TRIG 350.0 (H) 07/20/2023   CHOLHDL 5 07/20/2023   Lab Results  Component Value Date   ALT 33 07/20/2023   AST 27 07/20/2023   ALKPHOS 64 07/20/2023   BILITOT 0.4 07/20/2023   Prediabetes: Diet/exercise approach.  Weight is stable.  Stress eating discussed previously. Doing better.  Lab Results  Component Value Date   HGBA1C 6.2 07/20/2023   Wt Readings from Last 3 Encounters:  01/19/24 240 lb (108.9 kg)  08/24/23 240 lb (108.9 kg)  07/20/23 248 lb (112.5 kg)   Balance difficulty: Not a new issue. Has been present for a few years, more noticeable. No falls. Feels like balance is not quite there. Has to be careful to avoid falls. Unsteady on step stool. No vertigo/spinning sensation. Feels like leans when  walking at times, or lurching gait. No focal weakness. No new HA, no slurred speech. No hx of CVA. Does have chronic sinusitis, knees are stiff - bilateral knee OA, injections by ortho in 11/2023 - helps with pain, no change in balance/stiffness/stability.    History Patient Active Problem List   Diagnosis Date Noted   OSA (obstructive sleep apnea) 09/20/2023   Snoring 08/24/2023   At risk for obstructive sleep apnea 08/24/2023   Primary hypertension 08/24/2023   Nasal septal deviation 05/08/2023   Hypertrophy of both inferior nasal turbinates 05/08/2023   Family history of factor V Leiden mutation 11/06/2019   Essential hypertension 12/10/2011   Hyperlipidemia 12/10/2011   Erectile dysfunction 12/10/2011   Past Medical History:  Diagnosis Date   Allergy    Arthritis    Basal cell carcinoma    Erectile dysfunction    Hyperlipidemia    Hypertension    Past Surgical History:  Procedure Laterality Date   cataract Bilateral    ETHMOIDECTOMY Bilateral 05/08/2023   Procedure: ETHMOIDECTOMY;  Surgeon: Tobie Eldora NOVAK, MD;  Location: [facility];  Service: ENT;  Laterality: Bilateral;   FRONTAL SINUS EXPLORATION Left 05/08/2023   Procedure: FRONTAL SINUS EXPLORATION;  Surgeon: Tobie Eldora NOVAK, MD;  Location: [facility];  Service: ENT;  Laterality: Left;   KNEE SURGERY     LUMBAR DISC SURGERY     MAXILLARY ANTROSTOMY Bilateral 05/08/2023   Procedure: MAXILLARY  ANTROSTOMY;  Surgeon: Tobie Eldora NOVAK, MD;  Location: [facility];  Service: ENT;  Laterality: Bilateral;   NASAL SEPTOPLASTY W/ TURBINOPLASTY Bilateral 05/08/2023   Procedure: NASAL SEPTOPLASTY WITH INFERIOR TURBINATE REDUCTION;  Surgeon: Tobie Eldora NOVAK, MD;  Location: [facility];  Service: ENT;  Laterality: Bilateral;   SINUS ENDO W/FUSION Bilateral 05/08/2023   Procedure: ENDOSCOPIC SINUS SURGERY WITH STEALTH NAVIGATION;  Surgeon: Tobie Eldora NOVAK, MD;  Location: [facility];  Service: ENT;  Laterality: Bilateral;   SPINE SURGERY     Allergies[1] Prior to Admission medications  Medication Sig Start Date End Date Taking? Authorizing Provider  acetaminophen  (TYLENOL ) 500 MG tablet Take 2 tablets (1,000 mg total) by mouth every 6 (six) hours as needed. 05/08/23  Yes Tobie Eldora NOVAK, MD  albuterol  (VENTOLIN  HFA) 108 (90 Base) MCG/ACT inhaler Inhale 1-2 puffs into the lungs every 6 (six) hours as needed for wheezing or shortness of breath. 07/20/23  Yes Levora Reyes SAUNDERS, MD  ALPRAZolam  (XANAX ) 0.25 MG tablet Take 1 tablet (0.25 mg total) by mouth at bedtime as needed for sleep (for sleep lab -premedication as needed). 09/20/23  Yes Dohmeier, Dedra, MD  Ascorbic Acid (VITAMIN C) 100 MG tablet Take 100 mg by mouth daily.    Yes [provider]  Azelastine -Fluticasone  137-50 MCG/ACT SUSP PLACE 1 SPRAY INTO THE NOSE EVERY 12 (TWELVE) HOURS. 01/18/24  Yes Levora Reyes SAUNDERS, MD  celecoxib (CELEBREX) 200 MG capsule Take 200 mg by mouth daily as needed. 07/20/23  Yes [provider]  fluticasone -salmeterol (ADVAIR  HFA) 115-21 MCG/ACT inhaler Inhale 2 puffs into the lungs 2 (two) times daily. 07/20/23  Yes Levora Reyes SAUNDERS, MD  glucosamine-chondroitin 500-400 MG tablet Take 1 tablet by mouth 3 (three) times daily.   Yes [provider]  lisinopril -hydrochlorothiazide  (ZESTORETIC ) 20-25 MG tablet Take 1 tablet by mouth daily. 07/20/23  Yes Levora Reyes SAUNDERS, MD  loratadine  (CLARITIN ) 10 MG tablet TAKE 1 TABLET BY MOUTH EVERYDAY AT BEDTIME 07/20/23  Yes Levora Reyes SAUNDERS, MD  pravastatin  (PRAVACHOL ) 80 MG tablet Take 1 tablet (80 mg total) by mouth daily. 07/20/23  Yes Levora Reyes SAUNDERS, MD  prednisoLONE  acetate (PRED FORTE ) 1 % ophthalmic suspension Put 6 drops in the rinse bottle once a day and rinse 1/2 on each side into the nose 12/21/23  Yes Tobie Eldora NOVAK, MD  sodium chloride  (OCEAN) 0.65 % SOLN nasal spray Place 2 sprays into both nostrils  as needed for congestion (after any blood tinged drainage stops). 05/08/23  Yes Patel, Kunjan B, MD  gatifloxacin (ZYMAXID) 0.5 % SOLN Place 1 drop into the left eye 4 (four) times daily. Patient not taking: Reported on 01/19/2024 04/26/22   [provider]  predniSONE  (DELTASONE ) 10 MG tablet 3 TABS DAILY X 4 DAYS, 2 TABS DAILY X 4 DAYS, 1 TAB DAILY X 4 DAYS *WITH BREAKFAST* Patient not taking: Reported on 01/19/2024    [provider]  traMADol  (ULTRAM ) 50 MG tablet TAKE 1 TABLET BY MOUTH EVERY 6 HOURS AS NEEDED FOR UP TO 10 DAYS. Patient not taking: Reported on 01/19/2024    [provider]  triamcinolone  ointment (KENALOG ) 0.1 % APPLY TO AFFECTED AREA TWICE A DAY Patient not taking: Reported on 01/19/2024    [provider]   Social History   Socioeconomic History   Marital status: Married    Spouse name: Not on file   Number of children: Not on file   Years of  education: Not on file   Highest education level: Associate degree: occupational, scientist, product/process development, or vocational program  Occupational History   Occupation: Radiation Protection Practitioner: [employer]  Tobacco Use   Smoking status: Former   Smokeless tobacco: Never  Substance and Sexual Activity   Alcohol use: No   Drug use: No   Sexual activity: Yes  Other Topics Concern   Not on file  Social History Narrative   Married. Education: [employer]. Exercise: Walk 2 times a week for 45 minutes.   Social Drivers of Health   Tobacco Use: Medium Risk (01/19/2024)   Patient History    Smoking Tobacco Use: Former    Smokeless Tobacco Use: Never    Passive Exposure: Not on file  Financial Resource Strain: Low Risk (01/15/2024)   Overall Financial Resource Strain (CARDIA)    Difficulty of Paying Living Expenses: Not hard at all  Food Insecurity: No Food Insecurity (01/15/2024)   Epic    Worried About Programme Researcher, Broadcasting/film/video in the Last Year: Never true    Ran Out of Food in the Last Year: Never true   Transportation Needs: No Transportation Needs (01/15/2024)   Epic    Lack of Transportation (Medical): No    Lack of Transportation (Non-Medical): No  Physical Activity: Insufficiently Active (01/15/2024)   Exercise Vital Sign    Days of Exercise per Week: 4 days    Minutes of Exercise per Session: 20 min  Stress: No Stress Concern Present (01/15/2024)   [employer] of Occupational Health - Occupational Stress Questionnaire    Feeling of Stress: Not at all  Social Connections: Moderately Isolated (01/15/2024)   Social Connection and Isolation Panel    Frequency of Communication with Friends and Family: More than three times a week    Frequency of Social Gatherings with Friends and Family: Once a week    Attends Religious Services: Never    Database Administrator or Organizations: No    Attends Engineer, Structural: Not on file    Marital Status: Married  Intimate Partner Violence: Unknown (08/31/2021)   Received from [facility]   HITS    Physically Hurt: Not on file    Insult or Talk Down To: Not on file    Threaten Physical Harm: Not on file    Scream or Curse: Not on file  Depression (PHQ2-9): Low Risk (01/19/2024)   Depression (PHQ2-9)    PHQ-2 Score: 2  Alcohol Screen: Not on file  Housing: Low Risk (01/15/2024)   Epic    Unable to Pay for Housing in the Last Year: No    Number of Times Moved in the Last Year: 0    Homeless in the Last Year: No  Utilities: Not on file  Health Literacy: Not on file    Review of Systems  Constitutional:  Negative for fatigue and unexpected weight change.  Eyes:  Negative for visual disturbance.  Respiratory:  Negative for cough, chest tightness and shortness of breath.   Cardiovascular:  Negative for chest pain, palpitations and leg swelling.  Gastrointestinal:  Negative for abdominal pain and blood in stool.  Neurological:  Negative for dizziness, light-headedness and headaches.     Objective:   Vitals:    01/19/24 0752  BP: 134/74  Pulse: 68  Resp: 16  Temp: 98.5 F (36.9 C)  TempSrc: Oral  SpO2: 97%  Weight: 240 lb (108.9 kg)  Height: 6' 3 (1.905 m)     Physical Exam  Vitals reviewed.  Constitutional:      Appearance: He is well-developed.  HENT:     Head: Normocephalic and atraumatic.  Neck:     Vascular: No carotid bruit or JVD.  Cardiovascular:     Rate and Rhythm: Normal rate and regular rhythm.     Heart sounds: Normal heart sounds. No murmur heard. Pulmonary:     Effort: Pulmonary effort is normal.     Breath sounds: Normal breath sounds. No rales.  Musculoskeletal:     Right lower leg: No edema.     Left lower leg: No edema.  Skin:    General: Skin is warm and dry.  Neurological:     General: No focal deficit present.     Mental Status: He is alert and oriented to person, place, and time. Mental status is at baseline.     Motor: No weakness.     Gait: Gait abnormal (Somewhat cautious but ambulating without assistive device.  Does not appear to have a wide-based or shuffling gait.).     Comments: No pronator drift, normal RAM's, normal heel-to-shin, normal finger-nose.  No focal weakness.  Unsteady with Romberg then self corrects.  Psychiatric:        Mood and Affect: Mood normal.        Assessment & Plan:  Joshua Levine is a 73 y.o. male . Allergic rhinitis, unspecified seasonality, unspecified trigger - Plan: Azelastine -Fluticasone  137-50 MCG/ACT SUSP Chronic sinusitis, unspecified location  - Continue follow-up with ENT, continue same med regimen.  Some improvement with recent changes.  Essential hypertension - Plan: Comprehensive metabolic panel with GFR  - Stable with current med regimen, has refills, check labs and adjust plan accordingly  Hyperlipidemia, unspecified hyperlipidemia type - Plan: Comprehensive metabolic panel with GFR, Lipid panel  - Tolerating current med regimen, check labs and adjust plan accordingly  Mild persistent asthma  without complication  - Stable with Advair , has albuterol  if needed with RTC precautions if flare or increased use  Prediabetes - Plan: Hemoglobin A1c  - Weight stable, check A1c and adjust plan accordingly.  Continue to watch diet, food choices.  Difficulty balancing - Plan: Ambulatory referral to Neurology  - Slightly cautious gait.  Denies falls.  No focal weakness appreciated.  Longstanding symptoms but appears to be more frequent concern.  Bilateral knee OA could be contributing as well as chronic sinusitis but will refer to neurology for further evaluation, handout given on fall prevention at home with RTC/ER precautions discussed.  Meds ordered this encounter  Medications   Azelastine -Fluticasone  137-50 MCG/ACT SUSP    Sig: Place 1 spray into the nose every 12 (twelve) hours.    Dispense:  23 g    Refill:  2   Patient Instructions  I will refer you to neurology to discuss the balance concerns and decide on other testing.  See information below on fall prevention at home.  Continue to be cautious with gait, balance, and avoid ladders or stepstools for now.  Be seen if any acute change or worsening of symptoms.  Thank you for coming in today. No change in medications at this time. If there are any concerns on your bloodwork, I will let you know. Take care!  Fall Prevention in the Home, Adult Falls can cause injuries and affect people of all ages. There are many simple things that you can do to make your home safe and to help prevent falls. If you need it, ask for help making these changes. What  actions can I take to prevent falls? General information Use good lighting in all rooms. Make sure to: Replace any light bulbs that burn out. Turn on lights if it is dark and use night-lights. Keep items that you use often in easy-to-reach places. Lower the shelves around your home if needed. Move furniture so that there are clear paths around it. Do not keep throw rugs or other things on  the floor that can make you trip. If any of your floors are uneven, fix them. Add color or contrast paint or tape to clearly mark and help you see: Grab bars or handrails. First and last steps of staircases. Where the edge of each step is. If you use a ladder or stepladder: Make sure that it is fully opened. Do not climb a closed ladder. Make sure the sides of the ladder are locked in place. Have someone hold the ladder while you use it. Know where your pets are as you move through your home. What can I do in the bathroom?     Keep the floor dry. Clean up any water that is on the floor right away. Remove soap buildup in the bathtub or shower. Buildup makes bathtubs and showers slippery. Use non-skid mats or decals on the floor of the bathtub or shower. Attach bath mats securely with double-sided, non-slip rug tape. If you need to sit down while you are in the shower, use a non-slip stool. Install grab bars by the toilet and in the bathtub and shower. Do not use towel bars as grab bars. What can I do in the bedroom? Make sure that you have a light by your bed that is easy to reach. Do not use any sheets or blankets on your bed that hang to the floor. Have a firm bench or chair with side arms that you can use for support when you get dressed. What can I do in the kitchen? Clean up any spills right away. If you need to reach something above you, use a sturdy step stool that has a grab bar. Keep electrical cables out of the way. Do not use floor polish or wax that makes floors slippery. What can I do with my stairs? Do not leave anything on the stairs. Make sure that you have a light switch at the top and the bottom of the stairs. Have them installed if you do not have them. Make sure that there are handrails on both sides of the stairs. Fix handrails that are broken or loose. Make sure that handrails are as long as the staircases. Install non-slip stair treads on all stairs in your  home if they do not have carpet. Avoid having throw rugs at the top or bottom of stairs, or secure the rugs with carpet tape to prevent them from moving. Choose a carpet design that does not hide the edge of steps on the stairs. Make sure that carpet is firmly attached to the stairs. Fix any carpet that is loose or worn. What can I do on the outside of my home? Use bright outdoor lighting. Repair the edges of walkways and driveways and fix any cracks. Clear paths of anything that can make you trip, such as tools or rocks. Add color or contrast paint or tape to clearly mark and help you see high doorway thresholds. Trim any bushes or trees on the main path into your home. Check that handrails are securely fastened and in good repair. Both sides of all steps should have  handrails. Install guardrails along the edges of any raised decks or porches. Have leaves, snow, and ice cleared regularly. Use sand, salt, or ice melt on walkways during winter months if you live where there is ice and snow. In the garage, clean up any spills right away, including grease or oil spills. What other actions can I take? Review your medicines with your health care provider. Some medicines can make you confused or feel dizzy. This can increase your chance of falling. Wear closed-toe shoes that fit well and support your feet. Wear shoes that have rubber soles and low heels. Use a cane, walker, scooter, or crutches that help you move around if needed. Talk with your provider about other ways that you can decrease your risk of falls. This may include seeing a physical therapist to learn to do exercises to improve movement and strength. Where to find more information Centers for Disease Control and Prevention, [employer]: tonerpromos.no [employer] on Aging: baseringtones.pl National Institute on Aging: baseringtones.pl Contact a health care provider if: You are afraid of falling at home. You feel weak, drowsy, or dizzy at home. You  fall at home. Get help right away if you: Lose consciousness or have trouble moving after a fall. Have a fall that causes a head injury. These symptoms may be an emergency. Get help right away. Call 911. Do not wait to see if the symptoms will go away. Do not drive yourself to the hospital. This information is not intended to replace advice given to you by your health care provider. Make sure you discuss any questions you have with your health care provider. Document Revised: 09/20/2021 Document Reviewed: 09/20/2021 Elsevier Patient Education  2024 Elsevier Inc.    Signed,   Reyes Pines, MD [facility], [facility] 01/19/2024 8:59 AM      [1] No Known Allergies  "

## 2024-01-22 ENCOUNTER — Ambulatory Visit: Payer: Self-pay | Admitting: Family Medicine

## 2024-05-06 ENCOUNTER — Ambulatory Visit (INDEPENDENT_AMBULATORY_CARE_PROVIDER_SITE_OTHER): Admitting: Otolaryngology

## 2024-06-18 ENCOUNTER — Ambulatory Visit: Admitting: Neurology

## 2024-07-31 ENCOUNTER — Encounter: Admitting: Family Medicine
